# Patient Record
Sex: Female | Born: 1935 | Race: White | Hispanic: No | Smoking: Former smoker
Health system: Southern US, Community
[De-identification: ages and names within clinical notes are randomized; demographics above are authoritative.]

## PROBLEM LIST (undated history)

## (undated) DIAGNOSIS — E039 Hypothyroidism, unspecified: Secondary | ICD-10-CM

## (undated) DIAGNOSIS — K219 Gastro-esophageal reflux disease without esophagitis: Secondary | ICD-10-CM

## (undated) DIAGNOSIS — I10 Essential (primary) hypertension: Secondary | ICD-10-CM

## (undated) DIAGNOSIS — T7840XA Allergy, unspecified, initial encounter: Secondary | ICD-10-CM

## (undated) DIAGNOSIS — I700 Atherosclerosis of aorta: Secondary | ICD-10-CM

## (undated) DIAGNOSIS — I341 Nonrheumatic mitral (valve) prolapse: Secondary | ICD-10-CM

## (undated) DIAGNOSIS — H269 Unspecified cataract: Secondary | ICD-10-CM

## (undated) DIAGNOSIS — M1990 Unspecified osteoarthritis, unspecified site: Secondary | ICD-10-CM

## (undated) HISTORY — DX: Unspecified osteoarthritis, unspecified site: M19.90

## (undated) HISTORY — PX: FRACTURE SURGERY: SHX138

## (undated) HISTORY — PX: EYE SURGERY: SHX253

## (undated) HISTORY — DX: Essential (primary) hypertension: I10

## (undated) HISTORY — PX: COSMETIC SURGERY: SHX468

## (undated) HISTORY — PX: CATARACT EXTRACTION W/ INTRAOCULAR LENS IMPLANT: SHX1309

## (undated) HISTORY — PX: EYE MUSCLE SURGERY: SHX370

## (undated) HISTORY — DX: Allergy, unspecified, initial encounter: T78.40XA

## (undated) HISTORY — DX: Unspecified cataract: H26.9

---

## 1997-07-14 ENCOUNTER — Emergency Department (HOSPITAL_COMMUNITY): Admission: EM | Admit: 1997-07-14 | Discharge: 1997-07-14 | Payer: Self-pay | Admitting: Emergency Medicine

## 1998-12-19 ENCOUNTER — Ambulatory Visit (HOSPITAL_COMMUNITY): Admission: RE | Admit: 1998-12-19 | Discharge: 1998-12-19 | Payer: Self-pay | Admitting: Gastroenterology

## 2000-02-27 ENCOUNTER — Encounter: Admission: RE | Admit: 2000-02-27 | Discharge: 2000-02-27 | Payer: Self-pay | Admitting: Emergency Medicine

## 2000-02-27 ENCOUNTER — Encounter: Payer: Self-pay | Admitting: Emergency Medicine

## 2001-05-28 ENCOUNTER — Encounter: Payer: Self-pay | Admitting: Emergency Medicine

## 2001-05-28 ENCOUNTER — Encounter: Admission: RE | Admit: 2001-05-28 | Discharge: 2001-05-28 | Payer: Self-pay | Admitting: Emergency Medicine

## 2003-04-03 ENCOUNTER — Ambulatory Visit (HOSPITAL_COMMUNITY): Admission: RE | Admit: 2003-04-03 | Discharge: 2003-04-03 | Payer: Self-pay | Admitting: Gastroenterology

## 2003-04-03 ENCOUNTER — Encounter (INDEPENDENT_AMBULATORY_CARE_PROVIDER_SITE_OTHER): Payer: Self-pay | Admitting: *Deleted

## 2003-08-15 ENCOUNTER — Encounter: Admission: RE | Admit: 2003-08-15 | Discharge: 2003-08-15 | Payer: Self-pay | Admitting: Emergency Medicine

## 2004-09-13 ENCOUNTER — Observation Stay (HOSPITAL_COMMUNITY): Admission: RE | Admit: 2004-09-13 | Discharge: 2004-09-14 | Payer: Self-pay | Admitting: Urology

## 2005-04-15 ENCOUNTER — Encounter: Admission: RE | Admit: 2005-04-15 | Discharge: 2005-04-15 | Payer: Self-pay | Admitting: Emergency Medicine

## 2005-08-12 ENCOUNTER — Encounter (INDEPENDENT_AMBULATORY_CARE_PROVIDER_SITE_OTHER): Payer: Self-pay | Admitting: *Deleted

## 2005-08-13 ENCOUNTER — Inpatient Hospital Stay (HOSPITAL_COMMUNITY): Admission: RE | Admit: 2005-08-13 | Discharge: 2005-08-17 | Payer: Self-pay | Admitting: Urology

## 2005-08-22 ENCOUNTER — Ambulatory Visit (HOSPITAL_COMMUNITY): Admission: RE | Admit: 2005-08-22 | Discharge: 2005-08-22 | Payer: Self-pay | Admitting: Urology

## 2006-01-22 ENCOUNTER — Encounter (INDEPENDENT_AMBULATORY_CARE_PROVIDER_SITE_OTHER): Payer: Self-pay | Admitting: Specialist

## 2006-01-22 ENCOUNTER — Ambulatory Visit (HOSPITAL_COMMUNITY): Admission: RE | Admit: 2006-01-22 | Discharge: 2006-01-23 | Payer: Self-pay | Admitting: Urology

## 2006-01-30 ENCOUNTER — Ambulatory Visit (HOSPITAL_COMMUNITY): Admission: RE | Admit: 2006-01-30 | Discharge: 2006-01-30 | Payer: Self-pay | Admitting: Urology

## 2006-07-06 ENCOUNTER — Encounter: Admission: RE | Admit: 2006-07-06 | Discharge: 2006-07-06 | Payer: Self-pay | Admitting: Gastroenterology

## 2006-07-22 ENCOUNTER — Encounter: Admission: RE | Admit: 2006-07-22 | Discharge: 2006-07-22 | Payer: Self-pay | Admitting: Emergency Medicine

## 2007-02-24 ENCOUNTER — Encounter: Admission: RE | Admit: 2007-02-24 | Discharge: 2007-02-24 | Payer: Self-pay | Admitting: Orthopedic Surgery

## 2007-02-25 ENCOUNTER — Ambulatory Visit (HOSPITAL_BASED_OUTPATIENT_CLINIC_OR_DEPARTMENT_OTHER): Admission: RE | Admit: 2007-02-25 | Discharge: 2007-02-26 | Payer: Self-pay | Admitting: Orthopedic Surgery

## 2007-09-22 ENCOUNTER — Encounter: Admission: RE | Admit: 2007-09-22 | Discharge: 2007-09-22 | Payer: Self-pay | Admitting: Emergency Medicine

## 2009-06-23 ENCOUNTER — Ambulatory Visit: Payer: Self-pay | Admitting: Family Medicine

## 2009-06-23 ENCOUNTER — Observation Stay (HOSPITAL_COMMUNITY): Admission: EM | Admit: 2009-06-23 | Discharge: 2009-06-23 | Payer: Self-pay | Admitting: Emergency Medicine

## 2010-02-25 ENCOUNTER — Encounter: Payer: Self-pay | Admitting: Family Medicine

## 2010-04-22 LAB — COMPREHENSIVE METABOLIC PANEL
ALT: 18 U/L (ref 0–35)
AST: 20 U/L (ref 0–37)
Albumin: 3.4 g/dL — ABNORMAL LOW (ref 3.5–5.2)
Alkaline Phosphatase: 42 U/L (ref 39–117)
BUN: 13 mg/dL (ref 6–23)
CO2: 26 mEq/L (ref 19–32)
Calcium: 9.1 mg/dL (ref 8.4–10.5)
Chloride: 108 mEq/L (ref 96–112)
Creatinine, Ser: 0.8 mg/dL (ref 0.4–1.2)
GFR calc Af Amer: >60 mL/min (ref >60)
GFR calc non Af Amer: >60 mL/min (ref >60)
Glucose, Bld: 102 mg/dL — ABNORMAL HIGH (ref 70–99)
Potassium: 3.8 mEq/L (ref 3.5–5.1)
Sodium: 141 mEq/L (ref 135–145)
Total Bilirubin: 0.5 mg/dL (ref 0.3–1.2)
Total Protein: 5.9 g/dL — ABNORMAL LOW (ref 6.0–8.3)

## 2010-04-22 LAB — DIFFERENTIAL
Basophils Absolute: 0 10*3/uL (ref 0.0–0.1)
Basophils Relative: 0 % (ref 0–1)
Eosinophils Absolute: 0.1 10*3/uL (ref 0.0–0.7)
Eosinophils Relative: 1 % (ref 0–5)
Lymphs Abs: 2.6 10*3/uL (ref 0.7–4.0)
Monocytes Absolute: 0.7 10*3/uL (ref 0.1–1.0)
Monocytes Relative: 9 % (ref 3–12)
Neutro Abs: 4.7 10*3/uL (ref 1.7–7.7)

## 2010-04-22 LAB — CBC
HCT: 35.8 % — ABNORMAL LOW (ref 36.0–46.0)
Hemoglobin: 12.3 g/dL (ref 12.0–15.0)
MCHC: 34.4 g/dL (ref 30.0–36.0)
MCV: 90.6 fL (ref 78.0–100.0)
MCV: 91.1 fL (ref 78.0–100.0)
Platelets: 194 10*3/uL (ref 150–400)
RBC: 3.93 MIL/uL (ref 3.87–5.11)
RDW: 13.6 % (ref 11.5–15.5)
RDW: 13.6 % (ref 11.5–15.5)
WBC: 6.1 10*3/uL (ref 4.0–10.5)

## 2010-04-22 LAB — LIPID PANEL
Cholesterol: 153 mg/dL (ref 0–200)
HDL: 58 mg/dL (ref >39)
LDL Cholesterol: 82 mg/dL (ref 0–99)
Total CHOL/HDL Ratio: 2.6 RATIO
Triglycerides: 66 mg/dL (ref <150)
VLDL: 13 mg/dL (ref 0–40)

## 2010-04-22 LAB — CARDIAC PANEL(CRET KIN+CKTOT+MB+TROPI)
CK, MB: 0.9 ng/mL (ref 0.3–4.0)
CK, MB: 1 ng/mL (ref 0.3–4.0)
Relative Index: INVALID (ref 0.0–2.5)
Relative Index: INVALID (ref 0.0–2.5)
Total CK: 46 U/L (ref 7–177)
Total CK: 50 U/L (ref 7–177)
Troponin I: 0.01 ng/mL (ref 0.00–0.06)
Troponin I: 0.01 ng/mL (ref 0.00–0.06)

## 2010-04-22 LAB — CK TOTAL AND CKMB (NOT AT ARMC): CK, MB: 1 ng/mL (ref 0.3–4.0)

## 2010-04-22 LAB — TSH: TSH: 4.679 u[IU]/mL — ABNORMAL HIGH (ref 0.350–4.500)

## 2010-04-22 LAB — POCT I-STAT, CHEM 8
BUN: 18 mg/dL (ref 6–23)
Creatinine, Ser: 0.8 mg/dL (ref 0.4–1.2)
Glucose, Bld: 92 mg/dL (ref 70–99)
Potassium: 3.5 mEq/L (ref 3.5–5.1)
Sodium: 139 mEq/L (ref 135–145)

## 2010-04-22 LAB — HEMOGLOBIN A1C
Hgb A1c MFr Bld: 5.8 % — ABNORMAL HIGH (ref <5.7)
Mean Plasma Glucose: 120 mg/dL — ABNORMAL HIGH (ref <117)

## 2010-04-22 LAB — BRAIN NATRIURETIC PEPTIDE: Pro B Natriuretic peptide (BNP): <30 pg/mL (ref 0.0–100.0)

## 2010-04-22 LAB — POCT CARDIAC MARKERS: Myoglobin, poc: 49.5 ng/mL (ref 12–200)

## 2010-04-22 LAB — D-DIMER, QUANTITATIVE: D-Dimer, Quant: 0.33 ug/mL-FEU (ref 0.00–0.48)

## 2010-06-18 NOTE — Op Note (Signed)
NAME:  Vanessa Melton, Vanessa Melton      ACCOUNT NO.:  [id number]   MEDICAL RECORD NO.:  [id number]          PATIENT TYPE:  AMB   LOCATION:  DSC                          FACILITY:  [facility]   PHYSICIAN:  Vanessa Melton, M.D.DATE OF BIRTH:  [birth year]   DATE OF PROCEDURE:  DATE OF DISCHARGE:                               OPERATIVE REPORT   PREOPERATIVE DIAGNOSIS:  1. Left thumb carpometacarpal joint degenerative disease with failure      of conservative management.  2. Chronic pain deformity.   POSTOPERATIVE DIAGNOSIS:  1. Left thumb carpometacarpal joint degenerative disease with failure      of conservative management.  2. Chronic pain deformity.   PROCEDURE:  1. Left thumb carpometacarpal arthroplasty (removal of the trapezium      at the base of the thumb joint region.  2. Abductor pollicus longus one-third proper portion tendon Melton      to the FCR, APL __________  back upon itself with multiple figure-      of-eight __________  Vanessa Melton) left basal thumb      joint.  3. Abductor pollicus longus digastric portion tendon Melton to the      first metacarpal FCR back upon itself (Zancolli tendon Melton)      left __________  thumb joint.  4. Abductor pollicus longus tenodesis (shortening of her wrist      extensor __________  to prevent dorsolateral escape).  Left basal      thumb/wrist region.   SURGEON:  Vanessa Melton, M.D.   ASSISTANT:  Vanessa Melton   COMPLICATIONS:  None.   ANESTHESIA:  General.   TARGET TIME:  An hour.   INDICATIONS FOR PROCEDURE:  The patient is a 77 year old female who  presents with the above-mentioned diagnoses.  __________  surgery risks  and benefits of surgery including the risk of bleeding, anesthesia,  damage to __________  failure of surgery to accomplish its intended  goals, bleeding symptoms, __________  function.  With this in mind, she  desires to proceed.  All questions have been encouraged and answered  preoperatively.   OPERATIVE PROCEDURE:  The patient was __________  anesthesia, __________  operating suite.  Permit was signed.  She was counseled in the holding  area and preoperative antibiotics were given.  Following this, the  patient then underwent oscillation of sterile field.  Thorough prep and  drape was accomplished about the left upper extremity __________ .  The  patient had an incision made dorsal radially.  Dissection was carried  down.  Superficial radial nerve and its branches were protected.  The  radial artery was identified and protected.  Interval between the APL  and EPV was created.  Capsule was incised.  The patient had a [name] on either side of the trapezium, and following this, the  trapezium was excised piecemeal.  I then made a hole dorsal to palmar  extending interarticularly in line with the palmar big ligament.  This  was enlarged as necessary and then irrigated copiously.  FCR tenolysis  and tenosynovectomy was carried out in the base of the wound, and  following  this, the arthroplasty portion of the procedure was complete.   Once this was done, I made a small incision dorsal radially about the  distal third of the forearm, dissected down and harvested the digastric  portion of the APL and a one-third slip of the APL  Proper.  Following this, these were retrieved distally, and I then  performed tendon Melton of the APL digastric portion through the first  metacarpal drill hole dorsal palmarly  and extending around the FCR.  I  swept it around the FCR twice and then back through itself dorsally.  This was then set with 3-0 fiber wire and completed the Zancolli tendon  Melton.   Following this, the one-third proper portion of the APL  Was placed around the FCR and then back up through the APL proper with  multiple figure-of-eight __________  and was then set with fiber wire of  the 3-0 variety.  This completed the Vanessa Melton tendon  Melton.   I irrigated it copiously and then performed APL tenodesis which was  shortening of her wrist extensor __________  to prevent dorsolateral  escape.  The patient was then set with fiber wire, and this was  accomplished without difficulty.  I then performed a complex capsular  closure with fiber wire and irrigated it copiously with tourniquet down.  Target time was less than an hour.  There were no complicating features,  no MCP hyperextension, and no complicating features with the surgical  intervention.  We did use approximately 30 cc of a mixture of  Sensorcaine without epinephrine and lidocaine with epinephrine for  postoperative anesthesia.  She did not want to have a block and  discussed this with Vanessa Melton, her anesthesiologist.  The patient  has had an allergic reaction to a NOVOCAIN TYPE DERIVATIVE, but has been  able to tolerate the lidocaine and Sensorcaine in the past well  according to our report.   She will be spending the night for IV antibiotics, general observation,  pain control, and will return to the __________  in 10 to 12 days and  proceed with our standard Zancolli protocol.  It has been an absolute  pleasure to see Vanessa Melton.  All questions have been encouraged and  answered.           ______________________________  Vanessa Melton, M.D.     Vanessa Melton  D:  02/25/2007  T:  02/25/2007  Job:  [id number]

## 2010-06-21 NOTE — Op Note (Signed)
NAMECINCERE, DEPREY          ACCOUNT NO.:  [id number]   MEDICAL RECORD NO.:  [id number]          PATIENT TYPE:  AMB   LOCATION:  DAY                          FACILITY:  [facility]   PHYSICIAN:  Martina Sinner, MD DATE OF BIRTH:  06-11-1932   DATE OF PROCEDURE:  08/12/2005  DATE OF DISCHARGE:                                 OPERATIVE REPORT   SURGEON:  Leighton Roach McDiarmid, M.D.   ASSISTANT:  ________   PREOPERATIVE DIAGNOSIS:  Eroded synthetic sling in bladder.   POSTOPERATIVE DIAGNOSIS:  Eroded synthetic sling in bladder.   SURGERY:  Transabdominal removal of eroded sling.   INDICATIONS:  Ms. Lascala has an eroded sling on the left side at 3 o'clock  and also next to the bladder neck.  I felt that it was best managed  transabdominally.   DESCRIPTION OF PROCEDURE:  The patient was placed in the supine position and  mild Trendelenburg.  A Pfannenstiel incision was made in her skin crease  dissecting down to rectus fascia.  Rectus fascia was incised and opened  laterally.  I mobilized the rectus fascia from the rectus muscle allowing  easy exposure to the midline.  The retropubic space was easily entered in  the extraperitoneal fashion.  A small ring Bookwalter retractor was utilized  for exposure.  Though I could not palpate sling, I incised what may have  been sling on the patient's right side and left side to enter the retropubic  space and to identify the bladder neck and palpable Foley balloon.  A 16-  French Foley catheter was initially inserted utilizing sterile technique  since the bladder was eventually going to be open.   The bladder was very thin and easily opened and I used approximately eight 3-  0 silk stay sutures for traction.  The bladder was opened approximately 8 cm  in length in the midline down to approximately 2 cm from the bladder neck.  Some cephalad retraction and dependent retraction using a sponge stick also  helped identify the trigone and eroded  sling on the patient's left side as  described above.  One could also palpate the sling within the very thin  bladder wall.  The ureteral orifices were easily identified.  The left  ureteral orifice was easily cannulated with a 5-French feeding tube.  The  sling was approximately 3 cm lateral to the left ureteral orifice.   Based upon the position of the sling I felt it was best to divide the  bladder wall 90 degrees to the first incision down to the sling and I  circumferentially excised the sling using cutting current and traced it down  to the sling exposed to the bladder neck.  I was very happy with the removal  of the sling.  Hemostasis was good.  I then closed the 90 degree incision in  a two-layer fashion with a 2-0 Vicryl stay suture.  Extra care was taken not  to get too lateral and posterior to injure bowel or ureter.  There was  efflux of indigo carmine from both ureteral orifices well after the bladder  incision was  closed approximately 30% of its length.  After closing the  initial short incision, I closed the anterior bladder wall incision in a two-  layer fashion using a 2-0 Vicryl.  Extra care was taken not to pick up a  ureter or the posterior wall of the bladder.  The bladder was very thin, but  I was happy with the closure.  Before completely closing the incision, I  changed with sterile technique the 16-French catheter to a 20-French  catheter to prevent problems with clot retention.  I then filled the bladder  with saline, and there was no leaks.  The bladder was emptied.   Hemostasis was excellent.  The retropubic space was dry.  A #15-French Harrison Mons  drain was brought in through a short stab incision in the right lower  quadrant into the retropubic space.  There was no bleeding associated with  this.  A 0 silk suture was utilized to sew in a drain in the retropubic  space.   I then reapproximated the rectus muscle with four interrupted 3-0 Vicryl.  I  then closed  the patient's fascia from each side to the midline with a  running 0 Vicryl suture taking extra care to pick up all layers of the  femoral fascia.  I was happy with the closure.  Some irrigation was utilized  in the retrograde space and the subcutaneous tissue with double antibiotic.  Skin staples were used for the skin.  The Foley catheter was draining well  at the end of the case.  Total blood loss was estimated to be approximately  100 mL.   The patient will be kept on aggressive anticholinergics and bladder spasm  regimen postoperatively.  I will an eye on the electrolytes.  She will have  a Foley catheter for approximately 7 days, and I will remove it after a  cystogram.           ______________________________  Martina Sinner, MD  Electronically Signed     SAM/MEDQ  D:  08/12/2005  T:  08/12/2005  Job:  [id number]

## 2010-06-21 NOTE — H&P (Signed)
Vanessa Melton, Vanessa Melton          ACCOUNT NO.:  [id number]   MEDICAL RECORD NO.:  [id number]          PATIENT TYPE:  AMB   LOCATION:  DAY                          [facility]AC[facility]   PHYSICIAN:  Martina Sinner, MD DATE OF BIRTH:  [birth year]   DATE OF ADMISSION:  08/12/2005  DATE OF DISCHARGE:                                HISTORY & PHYSICAL   CHIEF COMPLAINT:  Recurrent urinary tract infections.   HISTORY OF PRESENT ILLNESS:  Vanessa Melton was seen in consultation by Dr.  McDiarmid for treatment of urinary tract infections.  She does have a  history of support sling and cystourethropexy in August 2006.  She initially  was doing well postoperatively.  However, she did have recurrent UTIs with  asymptomatic bacteriuria.  She does have some dysuria and frequency in  response to __________ antibiotics.  __________ did reveal erosion of the  sling at the 3 o'clock position and the bladder neck position.  After  extensive counseling, the patient elected for surgical removal.  Prior to  her sling surgery, she leaked with coughing and sneezing.  She currently  prior to today's admission was continent.  She wears about 1-2 pads a day.  She voids every 2-3 hours, with good flow.  She is not diabetic.  The  patient does desire hysterectomy for some gynecological issues that she is  undergoing.  The patient does have chronic constipation requiring laxatives.  Otherwise, her past medical history, past surgical history, family history,  social history, and review of systems have been documented in the [facility] medical history sheet.   PHYSICAL EXAMINATION:  VITAL SIGNS:  The patient is afebrile, vital signs  stable.  She is not in acute distress.  She is awake, alert, and oriented.  LUNGS:  She has clear breath sounds bilaterally.  HEART:  She is regular rate and rhythm.  ABDOMEN:  Soft, nontender.  No organs or masses.  NEUROLOGIC:  Nonfocal.  PELVIC:  Her bladder neck was well  supported.  She has no stress  incontinence.  She had a grade 2 rectocele that was fairly distal.   Cystoscopy did reveal sling erosion at the 3 o'clock position and at the  bladder neck position.  Bladder scan revealed a residual of 1 cc.   ASSESSMENT AND PLAN:  This is a 77 year old lady with an eroded sling.  After extensive counseling, the patient elected for surgical removal of the  sling.  A transabdominal approach will be performed.  Following surgery, the  patient will be admitted to the hospital for further postop care.     ______________________________  Vanessa Melton, M.D.      Martina Sinner, MD  Electronically Signed    SK/MEDQ  D:  08/12/2005  T:  08/12/2005  Job:  [id number]

## 2010-06-21 NOTE — Procedures (Signed)
[place]  Patient:    Vanessa Melton                MRN: [id number] Proc. Date: 12/19/98 Adm. Date:  [id number] Attending:  Charna Elizabeth CC:         Reuben Likes, M.D.                           Procedure Report  DATE OF BIRTH:  May 30, 1932  REFERRING PHYSICIAN:  Reuben Likes, M.D.  PROCEDURE PERFORMED:  Flexible sigmoidoscopy with biopsies.  ENDOSCOPIST:  Anselmo Rod, M.D.  INSTRUMENT USED:  Olympus video colonoscope.  INDICATIONS:  Screening flexible sigmoidoscopy being done in a 77 year old white female.  Rule out polyps, AVMs, masses, hemorrhoids, etc.  PREPROCEDURE PREPARATION:  Informed consent was procured from the patient.  The  patient was fasted for 8 hours prior to the procedure and prepped with two Fleets enemas the morning of the procedure.  PREPROCEDURE PHYSICAL:  Patient has stable vital signs.  NECK: Supple.  CHEST:  Clear to auscultation. S1, S2 regular.  ABDOMEN:  Soft with normal abdominal bowel sounds.  DESCRIPTION OF PROCEDURE:  The patient was placed in left lateral decubitus position and once the patient was adequately positioned, the Olympus video colonoscope is advanced from the rectum to 60 cm with difficulty secondary to large amount of residual stool in the colon.  However, multiple washes were done. The patient had early diverticular disease in the left colon.  Small nonbleeding internal and external hemorrhoids were also seen.  A small diminutive polyp was  removed at 40 cm and sent for analysis by pathology.  The patient tolerated the  procedure well without complication.  IMPRESSION: 1. Small nonbleeding internal and external hemorrhoids. 2. Few early left-sided diverticular pockets. 3. Small diminutive polyp, biopsied at 40 cm by cold biopsy forceps. 4. Otherwise normal appearing colon.  RECOMMENDATIONS: 1. Await pathology results. 2. Patient is advised to increase the  fluid and fiber in her diet. 3. Outpatient follow-up in the next two weeks. DD:  12/19/98 TD:  12/19/98 Job: [id number]

## 2010-06-21 NOTE — Discharge Summary (Signed)
[name], [name]             ACCOUNT NO.:  [id number]   MEDICAL RECORD NO.:  [id number]          PATIENT TYPE:  INP   LOCATION:  1404                         FACILITY:  [facility]   PHYSICIAN:  Martina Sinner, MD DATE OF BIRTH:  May 31, 1932   DATE OF ADMISSION:  08/13/2005  DATE OF DISCHARGE:  08/17/2005                                 DISCHARGE SUMMARY   ADMITTING DIAGNOSIS:  Recurrent urinary tract infections with eroded  synthetic sling in bladder.   DISCHARGE DIAGNOSIS:  Recurrent urinary tract infections with eroded  synthetic sling in bladder.   PROCEDURE DONE DURING THIS HOSPITAL STAY:  Transabdominal removal of eroded  sling.   HISTORY OF PRESENT ILLNESS AND HOSPITAL COURSE:  Vanessa Melton is a 77 year old  female who was seen and evaluated by Dr. McDiarmid for treatment of  recurrent urinary tract infections.  She had a history of a SPARC sling and  cystourethropexy done in August of 2006.  Postoperatively, the patient  continued to have recurrent UTIs with asymptomatic bacteruria.  A repeat  cystoscopy in the office did reveal sling erosion into the bladder at the 3  o'clock bladder neck position.  __________ the patient elected for surgical  excision of the eroded sling.   The patient was admitted to the hospital on August 13, 2005 where she  successfully underwent removal of the eroded sling.  Postoperatively, the  patient had a JP drain and a Foley catheter.  The Foley did have initially  some bloody urine; however, that cleared up nicely over the next few days.  __________ was noted to be elevated the first 2 days and was consistent with  the urine when JP creatinine was tested.  However, over the next few days,  the JP output subsided, and on repeat biochemical analysis of the JP  drainage was consistent with serum levels.  At that time, the JP drain was  then removed, but the patient was sent home with a Foley catheter.  During  her postoperative stay, the patient  remained hemodynamically stable  throughout.  She did not have any nausea or vomiting, was tolerating a p.o.  diet normally.  The patient was then discharged on postoperative day #4.  On  discharge, the patient was afebrile, her vital signs were stable.  She was  in no acute distress.  Her abdomen was soft and nontender.  The wound was  clean, dry, and intact with no evidence of drainage, erythema or dehiscence.  Her Foley was draining clear urine.  The patient was being discharged to  home.  She was given a prescription of Percocet and a stool softener.  She  was advised to continue her home medications.  The patient is to followup  with Dr. Sherron Monday in about 7-10 days for removal of her Foley catheter at  that time.  Should the patient have any abdominal pain, nausea, vomiting,  wound redness, dehiscence or Foley catheter problems, she is to call us or  come to the emergency room.     ______________________________  Cornelious Bryant, MD      Martina Sinner, MD  Electronically Signed    SK/MEDQ  D:  08/20/2005  T:  08/20/2005  Job:  [id number]

## 2010-06-21 NOTE — Op Note (Signed)
NAME:  Vanessa Melton, Vanessa Melton                ACCOUNT NO.:  [id number]   MEDICAL RECORD NO.:  [id number]                   PATIENT TYPE:  AMB   LOCATION:  ENDO                                 FACILITY:  [facility]   PHYSICIAN:  Anselmo Rod, M.D.               DATE OF BIRTH:  [birth year]   DATE OF PROCEDURE:  04/03/2003  DATE OF DISCHARGE:                                 OPERATIVE REPORT   PROCEDURE PERFORMED:  Colonoscopy with cold biopsies times two.   ENDOSCOPIST:  Charna Elizabeth, M.D.   INSTRUMENT USED:  Olympus video colonoscope.   INDICATIONS FOR PROCEDURE:  The patient is a 77 year old white female with  family history of stage IV colon cancer in her mother and personal history  of colonic polyps (adenomatous) removed in the past.  Rule out recurrent  polyps.   PREPROCEDURE PREPARATION:  Informed consent was procured from the patient.  The patient was fasted for eight hours prior to the procedure and prepped  with a bottle of magnesium citrate and a gallon of GoLYTELY the night prior  to the procedure.   PREPROCEDURE PHYSICAL:  The patient had stable vital signs.  Neck supple.  Chest clear to auscultation.  S1 and S2 regular.  Abdomen soft with normal  bowel sounds.   DESCRIPTION OF PROCEDURE:  The patient was placed in left lateral decubitus  position and sedated with 70 mg of Demerol and 7 mg of Versed intravenously.  Once the patient was adequately sedated and maintained on low flow oxygen  and continuous cardiac monitoring, the Olympus video colonoscope was  advanced from the rectum to the cecum.  The appendicular orifice and  ileocecal valve were clearly visualized and photographed.  A small sessile  polyp was snared from the cecal base.  The rest of the colonic mucosa  appeared healthy.  Retroflexion in the rectum revealed no abnormalities.  The patient tolerated the procedure well without complication.  She had some  discomfort experienced with insufflation of air into  the colon indicating a  component visceral hypersensitivity, questionable irritable bowel syndrome.   IMPRESSION:  1. Small sessile polyp snared from cecal base but otherwise normal     colonoscopy.  2. Abdominal discomfort with insufflation of air into the colon questionable     irritable bowel syndrome.   RECOMMENDATIONS:  1. Await pathology results.  2. Avoid all nonsteroidals for two weeks.  3. Repeat colorectal cancer screening is recommended depending on pathology     results.  4. Outpatient followup as need arises in the future.                                               Anselmo Rod, M.D.    JNM/MEDQ  D:  04/03/2003  T:  04/03/2003  Job:  [id number]   cc:  Reuben Likes, M.D.  [address]  [place]  [place] [address]  Fax: [phone]

## 2010-06-21 NOTE — Op Note (Signed)
NAME:  Vanessa Melton             ACCOUNT NO.:  [id number]   MEDICAL RECORD NO.:  [id number]          PATIENT TYPE:  OIB   LOCATION:  1401                         FACILITY:  [facility]   PHYSICIAN:  Martina Sinner, MD DATE OF BIRTH:  06-Jul-1932   DATE OF PROCEDURE:  01/22/2006  DATE OF DISCHARGE:  01/23/2006                               OPERATIVE REPORT   SURGEON:  Martina Sinner, MD   ASSISTANT:  Claudette Laws, M.D.   PREOP DIAGNOSIS:  1. Eroded synthetic sling at the bladder neck.  2. Stress incontinence.   SURGERY:  1. Transurethral removal of synthetic sling at bladder neck.  2. Transobturator tape (Monarch) sling cystourethropexy plus      cystoscopy.   Vanessa Melton had a sling several months ago; and was dry.  She was  found to have an eroded sling in the left lateral wall; and at the  urethrovesical angle on the left side.  She underwent an open procedure.  I was very cognizant that the sling was near the urethra down at the  level of the urethrovesical angle.  I did a lot of dissection and  visibly and palpably thought I had separated sling from the urethra and  bladder neck and bladder to prevent future erosion.  Her bladder was  very thin; and this has been previously dictated.  She was cystoscoped  by Dr. Etta Grandchild and had 2 strands of probable sling or suture at  approximately 5 o'clock.  Her stress incontinence returned.   She was here for a transobturator tape sling and transurethral removal  of foreign body (Holmium laser plus transurethral resection). We did a  transobturator tape sling.   The patient was prepped and draped in the usual fashion.  She was given  preoperative ciprofloxacin.  The initial procedure was done with a 21  and then a 25-French cystoscope.  We initially used the holmium laser to  remove some of the fragments.  It was difficult the purchase the 0.375  and 0.5 fiber on the because we would lose visibility in the urethra  because  of the angle and shortness of the urethra.  We also tried  grasping forceps and endoscopic scissors; and were not successful.  I  finally decided to use a resectoscope using cutting current and I even  tried cautery.  There is no question that a lot of material was removed  with some soft tissue between 3 and 6 o'clock.  I did not perforate the  bladder or bladder neck.  I did not injure the sphincter distally since  all the dissection was right at the bladder neck.  There was 1 and 2  strands of sling liters wanted to strand of sling still exposed; and I  did not feel that they could be safely resected or treated because they  were a little bit further into the bladder; and the bladder wall looked  a bit thin.  I did not want to perforate.   My goal was to resect foreign body a few millimeters in depth into the  bladder neck wall; and allow  it to reepithelialize over a Foley  catheter.  There was one strand of sling material left that I was  suspect may not reepithelialize; but, again, it was not safe to continue  the maneuvers.   I then cystoscoped the patient to reevaluate; and again the urethra  looked healthy though it was short.  The resection had not did not come  distally into the urethra more than 2 or 3 mm.  The urethra was supple  and soft; and the rest of the bladder was normal.  We then did a  transobturator tape sling.  I made a 2.5 cm incision overlying the short  urethra, making a thick pubocervical flap; and dissected it with  Metzenbaum scissors to the palpable pubic arch bilaterally.  We marked  where the needle would perforate through the obturator foramen; and  could easily palpate the adductor tendon and avoided this.  Using a  Monarch needle it was easily pass and under the pulp of my index finger  bilaterally and brought out through the incision.  Incidentally  epinephrine and Marcaine mixture approximately 8 mL was utilized for  hemostasis; and for the  dissection.   The Monarch sling was attached and brought out through the space  bilaterally.  There was no question that the sling was at the level of  the mid urethra.  I tensioned over the fat part of a medium-size Kelly  in the usual fashion.  I cut below the blue dot to remove the sheath.  I  was very happy with the tension of the mid urethral sling.  I could  hypermobile it in the midline as well as laterally on both sides.   The sling was cut at each groin incision.  Copious irrigation was  utilized.  A two-layer closure with 2-0 Vicryl was used for the vaginal  incision; 4-0 Vicryl was used for the subcuticular skin.  The patient  was then cystoscoped; and there was efflux of indigo carmine from both  ureteral orifices.  I had cystoscoped the patient again after passing it  through the 2 Monarch needles; and there was no injury to the bladder,  urethra, or bladder neck.   I am hoping that [name]' stress incontinence was affecting her  quality of life is cured with the procedure.  I am hoping that the sling  material will completely reepithelialize; and we are going to leave the  Foley in for 1 week; and I thought it was best to get a cystogram in a  week as well.  If she does have exposed sling, I think I will follow her  conservatively.  If she starts any urinary tract infections I will put  her on Macrodantin.  If she ever forms a stone, this can be dealt with  transurethrally.           ______________________________  Martina Sinner, MD  Electronically Signed     SAM/MEDQ  D:  01/22/2006  T:  01/22/2006  Job:  [id number]

## 2010-06-21 NOTE — Op Note (Signed)
Vanessa Melton, Vanessa Melton             ACCOUNT NO.:  [id number]   MEDICAL RECORD NO.:  [id number]          PATIENT TYPE:  AMB   LOCATION:  DAY                          FACILITY:  [facility]   PHYSICIAN:  Claudette Laws, M.D.  DATE OF BIRTH:  March 25, 1932   DATE OF PROCEDURE:  09/13/2004  DATE OF DISCHARGE:                                 OPERATIVE REPORT   PREOPERATIVE DIAGNOSIS:  Stress urinary incontinence.   POSTOPERATIVE DIAGNOSIS:  Stress urinary incontinence.   OPERATION:  1.  Pubovaginal sling procedure [facility]).  2.  Cystoscopy.   SURGEON:  Dr. Etta Grandchild   PROCEDURE:  The patient was prepped and draped in the dorsolithotomy  position under general anesthesia.  A Foley catheter was placed.  A vaginal  speculum was placed, and the labia were sewn back with silk sutures.  A  marking pen was used to outline a vaginal incision on the anterior wall of  about 2 cm approximately halfway between the bladder neck and the urethral  meatus.  Also, a marking pen was used to outline incision suprapubically  about 2 cm lateral to the midline right over the symphysis pubis.  A vaginal  incision was made after injecting the anterior wall with 1% Xylocaine with  100,000 epinephrine both suprapubically and the anterior vaginal wall.  Dissection was carried out with scissor dissection over laterally to the  inferior pubic ramus.  We did not puncture the endopelvic fascia.  Then,  using the [facility] needles, a top-down to the vaginal area approach was made,  hugging the back surface of the symphysis pubis.  Both needles were passed.  Cystoscopy confirmed an inadvertent puncture on the left lateral wall, and  this needle was then withdrawn and then passed again, and cystoscopy again  confirmed no inadvertent bladder wall puncture.  We then attached the [facility]  mesh to the ends of the needles, and these were brought up suprapubically.  A right-angle clamp was used to make sure the sling was not too tight as we  removed the sleeves of the sling.  Bug juice was then passed into the  vaginal area and also down the mesh.  I then recystoscoped her, again to  confirm the fact that there was no mesh in the bladder.  I re-placed the  Foley catheter, and then we sewed up the vaginal incision with interrupted  sutures of 2-0 Vicryl.  We then packed the vaginal area with vaginal packing  imbedded with Bacitracin.  Dermabond was applied to the suprapubic  incisions.  The patient was then taken back to the recovery room in  satisfactory condition.      Claudette Laws, M.D.  Electronically Signed     RFS/MEDQ  D:  09/13/2004  T:  09/13/2004  Job:  [id number]

## 2010-10-24 LAB — BASIC METABOLIC PANEL
CO2: 28
Chloride: 102
Creatinine, Ser: 0.87
GFR calc Af Amer: >60
Potassium: 3.7

## 2011-01-16 ENCOUNTER — Other Ambulatory Visit: Payer: Self-pay | Admitting: Obstetrics and Gynecology

## 2011-09-11 ENCOUNTER — Encounter: Payer: Self-pay | Admitting: Family Medicine

## 2011-09-11 ENCOUNTER — Ambulatory Visit (INDEPENDENT_AMBULATORY_CARE_PROVIDER_SITE_OTHER): Payer: BC Managed Care – PPO | Admitting: Family Medicine

## 2011-09-11 VITALS — BP 103/68 | HR 76 | Temp 98.1°F | Resp 16 | Ht 62.0 in | Wt 177.0 lb

## 2011-09-11 DIAGNOSIS — I10 Essential (primary) hypertension: Secondary | ICD-10-CM

## 2011-09-11 DIAGNOSIS — E039 Hypothyroidism, unspecified: Secondary | ICD-10-CM

## 2011-09-11 DIAGNOSIS — T887XXA Unspecified adverse effect of drug or medicament, initial encounter: Secondary | ICD-10-CM

## 2011-09-11 DIAGNOSIS — E559 Vitamin D deficiency, unspecified: Secondary | ICD-10-CM

## 2011-09-11 LAB — CBC WITH DIFFERENTIAL/PLATELET
Basophils Absolute: 0 10*3/uL (ref 0.0–0.1)
Basophils Relative: 0 % (ref 0–1)
Eosinophils Absolute: 0.1 10*3/uL (ref 0.0–0.7)
MCH: 29.9 pg (ref 26.0–34.0)
MCHC: 34.2 g/dL (ref 30.0–36.0)
Neutro Abs: 2.7 10*3/uL (ref 1.7–7.7)
Neutrophils Relative %: 56 % (ref 43–77)
Platelets: 214 10*3/uL (ref 150–400)
RDW: 13.8 % (ref 11.5–15.5)

## 2011-09-11 MED ORDER — CLONIDINE HCL 0.1 MG/24HR TD PTWK: 1.0000 | MEDICATED_PATCH | TRANSDERMAL | Status: DC

## 2011-09-11 NOTE — Patient Instructions (Addendum)
I have provided some information for you about growth hormone; it advise against use of this substance for treating fatigue.

## 2011-09-11 NOTE — Progress Notes (Signed)
  Subjective:    Patient ID: Vanessa Melton, female    DOB: 07-02-1932, 78 y.o.   MRN: [id number]  HPI  This 78 y.o. Cauc female has HTN and has been taking Lisinopril for years. States she has   had a cough since starting this medication. Wants to try Clonidine Transdermal patch (a friend of  hers is on this medication). She is allergic to Beta-blockers; had problems with chest pain when pre-  scribed eye drops by her eye doctor.         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    Review of Systems  Constitutional: Positive for fatigue. Negative for fever, diaphoresis, appetite change and unexpected weight change.  Respiratory: Negative for cough, chest tightness and shortness of breath.   Cardiovascular: Negative for chest pain, palpitations and leg swelling.  Gastrointestinal: Negative.   Musculoskeletal: Positive for arthralgias. Negative for back pain, joint swelling and gait problem.  Skin: Negative.   Neurological: Negative.   Psychiatric/Behavioral: Negative.        Objective:   Physical Exam  Nursing note and vitals reviewed. Constitutional: She is oriented to person, place, and time. She appears well-developed and well-nourished. No distress.       Appears younger than stated age  HENT:  Head: Normocephalic and atraumatic.  Right Ear: External ear normal.  Left Ear: External ear normal.  Nose: Nose normal.  Mouth/Throat: Oropharynx is clear and moist.  Eyes: Conjunctivae and EOM are normal. Pupils are equal, round, and reactive to light. No scleral icterus.  Neck: Normal range of motion. Neck supple. No thyromegaly present.  Cardiovascular: Normal rate, regular rhythm and normal heart sounds.  Exam reveals no gallop and no friction rub.   No murmur heard. Pulmonary/Chest: Effort normal and breath sounds normal. No respiratory  distress.  Abdominal: She exhibits no distension.  Musculoskeletal: Normal range of motion. She exhibits no edema and no tenderness.  Lymphadenopathy:    She has no cervical adenopathy.  Neurological: She is alert and oriented to person, place, and time. She has normal reflexes. No cranial nerve deficit. Coordination normal.  Skin: Skin is warm and dry.  Psychiatric: She has a normal mood and affect. Her behavior is normal. Judgment and thought content normal.          Assessment & Plan:   1. Unspecified essential hypertension  CBC with Differential, Comprehensive metabolic panel RX: Catapres-TTS-1  #4  2 RFs   0.1 mg/24 hr   Change patch once a week  2. Unspecified vitamin D deficiency  Vitamin D 25 hydroxy  3. Unspecified hypothyroidism  TSH, T4, free  4. Medication side effect - cough due to ACEI Discontinue Lisinopril

## 2011-09-12 LAB — COMPREHENSIVE METABOLIC PANEL
ALT: 14 U/L (ref 0–35)
BUN: 12 mg/dL (ref 6–23)
CO2: 29 mEq/L (ref 19–32)
Calcium: 9.8 mg/dL (ref 8.4–10.5)
Chloride: 101 mEq/L (ref 96–112)
Creat: 0.82 mg/dL (ref 0.50–1.10)
Glucose, Bld: 83 mg/dL (ref 70–99)
Potassium: 4.1 mEq/L (ref 3.5–5.3)
Sodium: 139 mEq/L (ref 135–145)

## 2011-09-12 LAB — VITAMIN D 25 HYDROXY (VIT D DEFICIENCY, FRACTURES): Vit D, 25-Hydroxy: 34 ng/mL (ref 30–89)

## 2011-09-14 NOTE — Progress Notes (Signed)
Quick Note:  Please advise pt that the labs are normal; Vitamin D level is low normal. Get OTC Vitamin D3 2000 IU and take 1 capsule daily. Try to eat more salmon, tuna, mushrooms and Vitamin D -fortified foods and get 10 minutes of sun most days of the week.    Copy to pt. ______

## 2011-09-23 ENCOUNTER — Ambulatory Visit (INDEPENDENT_AMBULATORY_CARE_PROVIDER_SITE_OTHER): Payer: BC Managed Care – PPO | Admitting: Family Medicine

## 2011-09-23 VITALS — BP 110/62 | HR 77 | Temp 98.5°F | Resp 16 | Ht 62.0 in | Wt 148.4 lb

## 2011-09-23 DIAGNOSIS — R42 Dizziness and giddiness: Secondary | ICD-10-CM

## 2011-09-23 DIAGNOSIS — I10 Essential (primary) hypertension: Secondary | ICD-10-CM | POA: Insufficient documentation

## 2011-09-23 DIAGNOSIS — R040 Epistaxis: Secondary | ICD-10-CM

## 2011-09-23 DIAGNOSIS — E039 Hypothyroidism, unspecified: Secondary | ICD-10-CM | POA: Insufficient documentation

## 2011-09-23 DIAGNOSIS — H409 Unspecified glaucoma: Secondary | ICD-10-CM | POA: Insufficient documentation

## 2011-09-23 DIAGNOSIS — G43909 Migraine, unspecified, not intractable, without status migrainosus: Secondary | ICD-10-CM | POA: Insufficient documentation

## 2011-09-23 LAB — POCT CBC
Hemoglobin: 13.2 g/dL (ref 12.2–16.2)
MCH, POC: 29.1 pg (ref 27–31.2)
MPV: 7.5 fL (ref 0–99.8)
POC Granulocyte: 5 (ref 2–6.9)
POC MID %: 6.2 %M (ref 0–12)
RBC: 4.54 M/uL (ref 4.04–5.48)
WBC: 7.7 10*3/uL (ref 4.6–10.2)

## 2011-09-23 NOTE — Patient Instructions (Signed)
Your blood pressure is ok this morning.  Drink plenty of fluids, saline nasal spray few times per day in case there was a dry area that led to a nosebleed. Keep record of blood pressures and follow up with Dr. Audria Nine in 2 days to discuss your meds.  Bring blood pressure cuff to make sure this is reliable. Do not take any further doses of the lisinopril at this point.  If your dizziness or lightheadedness worsens - go to an emergency room or call 911.   If your nosebleed recurs - lean head forward and apply pressure to the sides of your nose for 15 minutes.    Return to the clinic or go to the nearest emergency room if any of your symptoms worsen or new symptoms occur.

## 2011-09-23 NOTE — Progress Notes (Signed)
Subjective:    Patient ID: Vanessa Melton, female    DOB: [birth year], 78 y.o.   MRN: [id number]  HPI Vanessa Melton is a 78 y.o. female Previously seen by Dr. Audria Nine 09/11/11. Switched form lisinopril in past due to cough to clonidine. Has appt in 2 days.   Had elevation in blood pressure this am.  Usually 103/65-110/70.  This morning noticed BP 161/85.  Nose bleed after taking shower - 7:30am, tried pressure, ice pack and leaning back.  Lasted for about 30 minutes  Off an on. No recent URI. Had facelift in May, but no prior nosebleeds.  On clonidine past week.  Applies patch once per week. When) blood pressure went up this morning - she decided to take 20mg  lisinopril at 8:10am.. (prior on lisinopril 20mg  BID). No recent travel. No hx of frequent nosebleeds.  Feels a little dizzy since nosebleed.    Review of Systems  HENT: Positive for nosebleeds.   Respiratory: Negative for shortness of breath.   Cardiovascular: Negative for chest pain.  Neurological: Positive for dizziness and light-headedness (since nose bleed. ). Negative for syncope.       Objective:   Physical Exam  Constitutional: She is oriented to person, place, and time. She appears well-developed and well-nourished.  HENT:  Head: Normocephalic and atraumatic.  Right Ear: External ear normal.  Left Ear: External ear normal.  Nose: No mucosal edema or nasal septal hematoma. No epistaxis. Right sinus exhibits no maxillary sinus tenderness and no frontal sinus tenderness. Left sinus exhibits no maxillary sinus tenderness and no frontal sinus tenderness.    Mouth/Throat: Oropharynx is clear and moist.  Eyes: Pupils are equal, round, and reactive to light.  Cardiovascular: Normal rate, regular rhythm, normal heart sounds and intact distal pulses.   Pulmonary/Chest: Effort normal and breath sounds normal.  Neurological: She is alert and oriented to person, place, and time.  Skin: Skin is warm and dry.  Psychiatric: She  has a normal mood and affect. Her behavior is normal. Thought content normal.   Results for orders placed in visit on 09/23/11  POCT CBC      Component Value Range   WBC 7.7  4.6 - 10.2 K/uL   Lymph, poc 2.2  0.6 - 3.4   POC LYMPH PERCENT 28.4  10 - 50 %L   MID (cbc) 0.5  0 - 0.9   POC MID % 6.2  0 - 12 %M   POC Granulocyte 5.0  2 - 6.9   Granulocyte percent 65.4  37 - 80 %G   RBC 4.54  4.04 - 5.48 M/uL   Hemoglobin 13.2  12.2 - 16.2 g/dL   HCT, POC 16.6  06.3 - 47.9 %   MCV 93.6  80 - 97 fL   MCH, POC 29.1  27 - 31.2 pg   MCHC 31.1 (*) 31.8 - 35.4 g/dL   RDW, POC 01.6     Platelet Count, POC 225  142 - 424 K/uL   MPV 7.5  0 - 99.8 fL       Assessment & Plan:  [name] is a 78 y.o. female   HTN - with slight elevation this am and coinciding epistaxis - now resolved. BP in normal range here.  Self administered 20mg  lisinopril.  Discussed concerns with this as this is not fast acting and may cause her BP to decrease too much on clonidine.  Drink plenty of fluids, saline ns prn. Keep record of BP's and follow up with  Dr. Audria Nine in 2 days to discuss regimen.  ? ARB as option?  bring blood pressure cuff to make sure this is reliable.   Epistaxis - resolved.  Discussed head forward and pressure treatment if recurs.  Rtc/er precautions.

## 2011-09-25 ENCOUNTER — Encounter: Payer: Self-pay | Admitting: Family Medicine

## 2011-09-25 ENCOUNTER — Ambulatory Visit (INDEPENDENT_AMBULATORY_CARE_PROVIDER_SITE_OTHER): Payer: BC Managed Care – PPO | Admitting: Family Medicine

## 2011-09-25 VITALS — BP 150/72 | HR 60 | Temp 98.2°F | Resp 16 | Ht 62.0 in | Wt 149.2 lb

## 2011-09-25 DIAGNOSIS — I10 Essential (primary) hypertension: Secondary | ICD-10-CM

## 2011-09-25 MED ORDER — CLONIDINE HCL 0.2 MG/24HR TD PTWK: 1.0000 | MEDICATED_PATCH | TRANSDERMAL | Status: DC

## 2011-09-25 NOTE — Progress Notes (Signed)
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 She wants to continue with current medication (Catapres-TTS-1 patch) and is agreeable to increasing  the dose. She has only had 1 occasion where the patch came off while she was sleeping. She reapplied the same patch and has had no problem since. She denies HA, dizziness, CP, palpitations, cough, SOB or weakness.  O:   Filed Vitals:   09/25/11 1412  BP: 150/72  Pulse: 60  Temp: 98.2  Resp: 16   GEN: In NAD; WN,WD COR: RRR LUNGS: Normal resp rate and effort NEURO: Nonfocal    A/P: 1. HTN (hypertension)  RX: Catapress-TTS-2  Apply 1 patch and change once a week.

## 2011-10-23 ENCOUNTER — Encounter: Payer: Self-pay | Admitting: Family Medicine

## 2011-10-23 ENCOUNTER — Ambulatory Visit (INDEPENDENT_AMBULATORY_CARE_PROVIDER_SITE_OTHER): Payer: BC Managed Care – PPO | Admitting: Family Medicine

## 2011-10-23 VITALS — BP 148/80 | HR 55 | Temp 97.5°F | Resp 16 | Ht 61.5 in | Wt 148.4 lb

## 2011-10-23 DIAGNOSIS — I10 Essential (primary) hypertension: Secondary | ICD-10-CM

## 2011-10-23 DIAGNOSIS — E663 Overweight: Secondary | ICD-10-CM

## 2011-10-23 NOTE — Progress Notes (Signed)
S:This 78 y.o. Cauc female has HTN and is doing well on Catapres-TTS -2 (generic). She and her friend have a fitness membership and have an appt to me  With a trainer who will help design a plan for them. Her goal is "to lose the muffin top"  and increase her strength. She has a horse she wants to train and she wants to play more golf. 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  ROS: Noncontributory  O:  Filed Vitals:   10/23/11 1042  BP: 148/80  Pulse: 55  Temp: 97.5 F (36.4 C)  Resp: 16  GEN: In NAD; WN,WD. HENT: [place]/AT; EOMI,conj/scl clear. COR: RRR LUNGS: Normal resp rate and effort. NEURO: A&O x 3; CNs intact; otherwise nonfocal.    A/P: 1. HTN (hypertension)   Continue current medication; lifestyle changes with routine physical activity will help pt achieve BP goal <140/80.  2. Overweight (BMI 25.0-29.9)    Pt declines Flu vaccine stating she has always gotten sick in the past. She works from home this winter so she is at low risk for contracting the illness.

## 2011-10-23 NOTE — Patient Instructions (Signed)
I am not changing your medications today; I feel confident that your BP will come down with continued fitness plans and weight loss. I look forward to seeing you in 3 months.

## 2011-11-03 ENCOUNTER — Ambulatory Visit: Payer: BC Managed Care – PPO

## 2011-11-03 ENCOUNTER — Ambulatory Visit (INDEPENDENT_AMBULATORY_CARE_PROVIDER_SITE_OTHER): Payer: BC Managed Care – PPO | Admitting: Family Medicine

## 2011-11-03 VITALS — BP 160/79 | HR 62 | Temp 97.5°F | Resp 18 | Ht 63.5 in | Wt 152.4 lb

## 2011-11-03 DIAGNOSIS — M25559 Pain in unspecified hip: Secondary | ICD-10-CM

## 2011-11-03 DIAGNOSIS — M7060 Trochanteric bursitis, unspecified hip: Secondary | ICD-10-CM

## 2011-11-03 DIAGNOSIS — M76899 Other specified enthesopathies of unspecified lower limb, excluding foot: Secondary | ICD-10-CM

## 2011-11-03 MED ORDER — HYDROCODONE-ACETAMINOPHEN 5-500 MG PO TABS: 1.0000 | ORAL_TABLET | ORAL | Status: DC | PRN

## 2011-11-03 MED ORDER — DICLOFENAC SODIUM 75 MG PO TBEC: 75.0000 mg | DELAYED_RELEASE_TABLET | Freq: Two times a day (BID) | ORAL | Status: DC

## 2011-11-03 NOTE — Patient Instructions (Signed)
Ice Rest  Return for injection if not improving.

## 2011-11-03 NOTE — Progress Notes (Signed)
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  Develoved acute severe pain left hip.  It has continued moderated to often severe today  O: FROM hip.  Very tender greater trochanter  A:  Acute hip pain, prob. Acute busitis  P: Xray  [facility] reading (PRIMARY) by  Dr. Alwyn Ren No fx  Dx Greater trochanteric bursitis  .

## 2011-12-04 ENCOUNTER — Telehealth: Payer: Self-pay

## 2011-12-04 DIAGNOSIS — M7060 Trochanteric bursitis, unspecified hip: Secondary | ICD-10-CM

## 2011-12-04 MED ORDER — DICLOFENAC SODIUM 75 MG PO TBEC: 75.0000 mg | DELAYED_RELEASE_TABLET | Freq: Two times a day (BID) | ORAL | Status: DC

## 2011-12-04 NOTE — Telephone Encounter (Signed)
I have refilled the Voltaren for her (be sure to take this with food) but I do not think it is appropriate to refill the Vicodin.  If the Voltaren is not controlling her pain, she should be seen in clinic as we may need to pursue other interventions or a referral to ortho

## 2011-12-04 NOTE — Telephone Encounter (Signed)
Patient states that she is having trouble with her hip and saw Dr Alwyn Ren for is at 102.  Patient states that she would like to know if she could get a refill on her Pain Medicine.   Best: [phone]   Pharmacy: [facility] on [address]

## 2011-12-04 NOTE — Telephone Encounter (Signed)
I have called her to advise. She will let us know if the Voltaren is not helping.

## 2011-12-25 ENCOUNTER — Ambulatory Visit (INDEPENDENT_AMBULATORY_CARE_PROVIDER_SITE_OTHER): Payer: BC Managed Care – PPO | Admitting: Sports Medicine

## 2011-12-25 VITALS — BP 126/70 | Ht 62.0 in | Wt 148.0 lb

## 2011-12-25 DIAGNOSIS — S76019A Strain of muscle, fascia and tendon of unspecified hip, initial encounter: Secondary | ICD-10-CM

## 2011-12-25 DIAGNOSIS — M25559 Pain in unspecified hip: Secondary | ICD-10-CM

## 2011-12-25 DIAGNOSIS — IMO0002 Reserved for concepts with insufficient information to code with codable children: Secondary | ICD-10-CM

## 2011-12-25 NOTE — Patient Instructions (Addendum)
Thank you for coming in today.    

## 2011-12-26 ENCOUNTER — Encounter: Payer: Self-pay | Admitting: Sports Medicine

## 2011-12-26 NOTE — Progress Notes (Signed)
  Subjective:    Patient ID: Vanessa Melton, female    DOB: 11-09-1932, 78 y.o.   MRN: [id number]  HPI chief complaint: Left hip pain  Very pleasant 78 year old female comes in today complaining of left hip pain. Pain began acutely while swinging a golf club. She felt a sharp stabbing discomfort along the lateral hip with follow-through. She was seen at her primary care physician's office initially and placed on Voltaren. This did help her and her symptoms improved to the point to where she try to return to the driving range one week later. She was working for her bag of clubs she reinjured herself, again on follow-through. She resumed taking the Voltaren and a prescription for Vicodin was also provided for more severe pain. She localizes all the pain to the lateral hip. No pain in the groin. No low back pain. No radiating pain down the leg. No associated numbness or tingling. No problems with his hip in the past.  Her past medical history and medications are reviewed Allergies are reviewed    Review of Systems     Objective:   Physical Exam  well-developed, well-nourished. No acute distress. Awake alert and oriented x3.  Left hip: Smooth painless hip range of motion with a negative log roll. There is exquisite tenderness to palpation just posterior to the greater trochanteric bursa at the insertion point of the hip external rotators. Mild tenderness over the greater trochanteric bursa itself but not marked. There is pain with resisted hip abduction and external rotation. No soft tissue swelling. No palpable defect. No ecchymosis. Negative straight leg raise. Neurovascularly intact distally. Walking with a slight limp.  X-rays of the left hip from September 30 are reviewed. There are minimal degenerative changes. Nothing acute. [facility] ultrasound of the left hip: There is a hypoechoic area in the gluteus medius tendon just proximal to the insertion. This seems to represent a small tear. Minimal  neovascularity. No avulsions are appreciated.       Assessment & Plan:  1. Left hip pain secondary to gluteus medius tendon strain/small tear  I've asked the patient to continue with her Voltaren for 7 more days. She'll start a home exercise program and will limit activities in the gym. She's instructed not to swing a golf club until I reevaluate her in 4 weeks. We will plan on repeating her ultrasound at that time. If she continues to struggle we may consider a topical nitroglycerin trial. I do not believe this area would respond to a cortisone injection and the patient is not interested in this anyway. She will call with questions or concerns prior to her followup visit.

## 2012-01-22 ENCOUNTER — Ambulatory Visit (INDEPENDENT_AMBULATORY_CARE_PROVIDER_SITE_OTHER): Payer: Medicare Other | Admitting: Sports Medicine

## 2012-01-22 ENCOUNTER — Ambulatory Visit (INDEPENDENT_AMBULATORY_CARE_PROVIDER_SITE_OTHER): Payer: BC Managed Care – PPO | Admitting: Family Medicine

## 2012-01-22 ENCOUNTER — Encounter: Payer: Self-pay | Admitting: Family Medicine

## 2012-01-22 VITALS — BP 120/68 | HR 56 | Temp 98.0°F | Resp 16 | Ht 62.0 in | Wt 153.6 lb

## 2012-01-22 VITALS — BP 127/77 | Ht 62.0 in | Wt 148.0 lb

## 2012-01-22 DIAGNOSIS — I10 Essential (primary) hypertension: Secondary | ICD-10-CM

## 2012-01-22 DIAGNOSIS — T50995A Adverse effect of other drugs, medicaments and biological substances, initial encounter: Secondary | ICD-10-CM

## 2012-01-22 DIAGNOSIS — M25559 Pain in unspecified hip: Secondary | ICD-10-CM

## 2012-01-22 DIAGNOSIS — T887XXA Unspecified adverse effect of drug or medicament, initial encounter: Secondary | ICD-10-CM

## 2012-01-22 MED ORDER — DOXAZOSIN MESYLATE 2 MG PO TABS: ORAL_TABLET | ORAL | Status: DC

## 2012-01-22 NOTE — Progress Notes (Signed)
  Subjective:    Patient ID: Vanessa Melton, female    DOB: 10/02/1932, 78 y.o.   MRN: [id number]  HPI Patient comes in today for followup on left hip pain. Lateral left hip pain has improved but not resolved. She took Voltaren for 1 week but did not notice much of a difference in her pain. She did try swinging a golf club and had returning discomfort. Other than this, the only discomfort she gets is an occasional twinge. She has been doing her home exercise program with the exception of hip abductor strengthening.    Review of Systems     Objective:   Physical Exam Well-developed, well-nourished. No acute distress  Left hip: There is still tenderness to palpation at the insertion point on the greater trochanter of the gluteus medius tendon. No soft tissue swelling. She is walking today without a limp.  A very brief [facility] ultrasound of the left hip with focus on the gluteus medius tendon shows some persistent hypoechogenicity at the insertion onto the greater trochanter.       Assessment & Plan:  1. Clinically improving gluteus medius strain left hip  Clinically the patient is improving but I still think this injury is going to take another couple of months before she is able to return to golfing. I considered topical nitroglycerin but she has a history of migraines. She is not interested in cortisone injections. She will continue with her home exercise program and can introduce hip abductor strengthening as tolerated. Topical Aspercreme when necessary for pain. Followup in 4 weeks. We will repeat her ultrasound at that time. Call with questions or concerns in the interim.

## 2012-01-22 NOTE — Progress Notes (Signed)
S: This 78 y.o. Cauc female returns for HTN follow-up. She is pleased with current level of BP control and does not want to consider allowing her BP to run higher (given the new guidelines for over-50 individuals). She is exercising regularly and feels good. She denies HA, dizziness, CP or tightness, SOB, edema, weakness or syncope. Pt c/o rash and itching at spots where Catapres-TTS patch has been applied. She likes the convenience of no oral medication.   ROS: As per HPI.  O:  Filed Vitals:   01/22/12 1048  BP: 120/68  Pulse: 56  Temp: 98 F (36.7 C)  Resp: 16   GEN: In NAD; WN,WD. HENT: [place]/AT. EOMI w/ clear conj/ sclerae. Otherwise unremarkable. NECK: Supple w/o LAN or TMG. COR: RRR. No edema. LUNGS: Normal resp rate and effort.  SKIN: W&D; erythematous circular patches with slight scaliness on upper ext. NEURO: A&O x 3; CNs intact. Nonfocal.  A/P:  1. HTN, goal below 140/80  doxazosin (CARDURA) 2 MG tablet  Take 1/2 tab twice a day for 3 days then go to 1 tablet at bedtime  2. Unspecified adverse effect of other drug, medicinal and biological substance- I spoke w/ pharmacist who states that changing to brand Catapres-TTS will not reduce likelihood of dermatitis D/C Clonidine (Catapres-TTS) patch

## 2012-01-22 NOTE — Patient Instructions (Addendum)
I have discontinued Catapres-TTS patch due to dermatitis.  Your new medication is Doxazosin 2 mg tablets. Take 1/2 tablet twice a day the first 3 days then you can go to 1 tablet at bedtime. Continue to check your BP twice a day. I will see you again in 6 weeks to see how you are doing on this medication.

## 2012-02-09 ENCOUNTER — Ambulatory Visit (INDEPENDENT_AMBULATORY_CARE_PROVIDER_SITE_OTHER): Payer: BC Managed Care – PPO | Admitting: Family Medicine

## 2012-02-09 VITALS — BP 152/88 | HR 93 | Temp 98.4°F | Resp 17 | Ht 62.5 in | Wt 154.0 lb

## 2012-02-09 DIAGNOSIS — Z789 Other specified health status: Secondary | ICD-10-CM

## 2012-02-09 DIAGNOSIS — I10 Essential (primary) hypertension: Secondary | ICD-10-CM

## 2012-02-09 DIAGNOSIS — Z888 Allergy status to other drugs, medicaments and biological substances status: Secondary | ICD-10-CM

## 2012-02-09 MED ORDER — LOSARTAN POTASSIUM 100 MG PO TABS: 100.0000 mg | ORAL_TABLET | Freq: Every day | ORAL | Status: DC

## 2012-02-09 NOTE — Progress Notes (Signed)
Subjective: Patient is having trouble getting adjusted to a appropriate blood pressure medication. She has a history of problems with lisinopril causing a cough, even though it controlled her blood pressure. Beta Blockers caused her to have chest pains and she had to be in the hospital. Doxazosin caused her weight gain. At one point was on HCTZ with potassium added. Probably could tolerate that. It does not sound like she has been on ARB medications or CCB.  Objective: Repeat blood pressure still high at 152/80. Chest clear. Heart regular without murmurs.  Assessment: Hypertension, unsatisfactory control Medication intolerance  Plan: Losartan  100 mg one half tablet daily for 2 weeks then on up to 100 mg daily.

## 2012-02-09 NOTE — Patient Instructions (Signed)
Losartan 100 mg take one half tablet daily for about 2 weeks to see how you're tolerating it before going on up to 100 mg daily.  Let me know if you have problems.

## 2012-02-26 ENCOUNTER — Encounter: Payer: Self-pay | Admitting: Sports Medicine

## 2012-02-26 ENCOUNTER — Ambulatory Visit (INDEPENDENT_AMBULATORY_CARE_PROVIDER_SITE_OTHER): Payer: BC Managed Care – PPO | Admitting: Sports Medicine

## 2012-02-26 VITALS — BP 142/81 | HR 61 | Ht 62.5 in | Wt 154.0 lb

## 2012-02-26 DIAGNOSIS — S76019A Strain of muscle, fascia and tendon of unspecified hip, initial encounter: Secondary | ICD-10-CM

## 2012-02-26 DIAGNOSIS — IMO0002 Reserved for concepts with insufficient information to code with codable children: Secondary | ICD-10-CM

## 2012-02-26 NOTE — Progress Notes (Signed)
  Subjective:    Patient ID: Vanessa Melton, female    DOB: [birth year], 79 y.o.   MRN: [id number]  HPI Vanessa Melton comes in today for followup. Left hip is feeling much better. Minimal pain along the lateral hip. She has been compliant with her home exercises. She has been going through the motions of swinging a golf club but has avoided picking up and swinging an actual club.   Review of Systems     Objective:   Physical Exam Well-developed, well-nourished. No acute distress. Awake alert and oriented x3  Left hip: Smooth painless hip range of motion with a negative log roll. There is still some slight tenderness to palpation at the insertion of the gluteus medius tendon onto the greater trochanter. No soft tissue swelling. Mild pain with resisted hip abduction but good strength. Neurovascular intact distally. Walking without a limp.  MSK ultrasound of the left hip: Limited study of the lateral hip was performed. The previous area of hypoechogenicity seen at the insertion of the gluteus medius tendon onto the greater trochanter is no longer appreciated. There does appear to be some calcification in the area of her previous tear.       Assessment & Plan:  1. Improving left hip pain secondary to gluteus medius tendon strain  Clinically the patient is progressing nicely. However, I do not think she is ready to return to golf. Instead, I have recommended that she continue to concentrate on her home exercises including hip abductor strengthening and hip external rotation. I would like to reevaluate her in 4 weeks. If she is still doing well at that time, then I will consider allowing her a gradual return to golf. Call with questions or concerns in the interim.

## 2012-03-04 ENCOUNTER — Encounter: Payer: Self-pay | Admitting: Family Medicine

## 2012-03-04 ENCOUNTER — Ambulatory Visit (INDEPENDENT_AMBULATORY_CARE_PROVIDER_SITE_OTHER): Payer: BC Managed Care – PPO | Admitting: Family Medicine

## 2012-03-04 VITALS — BP 123/73 | HR 72 | Temp 98.2°F | Resp 16 | Ht 63.5 in | Wt 155.0 lb

## 2012-03-04 DIAGNOSIS — I10 Essential (primary) hypertension: Secondary | ICD-10-CM

## 2012-03-04 NOTE — Progress Notes (Signed)
S: This 79 y.o. Cauc female has HTN and medication change to Doxazosin caused multiple side effects- weight gain, sleep disturbance and "just did not feel well in general". These problems started within 1 week of starting Doxazosin. She was seen at [facility] on 02/09/12 by Dr. Alwyn Ren who changed med to Losartan 100 mg. She feels well and all symptoms have resolved. She has returned to fitness center to resume weight loss program.  ROS: Negative for fatigue, diaphoresis, fever, vision disturbances, CP or tightness, palpitations, SOB or DOE, cough, edema, HA, dizziness, lightheadedness, numbness, weakness or syncope.  O:  Filed Vitals:   03/04/12 1100  BP: 123/73  Pulse: 72  Temp: 98.2 F (36.8 C)  Resp: 16   GNE: In NAD; WN/WD. HENT: [place]/AT; EOMI w/ clear conj and sclerae. Oroph clear and mosit. COR: RRR. LUNGS: Normal resp rate and effort. SKIN: W&D; no rashes or pallor. NEURO: A&O x 3; CNs intact. Nonfocal.  A/P: 1. HTN (hypertension)    Well controlled on Losartan 100 mg  1 tab daily (no side effects)

## 2012-04-15 ENCOUNTER — Ambulatory Visit (INDEPENDENT_AMBULATORY_CARE_PROVIDER_SITE_OTHER): Payer: BC Managed Care – PPO | Admitting: Sports Medicine

## 2012-04-15 VITALS — BP 135/79 | Ht 62.0 in | Wt 150.0 lb

## 2012-04-16 NOTE — Progress Notes (Signed)
  Subjective:    Patient ID: Vanessa Melton, female    DOB: [birth year], 79 y.o.   MRN: [id number]  HPI Patient comes in today for followup on her left hip pain. Pain has improved dramatically. She has been very diligent with her home exercises. She is anxious to return to golf.    Review of Systems     Objective:   Physical Exam Well-developed, well-nourished. No acute distress  Left hip: There is still some slight tenderness to palpation just posterior to the greater trochanteric bursa. No pain with resisted hip abduction. Neurovascularly intact distally. Walking without a limp.      Assessment & Plan:  Improved left hip pain secondary to partial gluteus medius tendon strain/tear  Ultrasound at last visit showed some calcification in the area of her previous gluteus medius tendon tear. Clinically she is doing very well. I've reiterated the importance of continuing with her home exercises and I think she can slowly start to resume golf. I recommended that she start on the driving range with her high irons and gradually work her way through her back over 2-3 weeks. I've also recommended that she start with only 9 holes of golf when she is ready to play. If symptoms return I would have her return to the office for repeat ultrasound but I may also consider the merits of further diagnostic imaging in the form of an MRI scan. She will followup when necessary.

## 2012-05-20 ENCOUNTER — Ambulatory Visit (INDEPENDENT_AMBULATORY_CARE_PROVIDER_SITE_OTHER): Payer: BC Managed Care – PPO | Admitting: Family Medicine

## 2012-05-20 VITALS — BP 162/80 | HR 71 | Temp 98.0°F | Resp 16 | Ht 63.5 in | Wt 154.0 lb

## 2012-05-20 DIAGNOSIS — G43109 Migraine with aura, not intractable, without status migrainosus: Secondary | ICD-10-CM

## 2012-05-20 MED ORDER — TRAMADOL HCL 50 MG PO TABS: 50.0000 mg | ORAL_TABLET | Freq: Three times a day (TID) | ORAL | Status: DC | PRN

## 2012-05-20 MED ORDER — PROMETHAZINE HCL 25 MG/ML IJ SOLN
25.0000 mg | Freq: Once | INTRAMUSCULAR | Status: AC
  Administered 2012-05-20: 25 mg via INTRAMUSCULAR

## 2012-05-20 MED ORDER — PROMETHAZINE HCL 25 MG/ML IJ SOLN: 25.0000 mg | Freq: Four times a day (QID) | INTRAMUSCULAR | Status: DC | PRN

## 2012-05-20 MED ORDER — RIZATRIPTAN BENZOATE 5 MG PO TABS: 5.0000 mg | ORAL_TABLET | ORAL | Status: DC | PRN

## 2012-05-20 MED ORDER — KETOROLAC TROMETHAMINE 30 MG/ML IJ SOLN
30.0000 mg | Freq: Once | INTRAMUSCULAR | Status: AC
  Administered 2012-05-20: 30 mg via INTRAMUSCULAR

## 2012-05-20 MED ORDER — KETOROLAC TROMETHAMINE 60 MG/2ML IM SOLN: 60.0000 mg | Freq: Once | INTRAMUSCULAR | Status: DC

## 2012-05-20 MED ORDER — PROMETHAZINE HCL 12.5 MG PO TABS: 12.5000 mg | ORAL_TABLET | Freq: Four times a day (QID) | ORAL | Status: DC | PRN

## 2012-05-20 NOTE — Patient Instructions (Addendum)
Take the Maxalt at the onset of a migraine and see how it does you. You can repeated in 2 hours if needed.  Take tramadol every 6 hours as needed for pain  Take Phenergan (promethazine) with the tramadol which will help further it's effect on the migraine and will make you drowsy. Do not take it if you cannot afford to be drowsy at the time.  If migraines are doing worse, get rechecked.

## 2012-05-20 NOTE — Progress Notes (Signed)
Subjective: Patient was at the ophthalmologist and the bright light in her eyes triggered her migraine. She says this is classic for her migraines. She has a history of classic migraines. She has a severe left-sided headache. She has been to headache clinics in the past, but has done best to get a shot of Nubain. Unfortunately that is no longer available here.  Objective: Neck is supple. This is a typical migraine for her.  Assessment: Migraine Hypertension  Plan: 60 mg of Toradol IM with Phenergan 12.5 mg IM. She is 79 years old and upper quadrant could not give her a higher dose.  Prescribed: Maxalt 5 Ultram 50 Promethazine 12.5  Return if worse or go to the emergency room. She knows to call 911 in the event of acute worsening.

## 2012-05-26 ENCOUNTER — Encounter: Payer: BC Managed Care – PPO | Admitting: Family Medicine

## 2012-07-13 ENCOUNTER — Ambulatory Visit (INDEPENDENT_AMBULATORY_CARE_PROVIDER_SITE_OTHER): Payer: BC Managed Care – PPO | Admitting: Family Medicine

## 2012-07-13 ENCOUNTER — Encounter: Payer: Self-pay | Admitting: Family Medicine

## 2012-07-13 VITALS — BP 104/69 | HR 74 | Temp 97.8°F | Resp 18 | Ht 62.0 in | Wt 149.0 lb

## 2012-07-13 DIAGNOSIS — Z0000 Encounter for general adult medical examination without abnormal findings: Secondary | ICD-10-CM

## 2012-07-13 DIAGNOSIS — R599 Enlarged lymph nodes, unspecified: Secondary | ICD-10-CM

## 2012-07-13 DIAGNOSIS — G9001 Carotid sinus syncope: Secondary | ICD-10-CM

## 2012-07-13 DIAGNOSIS — R590 Localized enlarged lymph nodes: Secondary | ICD-10-CM

## 2012-07-13 DIAGNOSIS — I10 Essential (primary) hypertension: Secondary | ICD-10-CM

## 2012-07-13 LAB — POCT URINALYSIS DIPSTICK
Blood, UA: NEGATIVE
Glucose, UA: NEGATIVE
Ketones, UA: 15
Nitrite, UA: NEGATIVE
Spec Grav, UA: 1.025
pH, UA: 5.5

## 2012-07-13 LAB — SEDIMENTATION RATE: Sed Rate: 4 mm/hr (ref 0–22)

## 2012-07-13 NOTE — Progress Notes (Signed)
Subjective:    Patient ID: Vanessa Melton, female    DOB: [birth year], 79 y.o.   MRN: [id number]  HPI  This 79 y.o. Cauc female is here for CPE. She has well controlled HTN and is compliant w/  medication. She was treated for migraine HA in April 2014 at [facility]. Maxalt was prescribed  but pt has not had another HA since that visit.   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   HCM: Imm-current.            DEXA- 2008 (Osteopenia).            MMG- > 3 years ago (normal).   Review of Systems  Constitutional:       Difficulty reducing weight despite exercising 4 days a week for 1 1/2 hours and nutrition changes.  HENT: Positive for neck pain and neck stiffness.        Episode of neck pain w/ increased heart rate and BP, nausea and dizziness after using prescribed eye drops (Dr. Hazle Quant). Tenderness persisted along carotid vessels in both sides of neck.  Eyes: Positive for visual disturbance.  Respiratory: Positive for shortness of breath. Negative for cough and chest tightness.   Cardiovascular: Negative.   Gastrointestinal: Negative.   Endocrine: Negative.   Genitourinary: Negative.   Skin: Negative.   Allergic/Immunologic: Negative.   Neurological: Positive for headaches.  Hematological: Bruises/bleeds easily.  Psychiatric/Behavioral: Negative.        Objective:   Physical Exam  Nursing note and vitals reviewed. Constitutional: She is oriented to person, place, and time. Vital signs are normal. She appears well-developed and well-nourished. No distress.  HENT:  Head: Normocephalic and atraumatic.  Right Ear: Hearing, tympanic membrane, external ear and ear canal normal.  Left Ear: Hearing, tympanic membrane, external ear and ear canal normal.  Nose: Nose normal. No mucosal edema, rhinorrhea, nasal deformity or septal deviation.  Mouth/Throat: Uvula is midline, oropharynx is clear and moist and  mucous membranes are normal. No oral lesions. Normal dentition. No dental caries.  Eyes: Conjunctivae, EOM and lids are normal. Pupils are equal, round, and reactive to light. No scleral icterus.  Neck: Trachea normal. Neck supple. Normal carotid pulses, no hepatojugular reflux and no JVD present. Muscular tenderness present. No spinous process tenderness present. Carotid bruit is not present. No mass and no thyromegaly present.  Cardiovascular: Normal rate, regular rhythm, normal heart sounds and intact distal pulses.  Exam reveals no gallop and no friction rub.   No murmur heard. Pulmonary/Chest: Effort normal and breath sounds normal. No respiratory distress. She has no wheezes. She has no rales. She exhibits no tenderness. Right breast exhibits no inverted nipple, no mass, no nipple discharge, no skin change and no tenderness. Left breast exhibits no inverted nipple, no mass, no nipple discharge, no skin change and no tenderness. Breasts are symmetrical.  Abdominal: Soft. Normal appearance, normal aorta and bowel sounds are normal. She exhibits no distension, no abdominal bruit, no pulsatile midline mass and no mass. There is no hepatosplenomegaly. There is no tenderness. There is no guarding and no CVA tenderness. No hernia.  Genitourinary: Rectum normal. Rectal exam shows no external hemorrhoid, no internal hemorrhoid, no fissure, no mass, no tenderness and anal tone normal. Guaiac negative stool.  NEFG; no PAP or pelvic performed.  Musculoskeletal: Normal range of motion. She exhibits no edema and no tenderness.  Lymphadenopathy:  Head (right side): Submandibular adenopathy present. No submental, no tonsillar, no preauricular, no posterior auricular and no occipital adenopathy present.       Head (left side): Submandibular adenopathy present. No submental, no tonsillar, no preauricular, no posterior auricular and no occipital adenopathy present.    She has no cervical adenopathy.        Right cervical: No superficial cervical and no posterior cervical adenopathy present.      Left cervical: No superficial cervical and no posterior cervical adenopathy present.    She has no axillary adenopathy.       Right: No inguinal and no supraclavicular adenopathy present.       Left: No inguinal and no supraclavicular adenopathy present.  Neurological: She is alert and oriented to person, place, and time. She has normal reflexes. No cranial nerve deficit. She exhibits normal muscle tone. Coordination normal.  Skin: Skin is warm and dry. No rash noted. No erythema. No pallor.  Psychiatric: She has a normal mood and affect. Her behavior is normal. Judgment and thought content normal.     Results for orders placed in visit on 07/13/12  POCT URINALYSIS DIPSTICK      Result Value Range   Color, UA yellow     Clarity, UA clear     Glucose, UA neg     Bilirubin, UA small     Ketones, UA 15     Spec Grav, UA 1.025     Blood, UA neg     pH, UA 5.5     Protein, UA neg     Urobilinogen, UA 1.0     Nitrite, UA neg     Leukocytes, UA Trace    IFOBT (OCCULT BLOOD)      Result Value Range   IFOBT Negative      ECG: NSR; no ST-TW changes or ectopy.      Assessment & Plan:  Routine general medical examination at a health care facility - Plan: POCT urinalysis dipstick, IFOBT POC (occult bld, rslt in office), Sedimentation rate, Vitamin D 25 hydroxy, Basic metabolic panel, TSH, EKG 12-Lead  HTN, goal below 150/90- stable and controlled on current medication.  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 Plan: Carotid duplex  Lymphadenopathy, submandibular- monitor

## 2012-07-14 ENCOUNTER — Other Ambulatory Visit: Payer: Self-pay | Admitting: Family Medicine

## 2012-07-14 LAB — BASIC METABOLIC PANEL
CO2: 28 mEq/L (ref 19–32)
Chloride: 103 mEq/L (ref 96–112)
Creat: 0.96 mg/dL (ref 0.50–1.10)
Sodium: 141 mEq/L (ref 135–145)

## 2012-07-14 LAB — VITAMIN D 25 HYDROXY (VIT D DEFICIENCY, FRACTURES): Vit D, 25-Hydroxy: 36 ng/mL (ref 30–89)

## 2012-07-14 LAB — TSH: TSH: 11.486 u[IU]/mL — ABNORMAL HIGH (ref 0.350–4.500)

## 2012-07-14 MED ORDER — LEVOTHYROXINE SODIUM 75 MCG PO TABS: 75.0000 ug | ORAL_TABLET | Freq: Every day | ORAL | Status: DC

## 2012-07-14 NOTE — Progress Notes (Signed)
Quick Note:  Please contact pt and advise that the following labs are abnormal... All labs are normal except thyroid blood test shows you have an underactive gland; this means you needs a medication to supplement what your gland is not making. I am prescribing a low-dose thyroid supplement (Levothyroxine 75 mcg ) to be taken daily. It is at your pharmacy for pick-up later today. Thyroid labs will need to be repeated in 6-8 weeks to see if you are taking the right dose. Make an appointment to follow-up with me in about 2 months.  Copy to pt. ______

## 2012-09-02 ENCOUNTER — Ambulatory Visit (HOSPITAL_COMMUNITY)
Admission: RE | Admit: 2012-09-02 | Discharge: 2012-09-02 | Disposition: A | Discharge status: Home / Self Care | Payer: Medicare Other | Source: Ambulatory Visit | Attending: Family Medicine | Admitting: Family Medicine

## 2012-09-02 DIAGNOSIS — R0989 Other specified symptoms and signs involving the circulatory and respiratory systems: Secondary | ICD-10-CM

## 2012-09-02 DIAGNOSIS — G9001 Carotid sinus syncope: Secondary | ICD-10-CM | POA: Insufficient documentation

## 2012-09-02 NOTE — Progress Notes (Signed)
VASCULAR LAB PRELIMINARY  PRELIMINARY  PRELIMINARY  PRELIMINARY  Carotid duplex  completed.    Preliminary report:  Bilateral:  Less than 39% ICA stenosis.  No evidence of dissection.  Vertebral artery flow is antegrade.      Criston Chancellor, RVT 09/02/2012, 11:50 AM

## 2012-09-04 ENCOUNTER — Telehealth: Payer: Self-pay | Admitting: Family Medicine

## 2012-09-04 NOTE — Progress Notes (Signed)
Quick Note:  Your recent imaging study of the carotid arteries is normal. ______

## 2012-09-04 NOTE — Telephone Encounter (Signed)
I called pt's home to give her the results of recent carotid doppler study (normal). I left a message.

## 2013-01-12 ENCOUNTER — Other Ambulatory Visit: Payer: Self-pay | Admitting: Family Medicine

## 2013-02-18 ENCOUNTER — Encounter: Payer: Self-pay | Admitting: Family Medicine

## 2013-02-18 ENCOUNTER — Ambulatory Visit (INDEPENDENT_AMBULATORY_CARE_PROVIDER_SITE_OTHER): Payer: Medicare Other | Admitting: Family Medicine

## 2013-02-18 VITALS — BP 114/66 | HR 80 | Temp 98.6°F | Resp 16 | Ht 63.5 in | Wt 150.0 lb

## 2013-02-18 DIAGNOSIS — Z8669 Personal history of other diseases of the nervous system and sense organs: Secondary | ICD-10-CM

## 2013-02-18 DIAGNOSIS — I10 Essential (primary) hypertension: Secondary | ICD-10-CM

## 2013-02-18 DIAGNOSIS — E039 Hypothyroidism, unspecified: Secondary | ICD-10-CM

## 2013-02-18 LAB — BASIC METABOLIC PANEL
BUN: 15 mg/dL (ref 6–23)
CO2: 31 mEq/L (ref 19–32)
Calcium: 9.5 mg/dL (ref 8.4–10.5)
Chloride: 104 mEq/L (ref 96–112)
Creat: 0.87 mg/dL (ref 0.50–1.10)
Glucose, Bld: 96 mg/dL (ref 70–99)
POTASSIUM: 4.1 meq/L (ref 3.5–5.3)
SODIUM: 139 meq/L (ref 135–145)

## 2013-02-18 MED ORDER — RIZATRIPTAN BENZOATE 5 MG PO TABS: 5.0000 mg | ORAL_TABLET | ORAL | Status: DC | PRN

## 2013-02-18 MED ORDER — LOSARTAN POTASSIUM 100 MG PO TABS: 100.0000 mg | ORAL_TABLET | Freq: Every day | ORAL | Status: DC

## 2013-02-18 MED ORDER — LEVOTHYROXINE SODIUM 75 MCG PO TABS: 75.0000 ug | ORAL_TABLET | Freq: Every day | ORAL | Status: DC

## 2013-02-18 NOTE — Progress Notes (Signed)
S:  This 80 y.o. Cauc female has well controlled HTN and is stable on current medications w/o adverse effects. She [facility] [employer] [facility] per [facility]. 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 BP readings at home: 110-138/ 60-75. Pt is completely asymptomatic.  Pt has hypothyroidism and is compliant w/ medication. She inquires about a combination medication that she recently read about in Consumer Report. Current Levothyroxine dose seems to be effective; pt has very good energy level is mental clarity.  Pt has a history of migraine w/ aura and headaches are infrequent. The prescribed triptan (rizatriptan/ Maxalt) is effective but causes significant prolonged sedation. Pt recalls decades ago when she used to give self injection of Nubain which relieved HA w/o excessive sedation. She has had evaluation w/ HA specialist as well as neurology.   Pt also has ongoing glaucoma treatment w/ Dr. 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 Treatment includes laser to R eye.  Patient Active Problem List   Diagnosis Date Noted  . Strain of gluteus medius 02/26/2012  . Unspecified essential hypertension 09/23/2011  . Unspecified hypothyroidism 09/23/2011  . Migraine headache 09/23/2011  . Glaucoma 09/23/2011   PMHx, Surg Hx, Soc and Fam Hx reviewed. Medications reconciled.  ROS: As per HPI; negative for fatigue, diaphoresis, CP or tightness, palpitations, SOB or DOE, cough, edema, orthopnea, abd or back pain, HA, dizziness, numbness, weakness or syncope.  O: Filed Vitals:   02/18/13 1516  BP: 114/66  Pulse: 80  Temp: 98.6 F (37 C)  Resp: 16   GEN: In NAD; WN,WD. Appears younger than stated age. HENT: [place]/AT; EOMI w/ clear conj/sclerae. EACs/nose/oroph unremarkable. COR: RRR. LUNGS: Unlabored resp. SKIN: W&D; intact w/o diaphoresis, erythema or  pallor. MS: MAEs; no deformities or muscle atrophy. NEURO: A&O x 3; CNs intact. Nonfocal.  A/P: Unspecified essential hypertension - Continue Losartan 100 mg 1 tab daily. Maintain current fitness program.  Plan: Basic metabolic panel  Unspecified hypothyroidism - Discussed combination thyroid supplement and advised against it; pt agrees to continue current medication since she is doing well at this time. I have refilled med w/o dose change pending lab results. Plan: TSH, T4, free, T3, Free  History of migraine with aura - Discussed w/ pt the lack of injectables (narcotics) available but pt is having very infrequent migraines.         Plan: rizatriptan (MAXALT) 5 MG tablet  Meds ordered this encounter  Medications  . losartan (COZAAR) 100 MG tablet    Sig: Take 1 tablet (100 mg total) by mouth daily.    Dispense:  90 tablet    Refill:  3  . levothyroxine (SYNTHROID, LEVOTHROID) 75 MCG tablet    Sig: Take 1 tablet (75 mcg total) by mouth daily.    Dispense:  90 tablet    Refill:  1  . DISCONTD: rizatriptan (MAXALT) 5 MG tablet    Sig: Take 1 tablet (5 mg total) by mouth as needed for migraine. May repeat in 2 hours if needed    Dispense:  10 tablet    Refill:  3  . rizatriptan (MAXALT) 5 MG tablet    Sig: Take 1 tablet (5 mg total) by mouth as needed for migraine. May repeat in 2 hours if needed    Dispense:  10 tablet  Refill:  1

## 2013-02-18 NOTE — Patient Instructions (Signed)
RE: Medication to treat hypothyroidism- it is recommended that "combination medication (i.e. [place]our thyroid) be used for special subsets of patients who do not normalize on standard replacement medication (Levothyroxine or Synthroid).  There is too much variation in blood levels along with psychological impairment when hypothyroidism is not adequately treated; levels fluctuate and require more testing when using combination medication or T3 preparation (Cytomel).   I would suggest we continue the current medication that you are taking since you are doing so well.

## 2013-02-19 LAB — TSH: TSH: 1.854 u[IU]/mL (ref 0.350–4.500)

## 2013-02-19 LAB — T4, FREE: FREE T4: 1.23 ng/dL (ref 0.80–1.80)

## 2013-02-19 LAB — T3, FREE: T3, Free: 2.4 pg/mL (ref 2.3–4.2)

## 2013-02-19 NOTE — Progress Notes (Signed)
Quick Note:  Please notify pt that results are normal.   Provide pt with copy of labs. ______ 

## 2013-02-20 ENCOUNTER — Encounter: Payer: Self-pay | Admitting: Family Medicine

## 2013-07-19 ENCOUNTER — Ambulatory Visit (INDEPENDENT_AMBULATORY_CARE_PROVIDER_SITE_OTHER): Payer: Medicare Other | Admitting: Family Medicine

## 2013-07-19 ENCOUNTER — Encounter: Payer: Self-pay | Admitting: Family Medicine

## 2013-07-19 VITALS — BP 113/70 | HR 72 | Temp 98.2°F | Resp 16 | Ht 61.5 in | Wt 149.0 lb

## 2013-07-19 DIAGNOSIS — M79609 Pain in unspecified limb: Secondary | ICD-10-CM

## 2013-07-19 DIAGNOSIS — R252 Cramp and spasm: Secondary | ICD-10-CM

## 2013-07-19 DIAGNOSIS — E039 Hypothyroidism, unspecified: Secondary | ICD-10-CM

## 2013-07-19 DIAGNOSIS — M79672 Pain in left foot: Secondary | ICD-10-CM

## 2013-07-19 DIAGNOSIS — I10 Essential (primary) hypertension: Secondary | ICD-10-CM

## 2013-07-19 LAB — COMPLETE METABOLIC PANEL WITH GFR
ALT: 20 U/L (ref 0–35)
AST: 31 U/L (ref 0–37)
Albumin: 4.3 g/dL (ref 3.5–5.2)
Alkaline Phosphatase: 54 U/L (ref 39–117)
BILIRUBIN TOTAL: 0.4 mg/dL (ref 0.2–1.2)
BUN: 14 mg/dL (ref 6–23)
CO2: 27 meq/L (ref 19–32)
Calcium: 9.7 mg/dL (ref 8.4–10.5)
Chloride: 103 mEq/L (ref 96–112)
Creat: 0.8 mg/dL (ref 0.50–1.10)
GFR, EST AFRICAN AMERICAN: 82 mL/min
GFR, Est Non African American: 71 mL/min
Glucose, Bld: 86 mg/dL (ref 70–99)
POTASSIUM: 4.6 meq/L (ref 3.5–5.3)
SODIUM: 139 meq/L (ref 135–145)
TOTAL PROTEIN: 6.5 g/dL (ref 6.0–8.3)

## 2013-07-19 NOTE — Progress Notes (Signed)
Subjective:    Patient ID: Jairo BenElizabeth J Fullman, female    DOB: 1932/10/29, 80 y.o.   MRN: [id number]  HPI  This 80 y.o. Cauc female presents for evaluation of L heel pain. Pt reports bilateral heel pain x 1-2 weeks. Pain is worse upon arising in the morning and bearing weight; this eases off as day progresses. L heel pain has persisted such that she has only one pair of slip-on flip-flops that are comfortable. Pain aggravated by activity at a horse show ~ 1 1/2 weeks ago. She wore "barn shoes" (tennis shoes) that she wears around the stables. No significant improvement w/ oral analgesics.  Pt has hypothyroidism, controlled on current Levothyroxine dose. Energy level is good and no report of fatigue, constipation, myalgias, mental fogginess or concentration difficulties. 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  HTN is well controlled on current medication w/o adverse effects. No report of diaphoresis, vision disturbances, CP or tightness, palpitations, SOB or DOE< cough, edema, HA, dizziness, weakness or syncope.  Pt c/o nocturnal muscle cramps especially in calf muscles. No myalgias or muscle soreness.   Patient Active Problem List   Diagnosis Date Noted  . Strain of gluteus medius 02/26/2012  . Unspecified essential hypertension 09/23/2011  . Unspecified hypothyroidism 09/23/2011  . Migraine headache 09/23/2011  . Glaucoma 09/23/2011   Prior to Admission medications   Medication Sig Start Date End Date Taking? Authorizing Isahia Hollerbach  levothyroxine (SYNTHROID, LEVOTHROID) 75 MCG tablet Take 1 tablet (75 mcg total) by mouth daily. 02/18/13  Yes Maurice MarchBarbara B McPherson, MD  losartan (COZAAR) 100 MG tablet Take 1 tablet (100 mg total) by mouth daily. 02/18/13  Yes Maurice MarchBarbara B McPherson, MD  rizatriptan (MAXALT) 5 MG tablet Take 1 tablet (5 mg total) by mouth as needed for migraine. May repeat in 2 hours  if needed 02/18/13  Yes Maurice MarchBarbara B McPherson, MD  promethazine (PHENERGAN) 12.5 MG tablet Take 1 tablet (12.5 mg total) by mouth every 6 (six) hours as needed for nausea. 05/20/12   Peyton Najjaravid H Hopper, MD   PMHx, Surg Hx, Soc and Fam Hx reviewed.   Review of Systems  Constitutional: Negative.   Eyes: Negative for visual disturbance.  Respiratory: Negative for cough, chest tightness, shortness of breath and wheezing.   Cardiovascular: Negative.   Endocrine: Negative.   Musculoskeletal: Negative for back pain, gait problem, joint swelling and myalgias.  Skin: Negative.   Neurological: Negative.   Hematological: Negative.   Psychiatric/Behavioral: Negative.       Objective:   Physical Exam  Nursing note and vitals reviewed. Constitutional: She is oriented to person, place, and time. She appears well-developed and well-nourished. No distress.  HENT:  Head: Normocephalic and atraumatic.  Eyes: Conjunctivae and EOM are normal. Pupils are equal, round, and reactive to light. No scleral icterus.  Cardiovascular: Normal rate and regular rhythm.   Pulmonary/Chest: No respiratory distress.  Musculoskeletal:       Right ankle: Normal. Achilles tendon normal.       Left ankle: She exhibits normal range of motion, no swelling and no deformity.       Left foot: She exhibits tenderness. She exhibits no swelling and no deformity.  L heel- tender plantar aspect of calcaneous.  Neurological: She is alert and oriented to person, place, and time. No cranial nerve deficit. She exhibits normal muscle tone. Coordination normal.  Skin: Skin is warm  and dry. She is not diaphoretic.  Psychiatric: She has a normal mood and affect. Her behavior is normal. Judgment and thought content normal.      Assessment & Plan:  Pain of left heel - Plan: Ambulatory referral to Podiatry  Unspecified hypothyroidism - Continue current medication dose pending lab result. Plan: TSH  Unspecified essential hypertension - Stable  and controlled on current medication. Plan: COMPLETE METABOLIC PANEL WITH GFR  Muscle cramps at night- Pt to try homeopathic remedy as well as topical analgesic.

## 2013-07-19 NOTE — Patient Instructions (Signed)
Plantar Fasciitis Plantar fasciitis is a common condition that causes foot pain. It is soreness (inflammation) of the band of tough fibrous tissue on the bottom of the foot that runs from the heel bone (calcaneus) to the ball of the foot. The cause of this soreness may be from excessive standing, poor fitting shoes, running on hard surfaces, being overweight, having an abnormal walk, or overuse (this is common in runners) of the painful foot or feet. It is also common in aerobic exercise dancers and ballet dancers. SYMPTOMS  Most people with plantar fasciitis complain of:  Severe pain in the morning on the bottom of their foot especially when taking the first steps out of bed. This pain recedes after a few minutes of walking.  Severe pain is experienced also during walking following a long period of inactivity.  Pain is worse when walking barefoot or up stairs DIAGNOSIS   Your caregiver will diagnose this condition by examining and feeling your foot.  Special tests such as X-rays of your foot, are usually not needed. PREVENTION   Consult a sports medicine professional before beginning a new exercise program.  Walking programs offer a good workout. With walking there is a lower chance of overuse injuries common to runners. There is less impact and less jarring of the joints.  Begin all new exercise programs slowly. If problems or pain develop, decrease the amount of time or distance until you are at a comfortable level.  Wear good shoes and replace them regularly.  Stretch your foot   and the heel cords at the back of the ankle (Achilles tendon) both before and after exercise.  Run or exercise on even surfaces that are not hard. For example, asphalt is better than pavement.  Do not run barefoot on hard surfaces.  If using a treadmill, vary the incline.  Do not continue to workout if you have foot or joint problems. Seek professional help if they do not improve. HOME CARE  INSTRUCTIONS   Avoid activities that cause you pain until you recover.  Use ice or cold packs on the problem or painful areas after working out.  Only take over-the-counter or prescription medicines for pain, discomfort, or fever as directed by your caregiver.  Soft shoe inserts or athletic shoes with air or gel sole cushions may be helpful.  If problems continue or become more severe, consult a sports medicine caregiver or your own health care provider. Cortisone is a potent anti-inflammatory medication that may be injected into the painful area. You can discuss this treatment with your caregiver. MAKE SURE YOU:   Understand these instructions.  Will watch your condition.  Will get help right away if you are not doing well or get worse. Document Released: 10/15/2000 Document Revised: 04/14/2011 Document Reviewed: 12/15/2007 [facility] Patient Information 2014 [place]Care, [place].    Leg Cramps Leg cramps that occur during exercise can be caused by poor circulation or dehydration. However, muscle cramps that occur at rest or during the night are usually not due to any serious medical problem. Heat cramps may cause muscle spasms during hot weather.  CAUSES There is no clear cause for muscle cramps. However, dehydration may be a factor for those who do not drink enough fluids and those who exercise in the heat. Imbalances in the level of sodium, potassium, calcium or magnesium in the muscle tissue may also be a factor. Some medications, such as water pills (diuretics), may cause loss of chemicals that the body needs (like sodium and  potassium) and cause muscle cramps. TREATMENT   Make sure your diet has enough fluids and essential minerals for the muscle to work normally.  Avoid strenuous exercise for several days if you have been having frequent leg cramps.  Stretch and massage the cramped muscle for several minutes.  Some medicines may be helpful in some patients with night cramps. Only  take over-the-counter or prescription medicines as directed by your caregiver. SEEK IMMEDIATE MEDICAL CARE IF:   Your leg cramps become worse.  Your foot becomes cold, numb, or blue. Document Released: 02/28/2004 Document Revised: 04/14/2011 Document Reviewed: 02/15/2008 [facility]Care Patient Information 2014 [place], [place].    You can try a topical foot cream that may help reduce the heel pain.

## 2013-07-20 LAB — TSH: TSH: 0.726 u[IU]/mL (ref 0.350–4.500)

## 2013-07-22 ENCOUNTER — Other Ambulatory Visit: Payer: Self-pay | Admitting: Family Medicine

## 2013-07-22 MED ORDER — LEVOTHYROXINE SODIUM 75 MCG PO TABS: 75.0000 ug | ORAL_TABLET | Freq: Every day | ORAL | Status: DC

## 2013-07-29 ENCOUNTER — Encounter: Payer: Self-pay | Admitting: Podiatry

## 2013-07-29 ENCOUNTER — Ambulatory Visit (INDEPENDENT_AMBULATORY_CARE_PROVIDER_SITE_OTHER): Payer: 59

## 2013-07-29 ENCOUNTER — Ambulatory Visit (INDEPENDENT_AMBULATORY_CARE_PROVIDER_SITE_OTHER): Payer: 59 | Admitting: Podiatry

## 2013-07-29 VITALS — BP 111/66 | HR 65 | Resp 16 | Ht 64.0 in | Wt 148.0 lb

## 2013-07-29 DIAGNOSIS — M722 Plantar fascial fibromatosis: Secondary | ICD-10-CM

## 2013-07-29 MED ORDER — DICLOFENAC SODIUM 75 MG PO TBEC: 75.0000 mg | DELAYED_RELEASE_TABLET | Freq: Two times a day (BID) | ORAL | Status: DC

## 2013-07-29 NOTE — Progress Notes (Signed)
   Subjective:    Patient ID: Vanessa Melton, female    DOB: [birth year], 80 y.o.   MRN: [id number]  HPI Comments: A lot of heel pain in both plantar heels left greater than the right . It has been a sharp , dull ache The mornings are the worse , cant walk on it   Foot Pain      Review of Systems     Objective:   Physical Exam        Assessment & Plan:

## 2013-07-29 NOTE — Patient Instructions (Signed)
Plantar Fasciitis (Heel Spur Syndrome) with Rehab The plantar fascia is a fibrous, ligament-like, soft-tissue structure that spans the bottom of the foot. Plantar fasciitis is a condition that causes pain in the foot due to inflammation of the tissue. SYMPTOMS   Pain and tenderness on the underneath side of the foot.  Pain that worsens with standing or walking. CAUSES  Plantar fasciitis is caused by irritation and injury to the plantar fascia on the underneath side of the foot. Common mechanisms of injury include:  Direct trauma to bottom of the foot.  Damage to a small nerve that runs under the foot where the main fascia attaches to the heel bone.  Stress placed on the plantar fascia due to bone spurs. RISK INCREASES WITH:   Activities that place stress on the plantar fascia (running, jumping, pivoting, or cutting).  Poor strength and flexibility.  Improperly fitted shoes.  Tight calf muscles.  Flat feet.  Failure to warm-up properly before activity.  Obesity. PREVENTION  Warm up and stretch properly before activity.  Allow for adequate recovery between workouts.  Maintain physical fitness:  Strength, flexibility, and endurance.  Cardiovascular fitness.  Maintain a health body weight.  Avoid stress on the plantar fascia.  Wear properly fitted shoes, including arch supports for individuals who have flat feet. PROGNOSIS  If treated properly, then the symptoms of plantar fasciitis usually resolve without surgery. However, occasionally surgery is necessary. RELATED COMPLICATIONS   Recurrent symptoms that may result in a chronic condition.  Problems of the lower back that are caused by compensating for the injury, such as limping.  Pain or weakness of the foot during push-off following surgery.  Chronic inflammation, scarring, and partial or complete fascia tear, occurring more often from repeated injections. TREATMENT  Treatment initially involves the use of  ice and medication to help reduce pain and inflammation. The use of strengthening and stretching exercises may help reduce pain with activity, especially stretches of the Achilles tendon. These exercises may be performed at home or with a therapist. Your caregiver may recommend that you use heel cups of arch supports to help reduce stress on the plantar fascia. Occasionally, corticosteroid injections are given to reduce inflammation. If symptoms persist for greater than 6 months despite non-surgical (conservative), then surgery may be recommended.  MEDICATION   If pain medication is necessary, then nonsteroidal anti-inflammatory medications, such as aspirin and ibuprofen, or other minor pain relievers, such as acetaminophen, are often recommended.  Do not take pain medication within 7 days before surgery.  Prescription pain relievers may be given if deemed necessary by your caregiver. Use only as directed and only as much as you need.  Corticosteroid injections may be given by your caregiver. These injections should be reserved for the most serious cases, because they may only be given a certain number of times. HEAT AND COLD  Cold treatment (icing) relieves pain and reduces inflammation. Cold treatment should be applied for 10 to 15 minutes every 2 to 3 hours for inflammation and pain and immediately after any activity that aggravates your symptoms. Use ice packs or massage the area with a piece of ice (ice massage).  Heat treatment may be used prior to performing the stretching and strengthening activities prescribed by your caregiver, physical therapist, or athletic trainer. Use a heat pack or soak the injury in warm water. SEEK IMMEDIATE MEDICAL CARE IF:  Treatment seems to offer no benefit, or the condition worsens.  Any medications produce adverse side effects. EXERCISES RANGE   OF MOTION (ROM) AND STRETCHING EXERCISES - Plantar Fasciitis (Heel Spur Syndrome) These exercises may help you  when beginning to rehabilitate your injury. Your symptoms may resolve with or without further involvement from your physician, physical therapist or athletic trainer. While completing these exercises, remember:   Restoring tissue flexibility helps normal motion to return to the joints. This allows healthier, less painful movement and activity.  An effective stretch should be held for at least 30 seconds.  A stretch should never be painful. You should only feel a gentle lengthening or release in the stretched tissue. RANGE OF MOTION - Toe Extension, Flexion  Sit with your right / left leg crossed over your opposite knee.  Grasp your toes and gently pull them back toward the top of your foot. You should feel a stretch on the bottom of your toes and/or foot.  Hold this stretch for __________ seconds.  Now, gently pull your toes toward the bottom of your foot. You should feel a stretch on the top of your toes and or foot.  Hold this stretch for __________ seconds. Repeat __________ times. Complete this stretch __________ times per day.  RANGE OF MOTION - Ankle Dorsiflexion, Active Assisted  Remove shoes and sit on a chair that is preferably not on a carpeted surface.  Place right / left foot under knee. Extend your opposite leg for support.  Keeping your heel down, slide your right / left foot back toward the chair until you feel a stretch at your ankle or calf. If you do not feel a stretch, slide your bottom forward to the edge of the chair, while still keeping your heel down.  Hold this stretch for __________ seconds. Repeat __________ times. Complete this stretch __________ times per day.  STRETCH - Gastroc, Standing  Place hands on wall.  Extend right / left leg, keeping the front knee somewhat bent.  Slightly point your toes inward on your back foot.  Keeping your right / left heel on the floor and your knee straight, shift your weight toward the wall, not allowing your back to  arch.  You should feel a gentle stretch in the right / left calf. Hold this position for __________ seconds. Repeat __________ times. Complete this stretch __________ times per day. STRETCH - Soleus, Standing  Place hands on wall.  Extend right / left leg, keeping the other knee somewhat bent.  Slightly point your toes inward on your back foot.  Keep your right / left heel on the floor, bend your back knee, and slightly shift your weight over the back leg so that you feel a gentle stretch deep in your back calf.  Hold this position for __________ seconds. Repeat __________ times. Complete this stretch __________ times per day. STRETCH - Gastrocsoleus, Standing  Note: This exercise can place a lot of stress on your foot and ankle. Please complete this exercise only if specifically instructed by your caregiver.   Place the ball of your right / left foot on a step, keeping your other foot firmly on the same step.  Hold on to the wall or a rail for balance.  Slowly lift your other foot, allowing your body weight to press your heel down over the edge of the step.  You should feel a stretch in your right / left calf.  Hold this position for __________ seconds.  Repeat this exercise with a slight bend in your right / left knee. Repeat __________ times. Complete this stretch __________ times per day.    STRENGTHENING EXERCISES - Plantar Fasciitis (Heel Spur Syndrome)  These exercises may help you when beginning to rehabilitate your injury. They may resolve your symptoms with or without further involvement from your physician, physical therapist or athletic trainer. While completing these exercises, remember:   Muscles can gain both the endurance and the strength needed for everyday activities through controlled exercises.  Complete these exercises as instructed by your physician, physical therapist or athletic trainer. Progress the resistance and repetitions only as guided. STRENGTH -  Towel Curls  Sit in a chair positioned on a non-carpeted surface.  Place your foot on a towel, keeping your heel on the floor.  Pull the towel toward your heel by only curling your toes. Keep your heel on the floor.  If instructed by your physician, physical therapist or athletic trainer, add ____________________ at the end of the towel. Repeat __________ times. Complete this exercise __________ times per day. STRENGTH - Ankle Inversion  Secure one end of a rubber exercise band/tubing to a fixed object (table, pole). Loop the other end around your foot just before your toes.  Place your fists between your knees. This will focus your strengthening at your ankle.  Slowly, pull your big toe up and in, making sure the band/tubing is positioned to resist the entire motion.  Hold this position for __________ seconds.  Have your muscles resist the band/tubing as it slowly pulls your foot back to the starting position. Repeat __________ times. Complete this exercises __________ times per day.  Document Released: 01/20/2005 Document Revised: 04/14/2011 Document Reviewed: 05/04/2008 Exit[employer] Patient Information 2015 [employer]. This information is not intended to replace advice given to you by your health care provider. Make sure you discuss any questions you have with your health care provider. Plantar Fasciitis Plantar fasciitis is a common condition that causes foot pain. It is soreness (inflammation) of the band of tough fibrous tissue on the bottom of the foot that runs from the heel bone (calcaneus) to the ball of the foot. The cause of this soreness may be from excessive standing, poor fitting shoes, running on hard surfaces, being overweight, having an abnormal walk, or overuse (this is common in runners) of the painful foot or feet. It is also common in aerobic exercise dancers and ballet dancers. SYMPTOMS  Most people with plantar fasciitis complain of:  Severe pain in the morning  on the bottom of their foot especially when taking the first steps out of bed. This pain recedes after a few minutes of walking.  Severe pain is experienced also during walking following a long period of inactivity.  Pain is worse when walking barefoot or up stairs DIAGNOSIS   Your caregiver will diagnose this condition by examining and feeling your foot.  Special tests such as X-rays of your foot, are usually not needed. PREVENTION   Consult a sports medicine professional before beginning a new exercise program.  Walking programs offer a good workout. With walking there is a lower chance of overuse injuries common to runners. There is less impact and less jarring of the joints.  Begin all new exercise programs slowly. If problems or pain develop, decrease the amount of time or distance until you are at a comfortable level.  Wear good shoes and replace them regularly.  Stretch your foot and the heel cords at the back of the ankle (Achilles tendon) both before and after exercise.  Run or exercise on even surfaces that are not hard. For example, asphalt is better than   pavement.  Do not run barefoot on hard surfaces.  If using a treadmill, vary the incline.  Do not continue to workout if you have foot or joint problems. Seek professional help if they do not improve. HOME CARE INSTRUCTIONS   Avoid activities that cause you pain until you recover.  Use ice or cold packs on the problem or painful areas after working out.  Only take over-the-counter or prescription medicines for pain, discomfort, or fever as directed by your caregiver.  Soft shoe inserts or athletic shoes with air or gel sole cushions may be helpful.  If problems continue or become more severe, consult a sports medicine caregiver or your own health care provider. Cortisone is a potent anti-inflammatory medication that may be injected into the painful area. You can discuss this treatment with your caregiver. MAKE SURE  YOU:   Understand these instructions.  Will watch your condition.  Will get help right away if you are not doing well or get worse. Document Released: 10/15/2000 Document Revised: 04/14/2011 Document Reviewed: 12/15/2007 Exit[employer] Patient Information 2015 [employer]. This information is not intended to replace advice given to you by your health care provider. Make sure you discuss any questions you have with your health care provider.  

## 2013-07-30 NOTE — Progress Notes (Signed)
Subjective:     Patient ID: Vanessa Melton, female   DOB: 1932-07-20, 80 y.o.   MRN: [id number]  Foot Pain   patient presents stating I'm having a lot of pain in both of the heels and they're making it difficult for me to walk long distances. Been going on for several months   Review of Systems  All other systems reviewed and are negative.      Objective:   Physical Exam  Nursing note and vitals reviewed. Constitutional: She is oriented to person, place, and time.  Cardiovascular: Intact distal pulses.   Musculoskeletal: Normal range of motion.  Neurological: She is oriented to person, place, and time.  Skin: Skin is warm.   neurovascular status intact with muscle strength adequate and range of motion subtalar and midtarsal joint within normal limits. Patient is found to have pain in the plantar heel of both feet with inflammation and fluid buildup noted     Assessment:     Plan her fasciitis of both heels with inflammation and fluid noted    Plan:     H&P and x-rays reviewed. We are holding off on injections and today I applied fascially brace is to both feet and gave instructions on physical therapy and reduced activity. Reappoint to recheck

## 2013-08-05 ENCOUNTER — Ambulatory Visit (INDEPENDENT_AMBULATORY_CARE_PROVIDER_SITE_OTHER): Payer: 59 | Admitting: Podiatry

## 2013-08-05 ENCOUNTER — Encounter: Payer: Self-pay | Admitting: Podiatry

## 2013-08-05 DIAGNOSIS — M7660 Achilles tendinitis, unspecified leg: Secondary | ICD-10-CM

## 2013-08-05 DIAGNOSIS — M722 Plantar fascial fibromatosis: Secondary | ICD-10-CM

## 2013-08-05 NOTE — Progress Notes (Signed)
Subjective:     Patient ID: Vanessa Melton, female   DOB: Jul 24, 1932, 80 y.o.   MRN: [id number]  HPI patient states doing quite a bit better but still having discomfort if she's been on the feet for long term and the back of the left heel has started to hurt   Review of Systems     Objective:   Physical Exam Neurovascular status intact with discomfort of the posterior aspect left heel that is improving but is still getting some pain    Assessment:     Capsulitis posterior aspect left with Achilles tendinitis    Plan:     Explained condition and reviewed physical therapy and he'll support to try to take pressure off and also continue stretching exercises for the plantar heel. Reappoint to recheck

## 2013-08-05 NOTE — Patient Instructions (Signed)

## 2014-01-14 ENCOUNTER — Ambulatory Visit (INDEPENDENT_AMBULATORY_CARE_PROVIDER_SITE_OTHER): Payer: 59 | Admitting: Emergency Medicine

## 2014-01-14 VITALS — BP 118/76 | HR 66 | Temp 98.1°F | Resp 12 | Ht 61.5 in | Wt 147.5 lb

## 2014-01-14 DIAGNOSIS — Z8669 Personal history of other diseases of the nervous system and sense organs: Secondary | ICD-10-CM

## 2014-01-14 MED ORDER — KETOROLAC TROMETHAMINE 30 MG/ML IJ SOLN
30.0000 mg | Freq: Once | INTRAMUSCULAR | Status: AC
  Administered 2014-01-14: 30 mg via INTRAMUSCULAR

## 2014-01-14 MED ORDER — PROMETHAZINE HCL 25 MG/ML IJ SOLN
12.5000 mg | Freq: Once | INTRAMUSCULAR | Status: AC
  Administered 2014-01-14: 12.5 mg via INTRAMUSCULAR

## 2014-01-14 NOTE — Progress Notes (Signed)
   Subjective:    Patient ID: Vanessa Melton, female    DOB: 10/03/1932, 80 y.o.   MRN: [id number]  Migraine  This is a recurrent problem. The current episode started today. The problem has been gradually worsening. The pain is located in the left unilateral region. The pain does not radiate. The pain quality is similar to prior headaches. The quality of the pain is described as aching. The pain is at a severity of 5/10.      Review of Systems  Eyes:       The patient recently had surgery on her right eye.       Objective:   Physical Exam  Constitutional: She is oriented to person, place, and time. She appears well-developed and well-nourished.  HENT:  Head: Normocephalic.  Eyes:  Patient is wearing glasses and has photophobia of the left eye.  Neck: Neck supple.  Cardiovascular: Normal rate and regular rhythm.   Pulmonary/Chest: Effort normal. No respiratory distress.  Neurological: She is alert and oriented to person, place, and time. She has normal reflexes. She displays normal reflexes. No cranial nerve deficit. She exhibits normal muscle tone. Coordination normal.          Assessment & Plan:  Patient has a long history of migraines. She has responded well to injections in the past. She was given Toradol 30 mg and Phenergan 12.5 mg and hopefully this will give her some relief. She is instructed to go to the emergency room if worsening.

## 2014-02-23 ENCOUNTER — Telehealth: Payer: Self-pay

## 2014-02-23 MED ORDER — LOSARTAN POTASSIUM 100 MG PO TABS: 100.0000 mg | ORAL_TABLET | Freq: Every day | ORAL | Status: DC

## 2014-02-23 NOTE — Telephone Encounter (Signed)
We have not received any reqs from the pharm in [place]. Sent in 1 mos RF and notified pt done and needs check up for more. Pt agreed.

## 2014-02-23 NOTE — Telephone Encounter (Signed)
Pt calling to state that pharmacy contacted us to request a refill on [place]in on Tuesday. Pt states she is completely out of medication now and really needs to pick this up before tonight as she will not be able to get there with the bad weather. Please advise.

## 2014-03-03 ENCOUNTER — Other Ambulatory Visit: Payer: Self-pay | Admitting: Family Medicine

## 2014-04-28 ENCOUNTER — Ambulatory Visit (INDEPENDENT_AMBULATORY_CARE_PROVIDER_SITE_OTHER): Payer: Medicare Other | Admitting: Family Medicine

## 2014-04-28 ENCOUNTER — Encounter: Payer: Self-pay | Admitting: Family Medicine

## 2014-04-28 VITALS — BP 120/75 | HR 76 | Temp 98.0°F | Resp 16 | Ht 62.0 in | Wt 150.0 lb

## 2014-04-28 DIAGNOSIS — E034 Atrophy of thyroid (acquired): Secondary | ICD-10-CM

## 2014-04-28 DIAGNOSIS — E038 Other specified hypothyroidism: Secondary | ICD-10-CM

## 2014-04-28 DIAGNOSIS — I10 Essential (primary) hypertension: Secondary | ICD-10-CM

## 2014-04-28 DIAGNOSIS — Z23 Encounter for immunization: Secondary | ICD-10-CM | POA: Diagnosis not present

## 2014-04-28 MED ORDER — LOSARTAN POTASSIUM 100 MG PO TABS: 100.0000 mg | ORAL_TABLET | Freq: Every day | ORAL | Status: DC

## 2014-04-28 MED ORDER — LEVOTHYROXINE SODIUM 75 MCG PO TABS: 75.0000 ug | ORAL_TABLET | Freq: Every day | ORAL | Status: DC

## 2014-04-28 NOTE — Patient Instructions (Signed)
Please call back in the summer and schedule a physical exam with one of our physicians for complete physical with fasting labs. I have refilled thyroid medication for 6 months and BP medication for 1 year.  It has been a pleasure and privilege being part of your healthcare team; enjoy retirement and enjoy that you are blessed with good health and longevity!

## 2014-04-29 NOTE — Progress Notes (Signed)
S: This 81 y.o. Female has well controlled HTN and hypothyroidism. She is compliant w/ medications w/o adverse effects. 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 She has many interests that she will pursue in her free time. She has horses that she shows and plays golf as well as travels. Pt has not experienced fatigue, diaphoresis, abnormal weight change, vision disturbances, CP or tightness, palpitations, SOB or DOE, edema, GI problems, HA, dizziness, numbness, weakness or syncope.  Patient Active Problem List   Diagnosis Date Noted  . Strain of gluteus medius 02/26/2012  . Essential hypertension 09/23/2011  . Hypothyroidism 09/23/2011  . Migraine headache 09/23/2011  . Glaucoma 09/23/2011    Prior to Admission medications   Medication Sig Start Date End Date Taking? Authorizing Provider  levothyroxine (SYNTHROID, LEVOTHROID) 75 MCG tablet Take 1 tablet (75 mcg total) by mouth daily.   Yes Maurice MarchBarbara B Hendy Brindle, MD  losartan (COZAAR) 100 MG tablet Take 1 tablet (100 mg total) by mouth daily.   Yes Maurice MarchBarbara B Clance Baquero, MD    Past Surgical History  Procedure Laterality Date  . Cosmetic surgery    . Eye surgery    . Fracture surgery    . Eye muscle surgery       History   Social History  . Marital Status: Divorced    Spouse Name: N/A  . Number of Children: N/A  . Years of Education: Advanced degree   Occupational History  . Professor at [employer]   Social History Main Topics  . Smoking status: Former Smoker -- 5 years    Types: Cigarettes    Quit date: 02/03/1957  . Smokeless tobacco: Never Used  . Alcohol Use: 4.2 oz/week    7 Glasses of wine per week     Comment: social  . Drug Use: No  . Sexual Activity: Yes    Birth Control/ Protection: None   Other Topics Concern  . Not on file   Social History Narrative    ROS; As per HPI.  O: Filed Vitals:   04/28/14 1431  BP: 120/75  Pulse: 76  Temp: 98 F (36.7 C)   Resp: 16   GEN: In NAD: WN,WD. Does not appear stated age. HENT: [place]/AT; EOMI w/ clear conj/sclerae. Otherwise unremarkable. COR; RRR. Normal S1 and S2. No m/g/r. LUNGS: CTA; normal resp rate and effort. MS: MAEs; no deformities,c/c/e. NEURO: A&O x 3; CNs intact. Nonfocal.  A/P: Essential hypertension- Stable on losartan 100 mg; refill x 1 year.  Hypothyroidism due to acquired atrophy of thyroid- Stable on levothyroxine 75 mcg 1 tab q AM.  Need for prophylactic vaccination with combined diphtheria-tetanus-pertussis (DTP) vaccine - Plan: Td vaccine greater than or equal to 7yo preservative free IM   Meds ordered this encounter  Medications  . levothyroxine (SYNTHROID, LEVOTHROID) 75 MCG tablet    Sig: Take 1 tablet (75 mcg total) by mouth daily.    Dispense:  90 tablet    Refill:  1  . losartan (COZAAR) 100 MG tablet    Sig: Take 1 tablet (100 mg total) by mouth daily.    Dispense:  90 tablet    Refill:  3   Pt will call back this summer to schedule CPE as she is not sure what her plans are and she will be traveling.

## 2014-05-10 ENCOUNTER — Encounter: Payer: Self-pay | Admitting: *Deleted

## 2014-07-04 ENCOUNTER — Ambulatory Visit (INDEPENDENT_AMBULATORY_CARE_PROVIDER_SITE_OTHER): Payer: Medicare Other | Admitting: Physician Assistant

## 2014-07-04 VITALS — BP 142/74 | HR 59 | Temp 97.7°F | Resp 20 | Ht 61.75 in | Wt 152.1 lb

## 2014-07-04 DIAGNOSIS — S41102A Unspecified open wound of left upper arm, initial encounter: Secondary | ICD-10-CM | POA: Diagnosis not present

## 2014-07-04 DIAGNOSIS — T148 Other injury of unspecified body region: Secondary | ICD-10-CM | POA: Diagnosis not present

## 2014-07-04 DIAGNOSIS — W548XXA Other contact with dog, initial encounter: Secondary | ICD-10-CM | POA: Diagnosis not present

## 2014-07-04 MED ORDER — CLINDAMYCIN HCL 300 MG PO CAPS: 300.0000 mg | ORAL_CAPSULE | Freq: Three times a day (TID) | ORAL | Status: DC

## 2014-07-04 NOTE — Progress Notes (Signed)
   Subjective:    Patient ID: Vanessa Melton, female    DOB: May 14, 1932, 81 y.o.   MRN: [id number]  HPI Patient presents for laceration of right arm that was sustained yesterday afternoon when her dog jumped over her and scratched her arm. Wound bleed well and cleaned arm ok as she was at the beach. Denies pain, swelling, or redness. TDAP received 04/28/14.   Antibiotic med allergy to PCN.  Review of Systems  Constitutional: Negative.  Negative for fever.  Skin: Positive for wound. Negative for color change.       Objective:   Physical Exam  Constitutional: She is oriented to person, place, and time. She appears well-developed and well-nourished. No distress.  Blood pressure 142/74, pulse 59, temperature 97.7 F (36.5 C), temperature source Oral, resp. rate 20, height 5' 1.75" (1.568 m), weight 152 lb 2 oz (69.003 kg), SpO2 99 %.  HENT:  Head: Normocephalic and atraumatic.  Right Ear: External ear normal.  Left Ear: External ear normal.  Eyes: Conjunctivae are normal. Right eye exhibits no discharge. Left eye exhibits no discharge. No scleral icterus.  Pulmonary/Chest: Effort normal.  Neurological: She is alert and oriented to person, place, and time.  Skin: Skin is warm and dry. No rash noted. She is not diaphoretic. No erythema. No pallor.  4 cm triangular skin tear  Psychiatric: She has a normal mood and affect. Her behavior is normal. Judgment and thought content normal.   Procedure Consent obtained. 3 cc 1% lido local anesthesia. Cleaned with soap and water. Skin trimmed. Mupiricion applied. Clean dressing placed. Care instructions given.     Assessment & Plan:  1. Dog scratch 2. Open wound of arm, left, initial encounter Wound cleaned and dressed. No f/u necessary. Keep wound dry. Dressing change if gets wet. Tetanus UTD. Anticipatory guidance discussed. - clindamycin (CLEOCIN) 300 MG capsule; Take 1 capsule (300 mg total) by mouth 3 (three) times daily.  Dispense: 15  capsule; Refill: 0   Aleyza Salmi PA-C  [facility] 07/04/2014 12:17 PM

## 2014-07-04 NOTE — Patient Instructions (Signed)
The wound is cleansed, debrided of foreign material as much as possible, and dressed. The patient is alerted to watch for any signs of infection (redness, pus, pain, increased swelling or fever) and call if such occurs. Home wound care instructions are provided. Tetanus vaccination status reviewed: complete

## 2014-07-20 ENCOUNTER — Encounter: Payer: Self-pay | Admitting: *Deleted

## 2014-08-11 ENCOUNTER — Ambulatory Visit (INDEPENDENT_AMBULATORY_CARE_PROVIDER_SITE_OTHER): Payer: Medicare Other | Admitting: Internal Medicine

## 2014-08-11 ENCOUNTER — Encounter: Payer: Self-pay | Admitting: Internal Medicine

## 2014-08-11 ENCOUNTER — Other Ambulatory Visit (INDEPENDENT_AMBULATORY_CARE_PROVIDER_SITE_OTHER): Payer: Medicare Other

## 2014-08-11 ENCOUNTER — Ambulatory Visit (INDEPENDENT_AMBULATORY_CARE_PROVIDER_SITE_OTHER)
Admission: RE | Admit: 2014-08-11 | Discharge: 2014-08-11 | Disposition: A | Discharge status: Home / Self Care | Payer: Medicare Other | Source: Ambulatory Visit | Attending: Internal Medicine | Admitting: Internal Medicine

## 2014-08-11 VITALS — BP 130/72 | HR 72 | Temp 98.2°F | Resp 16 | Ht 62.0 in | Wt 150.0 lb

## 2014-08-11 DIAGNOSIS — J029 Acute pharyngitis, unspecified: Secondary | ICD-10-CM

## 2014-08-11 DIAGNOSIS — I10 Essential (primary) hypertension: Secondary | ICD-10-CM

## 2014-08-11 DIAGNOSIS — J020 Streptococcal pharyngitis: Secondary | ICD-10-CM

## 2014-08-11 DIAGNOSIS — E039 Hypothyroidism, unspecified: Secondary | ICD-10-CM

## 2014-08-11 LAB — LIPID PANEL
CHOL/HDL RATIO: 3
Cholesterol: 178 mg/dL (ref 0–200)
HDL: 60.5 mg/dL (ref >39.00)
LDL CALC: 105 mg/dL — AB (ref 0–99)
NONHDL: 117.5
Triglycerides: 64 mg/dL (ref 0.0–149.0)
VLDL: 12.8 mg/dL (ref 0.0–40.0)

## 2014-08-11 LAB — COMPREHENSIVE METABOLIC PANEL
ALT: 17 U/L (ref 0–35)
AST: 21 U/L (ref 0–37)
Albumin: 4.1 g/dL (ref 3.5–5.2)
Alkaline Phosphatase: 62 U/L (ref 39–117)
BILIRUBIN TOTAL: 0.6 mg/dL (ref 0.2–1.2)
BUN: 19 mg/dL (ref 6–23)
CO2: 30 mEq/L (ref 19–32)
Calcium: 10.1 mg/dL (ref 8.4–10.5)
Chloride: 102 mEq/L (ref 96–112)
Creatinine, Ser: 0.83 mg/dL (ref 0.40–1.20)
GFR: 70.3 mL/min (ref >60.00)
GLUCOSE: 94 mg/dL (ref 70–99)
Potassium: 4.5 mEq/L (ref 3.5–5.1)
SODIUM: 137 meq/L (ref 135–145)
TOTAL PROTEIN: 6.9 g/dL (ref 6.0–8.3)

## 2014-08-11 LAB — TSH: TSH: 0.36 u[IU]/mL (ref 0.35–4.50)

## 2014-08-11 MED ORDER — SUCRALFATE 1 GM/10ML PO SUSP: 1.0000 g | Freq: Three times a day (TID) | ORAL | Status: DC

## 2014-08-11 NOTE — Patient Instructions (Signed)
We have sent in the sucralfate liquid which you can drink before meals for 1 week to let the esophagus heal.   We will check the blood work today and the chest x-ray and call you back with the results.   If you are doing well come back in 1 year. If you have any problems or questions please feel free to call us.   Health Maintenance Adopting a healthy lifestyle and getting preventive care can go a long way to promote health and wellness. Talk with your health care provider about what schedule of regular examinations is right for you. This is a good chance for you to check in with your provider about disease prevention and staying healthy. In between checkups, there are plenty of things you can do on your own. Experts have done a lot of research about which lifestyle changes and preventive measures are most likely to keep you healthy. Ask your health care provider for more information. WEIGHT AND DIET  Eat a healthy diet  Be sure to include plenty of vegetables, fruits, low-fat dairy products, and lean protein.  Do not eat a lot of foods high in solid fats, added sugars, or salt.  Get regular exercise. This is one of the most important things you can do for your health.  Most adults should exercise for at least 150 minutes each week. The exercise should increase your heart rate and make you sweat (moderate-intensity exercise).  Most adults should also do strengthening exercises at least twice a week. This is in addition to the moderate-intensity exercise.  Maintain a healthy weight  Body mass index (BMI) is a measurement that can be used to identify possible weight problems. It estimates body fat based on height and weight. Your health care provider can help determine your BMI and help you achieve or maintain a healthy weight.  For females 33 years of age and older:   A BMI below 18.5 is considered underweight.  A BMI of 18.5 to 24.9 is normal.  A BMI of 25 to 29.9 is considered  overweight.  A BMI of 30 and above is considered obese.  Watch levels of cholesterol and blood lipids  You should start having your blood tested for lipids and cholesterol at 79 years of age, then have this test every 5 years.  You may need to have your cholesterol levels checked more often if:  Your lipid or cholesterol levels are high.  You are older than 79 years of age.  You are at high risk for heart disease.  CANCER SCREENING   Lung Cancer  Lung cancer screening is recommended for adults 19-71 years old who are at high risk for lung cancer because of a history of smoking.  A yearly low-dose CT scan of the lungs is recommended for people who:  Currently smoke.  Have quit within the past 15 years.  Have at least a 30-pack-year history of smoking. A pack year is smoking an average of one pack of cigarettes a day for 1 year.  Yearly screening should continue until it has been 15 years since you quit.  Yearly screening should stop if you develop a health problem that would prevent you from having lung cancer treatment.  Breast Cancer  Practice breast self-awareness. This means understanding how your breasts normally appear and feel.  It also means doing regular breast self-exams. Let your health care provider know about any changes, no matter how small.  If you are in your 22s or  71s, you should have a clinical breast exam (CBE) by a health care provider every 1-3 years as part of a regular health exam.  If you are 66 or older, have a CBE every year. Also consider having a breast X-ray (mammogram) every year.  If you have a family history of breast cancer, talk to your health care provider about genetic screening.  If you are at high risk for breast cancer, talk to your health care provider about having an MRI and a mammogram every year.  Breast cancer gene (BRCA) assessment is recommended for women who have family members with BRCA-related cancers. BRCA-related  cancers include:  Breast.  Ovarian.  Tubal.  Peritoneal cancers.  Results of the assessment will determine the need for genetic counseling and BRCA1 and BRCA2 testing. Cervical Cancer Routine pelvic examinations to screen for cervical cancer are no longer recommended for nonpregnant women who are considered low risk for cancer of the pelvic organs (ovaries, uterus, and vagina) and who do not have symptoms. A pelvic examination may be necessary if you have symptoms including those associated with pelvic infections. Ask your health care provider if a screening pelvic exam is right for you.   The Pap test is the screening test for cervical cancer for women who are considered at risk.  If you had a hysterectomy for a problem that was not cancer or a condition that could lead to cancer, then you no longer need Pap tests.  If you are older than 65 years, and you have had normal Pap tests for the past 10 years, you no longer need to have Pap tests.  If you have had past treatment for cervical cancer or a condition that could lead to cancer, you need Pap tests and screening for cancer for at least 20 years after your treatment.  If you no longer get a Pap test, assess your risk factors if they change (such as having a new sexual partner). This can affect whether you should start being screened again.  Some women have medical problems that increase their chance of getting cervical cancer. If this is the case for you, your health care provider may recommend more frequent screening and Pap tests.  The human papillomavirus (HPV) test is another test that may be used for cervical cancer screening. The HPV test looks for the virus that can cause cell changes in the cervix. The cells collected during the Pap test can be tested for HPV.  The HPV test can be used to screen women 81 years of age and older. Getting tested for HPV can extend the interval between normal Pap tests from three to five  years.  An HPV test also should be used to screen women of any age who have unclear Pap test results.  After 79 years of age, women should have HPV testing as often as Pap tests.  Colorectal Cancer  This type of cancer can be detected and often prevented.  Routine colorectal cancer screening usually begins at 79 years of age and continues through 79 years of age.  Your health care provider may recommend screening at an earlier age if you have risk factors for colon cancer.  Your health care provider may also recommend using home test kits to check for hidden blood in the stool.  A small camera at the end of a tube can be used to examine your colon directly (sigmoidoscopy or colonoscopy). This is done to check for the earliest forms of colorectal cancer.  Routine screening usually begins at age 32.  Direct examination of the colon should be repeated every 5-10 years through 79 years of age. However, you may need to be screened more often if early forms of precancerous polyps or small growths are found. Skin Cancer  Check your skin from head to toe regularly.  Tell your health care provider about any new moles or changes in moles, especially if there is a change in a mole's shape or color.  Also tell your health care provider if you have a mole that is larger than the size of a pencil eraser.  Always use sunscreen. Apply sunscreen liberally and repeatedly throughout the day.  Protect yourself by wearing long sleeves, pants, a wide-brimmed hat, and sunglasses whenever you are outside. HEART DISEASE, DIABETES, AND HIGH BLOOD PRESSURE   Have your blood pressure checked at least every 1-2 years. High blood pressure causes heart disease and increases the risk of stroke.  If you are between 50 years and 39 years old, ask your health care provider if you should take aspirin to prevent strokes.  Have regular diabetes screenings. This involves taking a blood sample to check your fasting  blood sugar level.  If you are at a normal weight and have a low risk for diabetes, have this test once every three years after 79 years of age.  If you are overweight and have a high risk for diabetes, consider being tested at a younger age or more often. PREVENTING INFECTION  Hepatitis B  If you have a higher risk for hepatitis B, you should be screened for this virus. You are considered at high risk for hepatitis B if:  You were born in a country where hepatitis B is common. Ask your health care provider which countries are considered high risk.  Your parents were born in a high-risk country, and you have not been immunized against hepatitis B (hepatitis B vaccine).  You have HIV or AIDS.  You use needles to inject street drugs.  You live with someone who has hepatitis B.  You have had sex with someone who has hepatitis B.  You get hemodialysis treatment.  You take certain medicines for conditions, including cancer, organ transplantation, and autoimmune conditions. Hepatitis C  Blood testing is recommended for:  Everyone born from [birth year] through 1963.  Anyone with known risk factors for hepatitis C. Sexually transmitted infections (STIs)  You should be screened for sexually transmitted infections (STIs) including gonorrhea and chlamydia if:  You are sexually active and are younger than 79 years of age.  You are older than 79 years of age and your health care provider tells you that you are at risk for this type of infection.  Your sexual activity has changed since you were last screened and you are at an increased risk for chlamydia or gonorrhea. Ask your health care provider if you are at risk.  If you do not have HIV, but are at risk, it may be recommended that you take a prescription medicine daily to prevent HIV infection. This is called pre-exposure prophylaxis (PrEP). You are considered at risk if:  You are sexually active and do not regularly use condoms or know  the HIV status of your partner(s).  You take drugs by injection.  You are sexually active with a partner who has HIV. Talk with your health care provider about whether you are at high risk of being infected with HIV. If you choose to begin PrEP, you should first  be tested for HIV. You should then be tested every 3 months for as long as you are taking PrEP.  PREGNANCY   If you are premenopausal and you may become pregnant, ask your health care provider about preconception counseling.  If you may become pregnant, take 400 to 800 micrograms (mcg) of folic acid every day.  If you want to prevent pregnancy, talk to your health care provider about birth control (contraception). OSTEOPOROSIS AND MENOPAUSE   Osteoporosis is a disease in which the bones lose minerals and strength with aging. This can result in serious bone fractures. Your risk for osteoporosis can be identified using a bone density scan.  If you are 31 years of age or older, or if you are at risk for osteoporosis and fractures, ask your health care provider if you should be screened.  Ask your health care provider whether you should take a calcium or vitamin D supplement to lower your risk for osteoporosis.  Menopause may have certain physical symptoms and risks.  Hormone replacement therapy may reduce some of these symptoms and risks. Talk to your health care provider about whether hormone replacement therapy is right for you.  HOME CARE INSTRUCTIONS   Schedule regular health, dental, and eye exams.  Stay current with your immunizations.   Do not use any tobacco products including cigarettes, chewing tobacco, or electronic cigarettes.  If you are pregnant, do not drink alcohol.  If you are breastfeeding, limit how much and how often you drink alcohol.  Limit alcohol intake to no more than 1 drink per day for nonpregnant women. One drink equals 12 ounces of beer, 5 ounces of wine, or 1 ounces of hard liquor.  Do not  use street drugs.  Do not share needles.  Ask your health care provider for help if you need support or information about quitting drugs.  Tell your health care provider if you often feel depressed.  Tell your health care provider if you have ever been abused or do not feel safe at home. Document Released: 08/05/2010 Document Revised: 06/06/2013 Document Reviewed: 12/22/2012 [facility] Patient Information 2015 [place], [place]. This information is not intended to replace advice given to you by your health care provider. Make sure you discuss any questions you have with your health care provider.

## 2014-08-11 NOTE — Progress Notes (Signed)
   Subjective:    Patient ID: Vanessa Melton, female    DOB: 02-Jul-1932, 81 y.o.   MRN: [id number]  HPI The patient is an 81 YO female who is new coming in for her thyroid and her blood pressure. She does not have a regular doctor and has been seeing only urgent care for some time. Denies new complaints or problems. No problems with her medicines. Denies heat or cold intolerance. No constipation or diarrhea. No chest pains or SOB. No headaches. Occasional migraine with weather change and chocolate. Still plays golf regularly (placed 3rd at a tournament in [place] recently) and shows her horse (placed several 1sts).   [facility], social history reviewed and updated.   Review of Systems  Constitutional: Negative for fever, activity change, appetite change, fatigue and unexpected weight change.  HENT: Negative.   Eyes: Negative.   Respiratory: Negative for cough, chest tightness, shortness of breath and wheezing.   Cardiovascular: Negative for chest pain, palpitations and leg swelling.  Gastrointestinal: Negative for nausea, abdominal pain, diarrhea, constipation and abdominal distention.  Musculoskeletal: Negative.   Skin: Negative.   Neurological: Negative.   Psychiatric/Behavioral: Negative.       Objective:   Physical Exam  Constitutional: She is oriented to person, place, and time. She appears well-developed and well-nourished.  HENT:  Head: Normocephalic and atraumatic.  Eyes: EOM are normal.  Neck: Normal range of motion.  Cardiovascular: Normal rate and regular rhythm.   Pulmonary/Chest: Effort normal. No respiratory distress. She has no wheezes. She has no rales.  Abdominal: Soft. She exhibits no distension. There is no tenderness.  Musculoskeletal: She exhibits no edema.  Neurological: She is alert and oriented to person, place, and time. Coordination normal.  Skin: Skin is warm and dry.  Psychiatric: She has a normal mood and affect.   Filed Vitals:   08/11/14 1054  BP:  130/72  Pulse: 72  Temp: 98.2 F (36.8 C)  TempSrc: Oral  Resp: 16  Height: 5\' 2"  (1.575 m)  Weight: 150 lb (68.04 kg)  SpO2: 98%      Assessment & Plan:

## 2014-08-12 NOTE — Assessment & Plan Note (Signed)
Checking CMP and adjust losartan 100 mg daily as needed. BP at goal today.

## 2014-08-12 NOTE — Assessment & Plan Note (Signed)
Check TSH, adjust synthroid 75 mcg daily as needed.

## 2014-08-15 ENCOUNTER — Telehealth: Payer: Self-pay | Admitting: Internal Medicine

## 2014-08-15 NOTE — Telephone Encounter (Signed)
Patient received a call today from us. Can you please call her?

## 2014-08-16 NOTE — Telephone Encounter (Signed)
Left message for patient to call me back. 

## 2014-08-18 ENCOUNTER — Telehealth: Payer: Self-pay

## 2014-08-18 ENCOUNTER — Other Ambulatory Visit: Payer: Self-pay | Admitting: Geriatric Medicine

## 2014-08-18 ENCOUNTER — Telehealth: Payer: Self-pay | Admitting: Internal Medicine

## 2014-08-18 MED ORDER — LEVOTHYROXINE SODIUM 75 MCG PO TABS: 75.0000 ug | ORAL_TABLET | Freq: Every day | ORAL | Status: DC

## 2014-08-18 MED ORDER — LOSARTAN POTASSIUM 100 MG PO TABS: 100.0000 mg | ORAL_TABLET | Freq: Every day | ORAL | Status: DC

## 2014-08-18 NOTE — Telephone Encounter (Signed)
Patient signed a release form for [facility] to send record [facility] Called and informed them that we are on Epic so they have access to her records dating back to August 2013. Also sent a fax as requested my the medical records dept. Form shredded.

## 2014-08-18 NOTE — Telephone Encounter (Signed)
Patient requesting a refill of  Losartan and levothyroxine sent to [facility] on [address]. She states that she is totally out

## 2014-08-18 NOTE — Telephone Encounter (Signed)
Sent to pharmacy 

## 2014-08-21 ENCOUNTER — Telehealth: Payer: Self-pay | Admitting: Internal Medicine

## 2014-08-21 NOTE — Telephone Encounter (Signed)
Received records from [facility] forwarded to Dr

## 2014-08-21 NOTE — Telephone Encounter (Signed)
Received records from [facility] forwarded 2 pages to Dr. Genella MechElizabeth Kollar 08/21/14 fbg.

## 2014-08-24 ENCOUNTER — Other Ambulatory Visit: Payer: Self-pay | Admitting: Family Medicine

## 2014-08-24 ENCOUNTER — Other Ambulatory Visit: Payer: Self-pay | Admitting: Internal Medicine

## 2014-08-24 MED ORDER — SUCRALFATE 1 GM/10ML PO SUSP: 1.0000 g | Freq: Three times a day (TID) | ORAL | Status: DC

## 2014-08-24 NOTE — Addendum Note (Signed)
Addended by: Tonye Becket on: 08/24/2014 12:53 PM   Modules accepted: Orders

## 2014-12-14 ENCOUNTER — Ambulatory Visit (INDEPENDENT_AMBULATORY_CARE_PROVIDER_SITE_OTHER): Payer: Medicare Other

## 2014-12-14 ENCOUNTER — Ambulatory Visit (INDEPENDENT_AMBULATORY_CARE_PROVIDER_SITE_OTHER): Payer: Medicare Other | Admitting: Family Medicine

## 2014-12-14 VITALS — BP 148/74 | HR 76 | Temp 98.2°F | Resp 16 | Ht 62.0 in | Wt 152.0 lb

## 2014-12-14 DIAGNOSIS — K219 Gastro-esophageal reflux disease without esophagitis: Secondary | ICD-10-CM | POA: Diagnosis not present

## 2014-12-14 DIAGNOSIS — R05 Cough: Secondary | ICD-10-CM | POA: Diagnosis not present

## 2014-12-14 DIAGNOSIS — M25561 Pain in right knee: Secondary | ICD-10-CM | POA: Diagnosis not present

## 2014-12-14 DIAGNOSIS — R059 Cough, unspecified: Secondary | ICD-10-CM

## 2014-12-14 LAB — POCT SEDIMENTATION RATE: POCT SED RATE: 15 mm/h (ref 0–22)

## 2014-12-14 MED ORDER — SUCRALFATE 1 GM/10ML PO SUSP: 1.0000 g | Freq: Three times a day (TID) | ORAL | Status: DC

## 2014-12-14 MED ORDER — MELOXICAM 7.5 MG PO TABS: 7.5000 mg | ORAL_TABLET | Freq: Every day | ORAL | Status: DC

## 2014-12-14 NOTE — Patient Instructions (Signed)
Rest the knee over the next week  Using a cane to avoid further strain of the leg  Take meloxicam 7.5 mg 1 daily after breakfast. Discontinue this if it upsets her stomach.  If problems persist would need to consider seeing an orthopedist.  Return at any time if acutely worse.   I will let you know the results of the labs in a few days

## 2014-12-14 NOTE — Progress Notes (Signed)
Patient ID: Vanessa Melton, female    DOB: 1932/06/19  Age: 81 y.o. MRN: [id number]  Chief Complaint  Patient presents with  . Knee Pain    right, x 2 days    Subjective:   Patient is here with a several day history of the right knee hurting her.No specific trauma but she has been doing work in her yard trying to get ready for the fall and winter. She recently got back from her trip to [place] where she was playing golf. Has never had gout. The knee did swell up and has been painful. She has had some hip pain because she has been hobbling around so much with the knee. Has not had major problems with the knee in the past. There is no known trauma.  She would also like a refill on her Carafate. She had called her prescribing physician for that and they have not returned her call. She takes it for reflux associated cough associated with her blood pressure medications. I will go ahead and prescribe it though will not do a further evaluation of it. If she is having further problems she can go back to the original prescriber.  Current allergies, medications, problem list, past/family and social histories reviewed.  Objective:  BP 148/74 mmHg  Pulse 76  Temp(Src) 98.2 F (36.8 C)  Resp 16  Ht 5\' 2"  (1.575 m)  Wt 152 lb (68.947 kg)  BMI 27.79 kg/m2  SpO2 98%  Right knee is mildly swollen in appearance. It has diffuse tenderness around the joint line. Small effusion can be palpated. Pain on flexion and extension. No warmth to touch. No major crepitance.  Assessment & Plan:   Assessment: 1. Right knee pain   2. Gastroesophageal reflux disease without esophagitis   3. Cough       Plan: X-ray right knee. Uric acid and sedimentation rate. Refill her Carafate for her.  Orders Placed This Encounter  Procedures  . DG Knee 1-2 Views Right    Order Specific Question:  Reason for Exam (SYMPTOM  OR DIAGNOSIS REQUIRED)    Answer:  right knee pain    Order Specific Question:  Preferred  imaging location?    Answer:  External  . Uric acid  . POCT SEDIMENTATION RATE    Meds ordered this encounter  Medications  . sucralfate (CARAFATE) 1 GM/10ML suspension    Sig: Take 10 mLs (1 g total) by mouth 3 (three) times daily with meals.    Dispense:  150 mL    Refill:  0  . meloxicam (MOBIC) 7.5 MG tablet    Sig: Take 1 tablet (7.5 mg total) by mouth daily.    Dispense:  20 tablet    Refill:  0   [facility] reading (PRIMARY) by  Dr. Alwyn RenHopper Normal knee with good spaces.   Kidney function good several months ago.  Will give low dose mobic 7.5.  Stop if stomach worse.     Patient Instructions  Rest the knee over the next week  Using a cane to avoid further strain of the leg  Take meloxicam 7.5 mg 1 daily after breakfast. Discontinue this if it upsets her stomach.  If problems persist would need to consider seeing an orthopedist.  Return at any time if acutely worse.   I will let you know the results of the labs in a few days     Return if symptoms worsen or fail to improve.   Jayce Kainz, MD 12/14/2014

## 2014-12-15 ENCOUNTER — Encounter: Payer: Self-pay | Admitting: Family Medicine

## 2014-12-15 LAB — URIC ACID: URIC ACID, SERUM: 4.8 mg/dL (ref 2.4–7.0)

## 2015-01-01 ENCOUNTER — Telehealth: Payer: Self-pay | Admitting: Family Medicine

## 2015-01-01 NOTE — Telephone Encounter (Signed)
Spoke to pt, scheduled her for a work-in on Wednesday @ 115.

## 2015-01-01 NOTE — Telephone Encounter (Signed)
States has hurt knee and would like to be worked in today or tomorrow if possible.  States knee is in a lot of pain.  I have scheduled patient for the 7th at 3:15 as a new patient.

## 2015-01-03 ENCOUNTER — Ambulatory Visit (INDEPENDENT_AMBULATORY_CARE_PROVIDER_SITE_OTHER): Payer: Medicare Other | Admitting: Family Medicine

## 2015-01-03 ENCOUNTER — Ambulatory Visit (INDEPENDENT_AMBULATORY_CARE_PROVIDER_SITE_OTHER)
Admission: RE | Admit: 2015-01-03 | Discharge: 2015-01-03 | Disposition: A | Discharge status: Home / Self Care | Payer: Medicare Other | Source: Ambulatory Visit | Attending: Family Medicine | Admitting: Family Medicine

## 2015-01-03 ENCOUNTER — Encounter: Payer: Self-pay | Admitting: Family Medicine

## 2015-01-03 ENCOUNTER — Ambulatory Visit (INDEPENDENT_AMBULATORY_CARE_PROVIDER_SITE_OTHER): Payer: Medicare Other

## 2015-01-03 ENCOUNTER — Ambulatory Visit (HOSPITAL_COMMUNITY)
Admission: RE | Admit: 2015-01-03 | Discharge: 2015-01-03 | Disposition: A | Discharge status: Home / Self Care | Payer: Medicare Other | Source: Ambulatory Visit | Attending: Family Medicine | Admitting: Family Medicine

## 2015-01-03 VITALS — BP 130/74 | HR 64 | Ht 62.0 in | Wt 151.0 lb

## 2015-01-03 DIAGNOSIS — I10 Essential (primary) hypertension: Secondary | ICD-10-CM | POA: Diagnosis not present

## 2015-01-03 DIAGNOSIS — M25561 Pain in right knee: Secondary | ICD-10-CM | POA: Diagnosis not present

## 2015-01-03 DIAGNOSIS — M79604 Pain in right leg: Secondary | ICD-10-CM | POA: Diagnosis not present

## 2015-01-03 DIAGNOSIS — M7989 Other specified soft tissue disorders: Secondary | ICD-10-CM | POA: Insufficient documentation

## 2015-01-03 DIAGNOSIS — M25552 Pain in left hip: Secondary | ICD-10-CM | POA: Diagnosis not present

## 2015-01-03 HISTORY — DX: Pain in right knee: M25.561

## 2015-01-03 NOTE — Progress Notes (Signed)
[name] [address] [place], [place] [address] Phone: [phone] Subjective:    I'm seeing this patient by the request  of:  Myrlene Broker, MD   CC: Right knee pain  [id number] Vanessa Melton is a 81 y.o. female coming in with complaint of right knee pain. Patient has had this pain for approximately 3 weeks. Seems to be worsening over the course of time and does not remember any true injury. It is associated with large swelling. States that the pain seems to be worse with any type of ambulation. Starting to hurt her even at rest. Patient was seen at an outside facility and did have x-rays. These were reviewed by me. X-ray show no significant bony abnormality at all. Patient was given anti-inflammatories which has helped minimally. Patient has been unable to do certain activities that she lost to do such as golfing. Rates the severity of pain a 7 out of 10. Has been borrowing someone's knee brace to help her with ambulation. Denies any fevers chills, any operable weight loss and denies any shortness of breath or chest pain.  Past Medical History  Diagnosis Date  . Allergy   . Arthritis   . Anxiety   . Hypertension    Past Surgical History  Procedure Laterality Date  . Cosmetic surgery    . Eye surgery    . Fracture surgery    . Eye muscle surgery     Social History  Substance Use Topics  . Smoking status: Former Smoker -- 5 years    Types: Cigarettes    Quit date: 02/03/1957  . Smokeless tobacco: Never Used  . Alcohol Use: 4.2 oz/week    7 Glasses of wine per week     Comment: social   Allergies  Allergen Reactions  . Beta Adrenergic Blockers Shortness Of Breath and Other (See Comments)    Chest pain, dizziness  . Ace Inhibitors Cough  . Codeine     Headache,SOB, rash  . Combigan [Brimonidine Tartrate-Timolol]     Chest pain  . Doxazosin   . Lisinopril Cough  . Penicillins     Headache, sob, rash   Family History    Problem Relation Age of Onset  . Aneurysm Brother   . Stroke Brother   . Colon cancer Mother   . Colon polyps Daughter   . Colon cancer Brother   . Emphysema Father         Past medical history, social, surgical and family history all reviewed in electronic medical record.   Review of Systems: No headache, visual changes, nausea, vomiting, diarrhea, constipation, dizziness, abdominal pain, skin rash, fevers, chills, night sweats, weight loss, swollen lymph nodes, body aches, joint swelling, muscle aches, chest pain, shortness of breath, mood changes.   Objective Blood pressure 130/74, pulse 64, height  (1.575 m), weight 151 lb (68.493 kg), SpO2 93 %.  General: No apparent distress alert and oriented x3 mood and affect normal, dressed appropriately.  HEENT: Pupils equal, extraocular movements intact  Respiratory: Patient's speak in full sentences and does not appear short of breath  Cardiovascular: lower extremity edema noted on right leg only, non tender, no erythema  Skin: Warm dry intact with no signs of infection or rash on extremities or on axial skeleton.  Abdomen: Soft nontender  Neuro: Cranial nerves II through XII are intact, neurovascularly intact in all extremities with 2+ DTRs and 2+ pulses.  Lymph: No lymphadenopathy of posterior or  anterior cervical chain or axillae bilaterally.  Gait normal with good balance and coordination.  MSK:  Non tender with full range of motion and good stability and symmetric strength and tone of shoulders, elbows, wrist, hip, and ankles bilaterally.  Knee: Right Trace swelling compared to the contralateral side. Nonpitting edema. Seems to go up to make by Used tenderness of the lower extremity as well as the upper thigh. Patient is tender surrounding the knee.Marland Kitchen. ROM full in flexion and extension and lower leg rotation. Ligaments with solid consistent endpoints including ACL, PCL, LCL, MCL. Pain though with testing  positive Mcmurray's,  Apley's, and Thessalonian tests. Non painful patellar compression. Patellar glide with mild crepitus. Patellar and quadriceps tendons unremarkable. Hamstring and quadriceps strength is normal.  Neurovascularly intact distally Contralateral knee unremarkable  MSK US performed of:  This study was ordered, performed, and interpreted by Terrilee FilesZach Carnisha Feltz D.O.  Knee: All structures visualized. Mild increasing Doppler flow and hypoechoic changes surrounding the medial meniscus but no true tear appreciated. No displacement noted. Patellar Tendon unremarkable on long and transverse views without effusion. No abnormality of prepatellar bursa. LCL and MCL unremarkable on long and transverse views. No abnormality of origin of medial or lateral head of the gastrocnemius.  IMPRESSION:  Questionable injury to the medial meniscus    Impression and Recommendations:     This case required medical decision making of moderate complexity.

## 2015-01-03 NOTE — Patient Instructions (Signed)
Good to see you Ice not heat We will get the doppler today  If negative then we will start exercises We will see you again on Friday if negative and try injecting knee Wear good shoes Meloixcam at night Friday at 10 am Get xray of hip on way out.

## 2015-01-03 NOTE — Assessment & Plan Note (Signed)
Patient does have right knee pain with lower extremity swelling. Seems to be very nonspecific. Patient's ultrasound were unremarkable. Patient's knee x-rays were unremarkable from an outside facility as well. Patient does have significant pain that seems to be out of proportion on exam today. I'm concerned the patient couldn't have some deep venous thrombosis that could be contributing to the pain. Doppler will be scheduled today. Depending on findings and we will discuss treatment options. Patient is not having any type of venous thrombosis at this time we will then consider treating her for more of a potential meniscal injury. Patient will come back again in 48 hours if no sign. Otherwise if there is a sign of clot we will treat appropriately. Patient knows if worsening symptoms occur that she should seek medical attention immediately.

## 2015-01-03 NOTE — Progress Notes (Signed)
Pre visit review using our clinic review tool, if applicable. No additional management support is needed unless otherwise documented below in the visit note. 

## 2015-01-05 ENCOUNTER — Ambulatory Visit (INDEPENDENT_AMBULATORY_CARE_PROVIDER_SITE_OTHER): Payer: Medicare Other | Admitting: Family Medicine

## 2015-01-05 ENCOUNTER — Encounter: Payer: Self-pay | Admitting: Family Medicine

## 2015-01-05 VITALS — BP 136/82 | HR 60 | Ht 62.0 in | Wt 151.0 lb

## 2015-01-05 DIAGNOSIS — M25561 Pain in right knee: Secondary | ICD-10-CM | POA: Diagnosis not present

## 2015-01-05 DIAGNOSIS — M7631 Iliotibial band syndrome, right leg: Secondary | ICD-10-CM

## 2015-01-05 NOTE — Assessment & Plan Note (Signed)
I reviewed with the patient the anatomy involved with ITB syndrome.  Given rehab program  - primarily ITB stretching program, core stability program, gluteus medius strengthening, hip flexion strengthening, and core. Additional proprioception and mild plyometrics program reviewed.  Reviewed plan of care and rehab is the primary treatment in this condition.

## 2015-01-05 NOTE — Progress Notes (Signed)
Pre visit review using our clinic review tool, if applicable. No additional management support is needed unless otherwise documented below in the visit note. 

## 2015-01-05 NOTE — Patient Instructions (Signed)
Good to see you Exercises and ice after the gym pennsaid pinkie amount topically 2 times daily as needed.  Good shoes with rigid bottom.  Dierdre HarnessKeen, Dansko, Merrell or New balance greater then 700 Stay active Seem me again in 2-3 weeks if still in pain and we will try an injection in the knee.  Happy holidays!

## 2015-01-05 NOTE — Progress Notes (Signed)
[facility] Jesusmanuel Erbes D.[facility]. [facility] [address] [place], [place] [address] Phone: [phone] Subjective:     CC: Right knee pain follow up  FAO:[id number]:Subjective Vanessa Melton is a 81 y.o. female coming in with complaint of right knee pain. Patient has had this pain for approximately 3 weeks. Seems to be worsening over the course of time and does not remember any true injury. It is associated with large swelling. States that the pain seems to be worse with any type of ambulation. Starting to hurt her even at rest. Patient was seen at an outside facility and did have x-rays. These were reviewed by me. X-ray show no significant bony abnormality. Patient was seen her pain was seemed to be out of proportion and did have positive pain with compression of the calf. Patient was sent for Doppler to rule out clots which was negative. Patient continues to have pain. Patient states this seems to be worse with significant activity. Continues of to go to the gym on a daily basis. Cannot to her other walking. Seems to be more on the lateral aspect of the knee as well as the lateral aspect of the leg. Denies any back pain that is associated with it. Denies giving out on her but states that they're sometimes a feeling of instability.   Past Medical History  Diagnosis Date  . Allergy   . Arthritis   . Anxiety   . Hypertension    Past Surgical History  Procedure Laterality Date  . Cosmetic surgery    . Eye surgery    . Fracture surgery    . Eye muscle surgery     Social History  Substance Use Topics  . Smoking status: Former Smoker -- 5 years    Types: Cigarettes    Quit date: 02/03/1957  . Smokeless tobacco: Never Used  . Alcohol Use: 4.2 oz/week    7 Glasses of wine per week     Comment: social   Allergies  Allergen Reactions  . Beta Adrenergic Blockers Shortness Of Breath and Other (See Comments)    Chest pain, dizziness  . Ace Inhibitors Cough  . Codeine     Headache,SOB, rash  .  Combigan [Brimonidine Tartrate-Timolol]     Chest pain  . Doxazosin   . Lisinopril Cough  . Penicillins     Headache, sob, rash   Family History  Problem Relation Age of Onset  . Aneurysm Brother   . Stroke Brother   . Colon cancer Mother   . Colon polyps Daughter   . Colon cancer Brother   . Emphysema Father         Past medical history, social, surgical and family history all reviewed in electronic medical record.   Review of Systems: No headache, visual changes, nausea, vomiting, diarrhea, constipation, dizziness, abdominal pain, skin rash, fevers, chills, night sweats, weight loss, swollen lymph nodes, body aches, joint swelling, muscle aches, chest pain, shortness of breath, mood changes.   Objective Blood pressure 136/82, pulse 60, height 5\' 2"  (1.575 m), weight 151 lb (68.493 kg), SpO2 96 %.  General: No apparent distress alert and oriented x3 mood and affect normal, dressed appropriately.  HEENT: Pupils equal, extraocular movements intact  Respiratory: Patient's speak in full sentences and does not appear short of breath  Cardiovascular: lower extremity edema noted on right leg only, non tender, no erythema  Skin: Warm dry intact with no signs of infection or rash on extremities or on axial skeleton.  Abdomen: Soft nontender  Neuro: Cranial nerves II through XII are intact, neurovascularly intact in all extremities with 2+ DTRs and 2+ pulses.  Lymph: No lymphadenopathy of posterior or anterior cervical chain or axillae bilaterally.  Gait normal with good balance and coordination.  MSK:  Non tender with full range of motion and good stability and symmetric strength and tone of shoulders, elbows, wrist, hip, and ankles bilaterally.  Knee: Right Decrease swelling from previous exam Continued tenderness over the medial joint line as well as just superior to the lateral joint line. ROM full in flexion and extension and lower leg rotation. Ligaments with solid consistent  endpoints including ACL, PCL, LCL, MCL. Pain though with testing significant tightness of the iliotibial band with Pearlean Brownie test. No groin pain with movement. positive Mcmurray's, Apley's, and Thessalonian tests. Non painful patellar compression. Patellar glide with mild crepitus. Patellar and quadriceps tendons unremarkable. Hamstring and quadriceps strength is normal.  Neurovascularly intact distally Contralateral knee unremarkable  Procedure Note 97110; 15 minutes spent for Therapeutic exercises as stated in above notes.  This included exercises focusing on stretching, strengthening, with significant focus on eccentric aspects.  I reviewed with the patient the anatomy involved with ITB syndrome.  Given rehab program  - primarily ITB stretching program, core stability program, gluteus medius strengthening, hip flexion strengthening, and core. Additional proprioception and mild plyometrics program reviewed.  Reviewed plan of care and rehab is the primary treatment in this condition.  Proper technique shown and discussed handout in great detail with ATC.  All questions were discussed and answered.     Impression and Recommendations:     This case required medical decision making of moderate complexity.

## 2015-01-05 NOTE — Assessment & Plan Note (Signed)
Differential is still quite broad. Most likely is still a medial meniscal injury that does not go to the periphery so we are unable to see on ultrasound. Patient's x-rays were fairly unremarkable for causes of pain. Likely patient's Doppler was negative for deep venous thrombosis. Patient does have significant tightness of the distal iliotibial band and discomfort there. Patient given exercises for this to see if it'll be beneficial. We discussed icing regimen. Given trial of topical anti-inflammatories. Patient does not make any significant improvement in the next 2-3 weeks and like her to come back and we will try an injection in the knee for diagnostic as well as hopefully therapeutic purposes. Work with Event organiserathletic trainer to learn home exercises in greater detail.

## 2015-01-10 ENCOUNTER — Ambulatory Visit: Payer: Medicare Other | Admitting: Family Medicine

## 2015-01-22 ENCOUNTER — Ambulatory Visit: Payer: Medicare Other | Admitting: Family Medicine

## 2015-03-14 ENCOUNTER — Ambulatory Visit (INDEPENDENT_AMBULATORY_CARE_PROVIDER_SITE_OTHER): Payer: Medicare Other | Admitting: Family Medicine

## 2015-03-14 ENCOUNTER — Encounter: Payer: Self-pay | Admitting: Family Medicine

## 2015-03-14 VITALS — BP 111/52 | HR 84 | Temp 97.9°F | Ht 62.0 in | Wt 147.4 lb

## 2015-03-14 DIAGNOSIS — K219 Gastro-esophageal reflux disease without esophagitis: Secondary | ICD-10-CM

## 2015-03-14 DIAGNOSIS — R05 Cough: Secondary | ICD-10-CM | POA: Diagnosis not present

## 2015-03-14 DIAGNOSIS — E038 Other specified hypothyroidism: Secondary | ICD-10-CM | POA: Diagnosis not present

## 2015-03-14 DIAGNOSIS — I10 Essential (primary) hypertension: Secondary | ICD-10-CM

## 2015-03-14 DIAGNOSIS — R059 Cough, unspecified: Secondary | ICD-10-CM

## 2015-03-14 DIAGNOSIS — E034 Atrophy of thyroid (acquired): Secondary | ICD-10-CM

## 2015-03-14 MED ORDER — SUCRALFATE 1 GM/10ML PO SUSP: 1.0000 g | Freq: Three times a day (TID) | ORAL | Status: DC

## 2015-03-14 NOTE — Assessment & Plan Note (Signed)
Seems stable. Generalized check TSH once a year.

## 2015-03-14 NOTE — Assessment & Plan Note (Signed)
After discussion with her seems like there may be some component of silent reflux. She did well on sucralfate liquid before so will restart that, she'll call if it gets worse and then I'll see her back in about 4 weeks. She's doing great, then I probably treated for 8 weeks and then do a trial off the medicine. I would also get records from her EGD done within the last year.

## 2015-03-14 NOTE — Progress Notes (Signed)
   Subjective:    Patient ID: Vanessa Melton, female    DOB: October 27, 1932, 82 y.o.   MRN: [id number]  HPI The patient my practice #1. NW:GNFAO: This started when she was placed on lisinopril but it improved significantly when they switched her to Cozaar. She's been on Cozaar since then and the cough has never quite gone away. It also improved from they put her on sucralfate for a few weeks but then she ran out of that and the cough came back, nondistended is when she was on lisinopril but still present every day, sometimes at night. Nonproductive. She denies shortness of breath. She's quite active working out the gym 1-2 hours a day, rides her horse. #2. Hypertension: Evidently she had some problems getting a medication that did not cause side effects so she got to stay on the Cozaar. #3. Has been on the same dose of levothyroxine for many years. #4. She broke out an itchy painful rash on her upper back about 2 weeks ago. It seems to be resolving. She thinks it may have been shingles. She's puzzled by this and she did get the Zostavax.  PERTINENT  PMH / PSH: I have reviewed the patient's medications, allergies, past medical and surgical history. Pertinent findings that relate to today's visit / issues include: She reports EGD and colonoscopy both within the last year. Colonoscopy evidently revealed 3 polyps. I don't have those records, we'll have to get them. The EGD revealed some type of problem--- she's not exactly sure what it was. Doesn't think it was a stricture. Says it may of been a polyp. If get those records as well.  Review of Systems No unusual weight change, fever, sweats, chills. Does not have classic heartburn symptoms. Cough as per history of present illness. No shortness of breath, no change in exercise tolerance. No chest pain. No change in bowel or bladder habits, no blood in stool. No nausea.    Objective:   Physical Exam Vital signs reviewed GENERALl: Well developed, well  nourished, in no acute distress. NECK: Supple, FROM, without lymphadenopathy.  THYROID: normal without nodularity CAROTID ARTERIES: without bruits LUNGS: clear to auscultation bilaterally. No wheezes or rales. HEART: Regular rate and rhythm, no murmurs ABDOMEN: soft with positive bowel sounds MSK: MOE x 4 SKIN healing rash on the upper back consistent with zoster NEURO: no focal deficits        Assessment & Plan:

## 2015-03-14 NOTE — Assessment & Plan Note (Signed)
Blood pressure seems well controlled so we'll continue the current medication. We'll get some lab work at next office visit. Initially I thought about taking her off the ARB, but given the large number of medications a try before they found something that worked, I think will pressure cough from the different perspective.

## 2015-03-26 ENCOUNTER — Encounter: Payer: Self-pay | Admitting: Family Medicine

## 2015-04-25 ENCOUNTER — Encounter: Payer: Self-pay | Admitting: Family Medicine

## 2015-04-25 ENCOUNTER — Ambulatory Visit (INDEPENDENT_AMBULATORY_CARE_PROVIDER_SITE_OTHER): Payer: Medicare Other | Admitting: Family Medicine

## 2015-04-25 VITALS — BP 129/53 | HR 65 | Temp 97.8°F | Ht 62.0 in | Wt 149.4 lb

## 2015-04-25 DIAGNOSIS — E038 Other specified hypothyroidism: Secondary | ICD-10-CM | POA: Diagnosis not present

## 2015-04-25 DIAGNOSIS — K219 Gastro-esophageal reflux disease without esophagitis: Secondary | ICD-10-CM

## 2015-04-25 DIAGNOSIS — I10 Essential (primary) hypertension: Secondary | ICD-10-CM

## 2015-04-25 DIAGNOSIS — F40248 Other situational type phobia: Secondary | ICD-10-CM | POA: Diagnosis not present

## 2015-04-25 DIAGNOSIS — E034 Atrophy of thyroid (acquired): Secondary | ICD-10-CM

## 2015-04-25 LAB — TSH: TSH: 1.79 m[IU]/L

## 2015-04-25 LAB — BASIC METABOLIC PANEL WITH GFR
BUN: 17 mg/dL (ref 7–25)
CHLORIDE: 104 mmol/L (ref 98–110)
CO2: 24 mmol/L (ref 20–31)
Calcium: 9.6 mg/dL (ref 8.6–10.4)
Creat: 0.78 mg/dL (ref 0.60–0.88)
GFR, Est African American: 83 mL/min (ref >=60)
GFR, Est Non African American: 72 mL/min (ref >=60)
Glucose, Bld: 90 mg/dL (ref 65–99)
POTASSIUM: 4.3 mmol/L (ref 3.5–5.3)
Sodium: 139 mmol/L (ref 135–146)

## 2015-04-25 LAB — LDL CHOLESTEROL, DIRECT: Direct LDL: 88 mg/dL (ref <130)

## 2015-04-25 MED ORDER — ALPRAZOLAM 0.5 MG PO TABS: ORAL_TABLET | ORAL | Status: DC

## 2015-04-25 NOTE — Patient Instructions (Signed)
Great to see you!   

## 2015-04-26 DIAGNOSIS — F40248 Other situational type phobia: Secondary | ICD-10-CM | POA: Insufficient documentation

## 2015-04-26 NOTE — Assessment & Plan Note (Signed)
Check TSH. Will plan to check this to 12 months. She has been stable on current dose for quite a while and has no symptoms.

## 2015-04-26 NOTE — Progress Notes (Signed)
   Subjective:    Patient ID: Jairo BenElizabeth J Marrone, female    DOB: Jun 27, 1932, 82 y.o.   MRN: [id number]  HPI #1. Cough: We had started her on sucralfate. This is essentially limited her cough to once a day. The cough she does have is still nonproductive. She's quite happy with the overall treatment. #2. Occasionally uses benzodiazepine for pelvic speaking. She has done last public speaking in the last 3 years and notices that she is less nervous that would still like to have some on hand for any upcoming events. She does not use it other than for public speaking.   Review of Systems Negative for abdominal pain. Negative for reflux symptoms. No unusual weight loss.    Objective:   Physical Exam  Vital signs are reviewed GEN.: Well-developed female no acute distress CV: Regular rate and rhythm LUNGS: Normal restpiratory rate PSYCHIATRIC: Alert and oriented 4. Affects interactive. Speech is normal fluency in content. Judgment is normal. Memory recent and remote is intact.      Assessment & Plan:

## 2015-04-26 NOTE — Assessment & Plan Note (Signed)
Continue ARB antihypertensive. Check labs.

## 2015-04-26 NOTE — Assessment & Plan Note (Signed)
Refilled that today.

## 2015-04-26 NOTE — Assessment & Plan Note (Signed)
She has had great response to the sucralfate. We discussed at length today doing a short trial that is in 8 weeks versus stopping it at that time point versus stopping it and then using it when necessary. She has plenty of refills I will leave that up to her. Should she have any return of symptoms or other issue she will let me know. Given that the cough improved with this, I think we can say that it is not related to her ARB antihypertensive and we will continue that.

## 2015-05-01 ENCOUNTER — Encounter: Payer: Self-pay | Admitting: Family Medicine

## 2015-05-17 ENCOUNTER — Other Ambulatory Visit: Payer: Self-pay | Admitting: Family Medicine

## 2015-05-19 ENCOUNTER — Other Ambulatory Visit: Payer: Self-pay | Admitting: Family Medicine

## 2015-05-21 ENCOUNTER — Telehealth: Payer: Self-pay | Admitting: Family Medicine

## 2015-05-21 DIAGNOSIS — K219 Gastro-esophageal reflux disease without esophagitis: Secondary | ICD-10-CM

## 2015-05-21 DIAGNOSIS — R05 Cough: Secondary | ICD-10-CM

## 2015-05-21 DIAGNOSIS — R059 Cough, unspecified: Secondary | ICD-10-CM

## 2015-05-21 NOTE — Telephone Encounter (Signed)
Pt called and would like to speak to one of the doctors nurses about her medication. jw

## 2015-05-29 NOTE — Telephone Encounter (Signed)
Dear [name] Team Can u see what she needs? THANKS! Denny LevySara Georganna Melton

## 2015-05-30 MED ORDER — LEVOTHYROXINE SODIUM 75 MCG PO TABS: 75.0000 ug | ORAL_TABLET | Freq: Every day | ORAL | Status: DC

## 2015-05-30 MED ORDER — SUCRALFATE 1 GM/10ML PO SUSP: 1.0000 g | Freq: Three times a day (TID) | ORAL | Status: DC

## 2015-05-30 MED ORDER — LOSARTAN POTASSIUM 100 MG PO TABS: 100.0000 mg | ORAL_TABLET | Freq: Every day | ORAL | Status: DC

## 2015-05-30 NOTE — Telephone Encounter (Signed)
Contacted pt and basically she is going to need a refill on her thyroid medicine.  She stated that her previous doctor had written her Rx's before and that they are all getting ready to expire for refills and she wants to be sure that no time lapses between Rx. Lamonte SakaiZimmerman Rumple, April D, [place]

## 2015-07-30 ENCOUNTER — Encounter: Payer: Self-pay | Admitting: Family Medicine

## 2016-05-14 ENCOUNTER — Other Ambulatory Visit: Payer: Self-pay | Admitting: Family Medicine

## 2016-05-14 NOTE — Telephone Encounter (Signed)
Pt calling to request refill of:  Name of Medication(s):  losartan Last date of OV:  04-25-15 Pharmacy:  [facility] [address]  Will route refill request to Clinic RN.  Discussed with patient policy to call pharmacy for future refills.  Also, discussed refills may take up to 48 hours to approve or deny.  Markus Jarvis

## 2016-05-15 MED ORDER — LOSARTAN POTASSIUM 100 MG PO TABS: 100.0000 mg | ORAL_TABLET | Freq: Every day | ORAL | 3 refills | Status: DC

## 2016-05-21 ENCOUNTER — Ambulatory Visit (INDEPENDENT_AMBULATORY_CARE_PROVIDER_SITE_OTHER): Payer: Medicare Other | Admitting: Family Medicine

## 2016-05-21 ENCOUNTER — Encounter: Payer: Self-pay | Admitting: Family Medicine

## 2016-05-21 VITALS — HR 65 | Temp 97.6°F | Ht 62.0 in | Wt 150.2 lb

## 2016-05-21 DIAGNOSIS — Z0000 Encounter for general adult medical examination without abnormal findings: Secondary | ICD-10-CM

## 2016-05-21 DIAGNOSIS — I10 Essential (primary) hypertension: Secondary | ICD-10-CM

## 2016-05-21 DIAGNOSIS — E034 Atrophy of thyroid (acquired): Secondary | ICD-10-CM | POA: Diagnosis not present

## 2016-05-21 DIAGNOSIS — Z23 Encounter for immunization: Secondary | ICD-10-CM | POA: Diagnosis not present

## 2016-05-21 DIAGNOSIS — H4089 Other specified glaucoma: Secondary | ICD-10-CM | POA: Diagnosis not present

## 2016-05-21 DIAGNOSIS — K219 Gastro-esophageal reflux disease without esophagitis: Secondary | ICD-10-CM

## 2016-05-21 DIAGNOSIS — R05 Cough: Secondary | ICD-10-CM

## 2016-05-21 DIAGNOSIS — R059 Cough, unspecified: Secondary | ICD-10-CM

## 2016-05-21 MED ORDER — LEVOTHYROXINE SODIUM 75 MCG PO TABS: 75.0000 ug | ORAL_TABLET | Freq: Every day | ORAL | 3 refills | Status: DC

## 2016-05-21 MED ORDER — SUCRALFATE 1 GM/10ML PO SUSP: 1.0000 g | Freq: Three times a day (TID) | ORAL | 12 refills | Status: DC

## 2016-05-21 NOTE — Patient Instructions (Signed)
I would recommend making an appointment with Dr. Loreta Ave sometime later this year and discussing colon cancer screening, including Cologuard.  I will send you a note about yoru labs. GREAT TO SEE YOU!

## 2016-05-22 ENCOUNTER — Encounter: Payer: Self-pay | Admitting: Family Medicine

## 2016-05-22 DIAGNOSIS — Z0000 Encounter for general adult medical examination without abnormal findings: Secondary | ICD-10-CM | POA: Insufficient documentation

## 2016-05-22 LAB — COMPREHENSIVE METABOLIC PANEL
ALT: 15 IU/L (ref 0–32)
AST: 17 IU/L (ref 0–40)
Albumin/Globulin Ratio: 2 (ref 1.2–2.2)
Albumin: 4.3 g/dL (ref 3.5–4.7)
Alkaline Phosphatase: 65 IU/L (ref 39–117)
BUN / CREAT RATIO: 26 (ref 12–28)
BUN: 21 mg/dL (ref 8–27)
Bilirubin Total: 0.4 mg/dL (ref 0.0–1.2)
CO2: 22 mmol/L (ref 18–29)
CREATININE: 0.82 mg/dL (ref 0.57–1.00)
Calcium: 9.9 mg/dL (ref 8.7–10.3)
Chloride: 101 mmol/L (ref 96–106)
GFR calc non Af Amer: 67 mL/min/{1.73_m2} (ref >59)
GFR, EST AFRICAN AMERICAN: 78 mL/min/{1.73_m2} (ref >59)
Globulin, Total: 2.2 g/dL (ref 1.5–4.5)
Glucose: 91 mg/dL (ref 65–99)
POTASSIUM: 4.5 mmol/L (ref 3.5–5.2)
SODIUM: 140 mmol/L (ref 134–144)
TOTAL PROTEIN: 6.5 g/dL (ref 6.0–8.5)

## 2016-05-22 LAB — TSH: TSH: 2.89 u[IU]/mL (ref 0.450–4.500)

## 2016-05-22 LAB — VITAMIN D 25 HYDROXY (VIT D DEFICIENCY, FRACTURES): Vit D, 25-Hydroxy: 33.1 ng/mL (ref 30.0–100.0)

## 2016-05-22 NOTE — Assessment & Plan Note (Signed)
Continue current medications. We'll check some labs today. She's doing quite well. Continue exercise regimen.

## 2016-05-22 NOTE — Assessment & Plan Note (Signed)
She has had some type of surgical procedure on the right eye and is being followed closely by her ophthalmologist. Potentially this will have alleviated her problems with increased pressure in the right eye. This is good news is no the drops seem to work for her. She continues follow-up with Dr. Hazle Quant.

## 2016-05-22 NOTE — Assessment & Plan Note (Signed)
Very active and I recommend she continue that. I will check a vitamin D level. Do not see that she has had any of the Pneumovax will start that. All other health maintenance issues are up-to-date. I did discuss with her the colon cancer screening and she will follow-up with her gastrointestinal physician regarding that.

## 2016-05-22 NOTE — Assessment & Plan Note (Signed)
The addition of Carafate really improved her symptoms so we will continue that currently. Kidney function is normal. We are rechecking today

## 2016-05-22 NOTE — Progress Notes (Signed)
    CHIEF COMPLAINT / HPI:   well check Some refills Has questions about colon cancer screening. Her last screening was in 2015 or 2016 by her report. Her mother died of metastatic colon cancer when she was in her 54s and her brother died similarly when he was in his 48s, so she's not real sure she should follow the standard guidelines for screening. Otherwise doing well. Continues to work out regularly doing both cardiovascular and weight training. Retired. Has several trips planned.  REVIEW OF SYSTEMS:  Review of Systems  Constitutional: Negative for activity chang; no  appetite change and no unexpected weight change.  Eyes: Negative for eye pain and no visual disturbance.  Neck: denies neck pain; no swallowing problems CV: No chest pain, no shortness of breath, no lower extremity edema. No change in exercise tolerance Respiratory: Negative for cough or wheezing.  No shortness of breath. Gastrointestinal: Negative for abdominal pain, no diarrhea and no  constipation.  Genitourinary: Negative for decreased urine volume and  no difficulty urinating.  Musculoskeletal: Negative for arthralgias. No muscle weakness. Skin: Negative for rash.  Psychiatric/Behavioral: Negative for behavioral problems; no sleep disturbance and no  agitation.     OBJECTIVE:  Vital signs are reviewed.   Vital signs reviewed GENERALl: Well developed, well nourished, in no acute distress. HEENT: PERRLA, EOMI, sclerae are nonicteric NECK: Supple, FROM, without lymphadenopathy.  THYROID: normal without nodularity CAROTID ARTERIES: without bruits LUNGS: clear to auscultation bilaterally. No wheezes or rales. Normal respiratory effort HEART: Regular rate and rhythm, no murmurs. Distal pulses are bilaterally symmetrical, 2+. ABDOMEN: soft with positive bowel sounds. No masses noted MSK: MOE x 4. Normal muscle strength, bulk and tone. SKIN no rash. Normal temperature. No worrisome lesions noted on sun exposed skin  areas which were examined in entirety. NEURO: no focal deficits. Normal gait. Normal balance. PSYCHIATRIC: Alert and oriented 4. Affects interactive. Speech is normal fluency in content. Recent remote memory is intact. Judgment is normal.   ASSESSMENT / PLAN: Please see problem oriented charting for details

## 2016-05-22 NOTE — Assessment & Plan Note (Signed)
Refill medicines. We'll check her TSH. She seems to be doing quite well, weight is stable and she's asymptomatic.

## 2016-05-23 ENCOUNTER — Encounter: Payer: Self-pay | Admitting: Family Medicine

## 2016-07-22 ENCOUNTER — Ambulatory Visit (HOSPITAL_COMMUNITY)
Admission: EM | Admit: 2016-07-22 | Discharge: 2016-07-22 | Disposition: A | Discharge status: Home / Self Care | Payer: Medicare Other | Attending: Family Medicine | Admitting: Family Medicine

## 2016-07-22 ENCOUNTER — Ambulatory Visit (HOSPITAL_COMMUNITY): Payer: Medicare Other

## 2016-07-22 ENCOUNTER — Encounter (HOSPITAL_COMMUNITY): Payer: Self-pay | Admitting: Family Medicine

## 2016-07-22 ENCOUNTER — Ambulatory Visit (INDEPENDENT_AMBULATORY_CARE_PROVIDER_SITE_OTHER): Payer: Medicare Other

## 2016-07-22 DIAGNOSIS — K5900 Constipation, unspecified: Secondary | ICD-10-CM

## 2016-07-22 DIAGNOSIS — R110 Nausea: Secondary | ICD-10-CM

## 2016-07-22 DIAGNOSIS — R109 Unspecified abdominal pain: Secondary | ICD-10-CM

## 2016-07-22 MED ORDER — ONDANSETRON 4 MG PO TBDP
4.0000 mg | ORAL_TABLET | Freq: Once | ORAL | Status: AC
  Administered 2016-07-22: 4 mg via ORAL

## 2016-07-22 MED ORDER — POLYETHYLENE GLYCOL 3350 17 G PO PACK: 17.0000 g | PACK | Freq: Two times a day (BID) | ORAL | 0 refills | Status: DC

## 2016-07-22 MED ORDER — ONDANSETRON 4 MG PO TBDP: 4.0000 mg | ORAL_TABLET | Freq: Three times a day (TID) | ORAL | 0 refills | Status: DC | PRN

## 2016-07-22 MED ORDER — ONDANSETRON 4 MG PO TBDP
ORAL_TABLET | ORAL | Status: AC
  Filled 2016-07-22: qty 1

## 2016-07-22 NOTE — ED Provider Notes (Signed)
CSN: [id number]     Arrival date & time 07/22/16  1433 History   None    Chief Complaint  Patient presents with  . Abdominal Pain   (Consider location/radiation/quality/duration/timing/severity/associated sxs/prior Treatment) Patient c/o abdominal cramping and nausea.  Patient states her bm's have been hard and she feels constipated.   The history is provided by the patient.  Abdominal Pain  Pain location:  Generalized Pain quality: aching   Pain radiates to:  Does not radiate Onset quality:  Sudden Duration:  2 days Timing:  Constant Progression:  Worsening Chronicity:  New Relieved by:  Nothing Associated symptoms: constipation     Past Medical History:  Diagnosis Date  . Allergy   . Anxiety   . Arthritis   . Hypertension    Past Surgical History:  Procedure Laterality Date  . COSMETIC SURGERY    . EYE MUSCLE SURGERY    . EYE SURGERY    . FRACTURE SURGERY     Family History  Problem Relation Age of Onset  . Colon cancer Mother   . Emphysema Father   . Aneurysm Brother   . Stroke Brother   . Colon polyps Daughter   . Colon cancer Brother    Social History  Substance Use Topics  . Smoking status: Former Smoker    Years: 5.00    Types: Cigarettes    Quit date: 02/03/1957  . Smokeless tobacco: Never Used  . Alcohol use 4.2 oz/week    7 Glasses of wine per week     Comment: social   OB History    No data available     Review of Systems  Constitutional: Negative.   HENT: Negative.   Eyes: Negative.   Respiratory: Negative.   Cardiovascular: Negative.   Gastrointestinal: Positive for abdominal pain and constipation.  Endocrine: Negative.   Genitourinary: Negative.   Musculoskeletal: Negative.   Allergic/Immunologic: Negative.   Neurological: Negative.   Hematological: Negative.   Psychiatric/Behavioral: Negative.     Allergies  Beta adrenergic blockers; Ace inhibitors; Codeine; Combigan [brimonidine tartrate-timolol]; Doxazosin; Lisinopril; and  Penicillins  Home Medications   Prior to Admission medications   Medication Sig Start Date End Date Taking? Authorizing Provider  ALPRAZolam Prudy Feeler(XANAX) 0.5 MG tablet Take one by mouth one hour prior to public speaking prn 04/25/15   Nestor RampNeal, Sara L, MD  levothyroxine (SYNTHROID, LEVOTHROID) 75 MCG tablet Take 1 tablet (75 mcg total) by mouth daily. 05/21/16   Nestor RampNeal, Sara L, MD  losartan (COZAAR) 100 MG tablet Take 1 tablet (100 mg total) by mouth daily. 05/15/16   Nestor RampNeal, Sara L, MD  ondansetron (ZOFRAN ODT) 4 MG disintegrating tablet Take 1 tablet (4 mg total) by mouth every 8 (eight) hours as needed for nausea or vomiting. 07/22/16   Deatra Canterxford, Arianne Klinge J, FNP  polyethylene glycol (GAVILYTE-G) 236 g solution  03/02/14   [provider]  polyethylene glycol (MIRALAX) packet Take 17 g by mouth 2 (two) times daily. 07/22/16   Deatra Canterxford, Elfa Wooton J, FNP  sucralfate (CARAFATE) 1 GM/10ML suspension Take 10 mLs (1 g total) by mouth 4 (four) times daily -  with meals and at bedtime. 05/21/16   Nestor RampNeal, Sara L, MD   Meds Ordered and Administered this Visit   Medications  ondansetron (ZOFRAN-ODT) disintegrating tablet 4 mg (4 mg Oral Given 07/22/16 1527)    BP (!) 153/72   Pulse 69   Temp 98.1 F (36.7 C)   Resp 18   SpO2 100%  No data  found.   Physical Exam  Constitutional: She appears well-developed and well-nourished.  HENT:  Head: Normocephalic and atraumatic.  Right Ear: External ear normal.  Left Ear: External ear normal.  Mouth/Throat: Oropharynx is clear and moist.  Eyes: Conjunctivae and EOM are normal. Pupils are equal, round, and reactive to light.  Neck: Normal range of motion.  Cardiovascular: Normal rate, regular rhythm and normal heart sounds.   Pulmonary/Chest: Effort normal.  Abdominal: Soft. Bowel sounds are normal. There is tenderness.  Generalized tenderness w/o guarding.  Nursing note and vitals reviewed.   Urgent Care Course     Procedures (including critical care  time)  Labs Review Labs Reviewed - No data to display  Imaging Review No results found.   Visual Acuity Review  Right Eye Distance:   Left Eye Distance:   Bilateral Distance:    Right Eye Near:   Left Eye Near:    Bilateral Near:         MDM   1. Constipation, unspecified constipation type   2. Nausea    Zofran ODT 4mg  po now Zofran ODT one po tid prn #20 Miralax 17 grams po bid #14  Push po fluids Eat diet high in fiber      Deatra Canter, [place] 07/22/16 1658

## 2016-07-22 NOTE — ED Triage Notes (Signed)
Pt here for lower abd cramping and nausea. sts she ate and it became much worse. Denies any diarrhea, sts hard BM and gas.

## 2016-07-24 ENCOUNTER — Telehealth: Payer: Self-pay | Admitting: Family Medicine

## 2016-07-24 NOTE — Telephone Encounter (Signed)
Went to urgent care on Tuesday with abdominal pain. Was told she was constipated. Was given miralax but there has been no results. The severe pain is gone but still is in pain.  She is concerned she hasnt had any results for the miralax. Please advise

## 2016-07-24 NOTE — Telephone Encounter (Signed)
Spoke with patient.  She has been taking the miralax 17g BID as instructed and increased her water intake.  She is no longer having "sharp shooting pains" but she is now having a dull aching sensation in her abdomen (she equates this to a 4/10)  She denies fever and her last BM was Monday, but it was "very hard and very little".    She is agreeable to an appt in the am but would like Dr. Jennette KettleNeal to let her know if she should cancel that appt.  Fleeger, Maryjo RochesterJessica Dawn, CMA

## 2016-07-25 ENCOUNTER — Ambulatory Visit (INDEPENDENT_AMBULATORY_CARE_PROVIDER_SITE_OTHER): Payer: Medicare Other | Admitting: Family Medicine

## 2016-07-25 ENCOUNTER — Encounter: Payer: Self-pay | Admitting: Family Medicine

## 2016-07-25 DIAGNOSIS — K5900 Constipation, unspecified: Secondary | ICD-10-CM | POA: Insufficient documentation

## 2016-07-25 MED ORDER — SENNOSIDES-DOCUSATE SODIUM 8.6-50 MG PO TABS: 1.0000 | ORAL_TABLET | Freq: Two times a day (BID) | ORAL | 0 refills | Status: DC

## 2016-07-25 MED ORDER — POLYETHYLENE GLYCOL 3350 17 G PO PACK: 17.0000 g | PACK | Freq: Three times a day (TID) | ORAL | 0 refills | Status: DC

## 2016-07-25 NOTE — Progress Notes (Signed)
   Subjective:   Vanessa Melton is a 83 y.o. female with a history of HTN, hypothyroidism, GERD, migraines, glaucoma here for same day appointment for  Chief Complaint  Patient presents with  . Constipation    [facility]     Patient reports constipation starting 4 days ago.  Last normal BM 5 days ago.  Constipation has not been a chronic issue for this patient. She was seen at the urgent care on 6/19 and prescribed Miralax BID which she has been taking. Last night and this morning she had very small bowel movements with a decreased caliber of stool. Her cramping has lessened, but she continues to be diffusely tender and have bloating. She denies any vomiting, food intolerance, decreased appetite, melena, hematochezia, fever, urinary issues. She has not tried any other medications.   Review of Systems:  Per HPI.   Social History: former smoker  Objective:  BP 138/76 (BP Location: Right Arm, Patient Position: Sitting, Cuff Size: Normal)   Pulse 72   Temp 98 F (36.7 C) (Oral)   Ht 5\' 1"  (1.549 m)   Wt 149 lb (67.6 kg)   SpO2 98%   BMI 28.15 kg/m   Gen:  83 y.o. female in NAD HEENT: NCAT, MMM, anicteric sclerae CV: RRR, no MRG Resp: Non-labored, CTAB, no wheezes noted Abd: Soft, Diffusely mildly tender, ND, BS present, no guarding or organomegaly Ext: WWP, no edema MSK: No obvious deformities, gait intact Neuro: Alert and oriented, speech normal      Assessment & Plan:     Vanessa Melton is a 83 y.o. female here for   Constipation We will increase Miralax to TID and start Sennokot-S BID for cleanout No S/S of obstruction Suspect change in stool caliber is due to hardened stools - f/u if unchanged after cleanout Return precautions discussed   Erasmo DownerBacigalupo, Angela M, MD MPH PGY-3,  [facility] 07/25/2016  11:59 AM

## 2016-07-25 NOTE — Patient Instructions (Signed)
Increase miralax to 3 times a day today and add Senokot-S twice daily. After everything is cleaned out, you can stop the Senokot S and decreased to once daily miralax.   Constipation, Adult Constipation is when a person has fewer bowel movements in a week than normal, has difficulty having a bowel movement, or has stools that are dry, hard, or larger than normal. Constipation may be caused by an underlying condition. It may become worse with age if a person takes certain medicines and does not take in enough fluids. Follow these instructions at home: Eating and drinking   Eat foods that have a lot of fiber, such as fresh fruits and vegetables, whole grains, and beans.  Limit foods that are high in fat, low in fiber, or overly processed, such as french fries, hamburgers, cookies, candies, and soda.  Drink enough fluid to keep your urine clear or pale yellow. General instructions  Exercise regularly or as told by your health care provider.  Go to the restroom when you have the urge to go. Do not hold it in.  Take over-the-counter and prescription medicines only as told by your health care provider. These include any fiber supplements.  Practice pelvic floor retraining exercises, such as deep breathing while relaxing the lower abdomen and pelvic floor relaxation during bowel movements.  Watch your condition for any changes.  Keep all follow-up visits as told by your health care provider. This is important. Contact a health care provider if:  You have pain that gets worse.  You have a fever.  You do not have a bowel movement after 4 days.  You vomit.  You are not hungry.  You lose weight.  You are bleeding from the anus.  You have thin, pencil-like stools. Get help right away if:  You have a fever and your symptoms suddenly get worse.  You leak stool or have blood in your stool.  Your abdomen is bloated.  You have severe pain in your abdomen.  You feel dizzy or you  faint. This information is not intended to replace advice given to you by your health care provider. Make sure you discuss any questions you have with your health care provider. Document Released: 10/19/2003 Document Revised: 08/10/2015 Document Reviewed: 07/11/2015 Elsevier Interactive Patient Education  2017 [employer]

## 2016-07-25 NOTE — Assessment & Plan Note (Signed)
We will increase Miralax to TID and start Sennokot-S BID for cleanout No S/S of obstruction Suspect change in stool caliber is due to hardened stools - f/u if unchanged after cleanout Return precautions discussed

## 2017-03-10 ENCOUNTER — Telehealth: Payer: Self-pay | Admitting: Family Medicine

## 2017-03-10 NOTE — Telephone Encounter (Signed)
Pt would like a recommendation for a dermatologist.  Please advise.

## 2017-03-12 NOTE — Telephone Encounter (Signed)
Spoke to pt. She tried Dr. Terri PiedraLupton but he is booked until April. Is there someone else you can refer her to?

## 2017-03-12 NOTE — Telephone Encounter (Signed)
Dear [name] Team I have sent several people to Dr Terri PiedraLupton. Vanessa LevySara Laronica Bhagat

## 2017-03-13 NOTE — Telephone Encounter (Signed)
Pt informed. Militza Devery T Vanessa Melton, CMA  

## 2017-03-13 NOTE — Telephone Encounter (Signed)
The only other one I know is Dr. Venancio PoissonLaura Lomax. She  Will probably find that all dermatologists are booked out several months. Denny LevySara Rylon Poitra

## 2017-04-01 ENCOUNTER — Encounter: Payer: Self-pay | Admitting: Family Medicine

## 2017-04-15 ENCOUNTER — Other Ambulatory Visit: Payer: Self-pay

## 2017-04-15 ENCOUNTER — Ambulatory Visit: Payer: Medicare Other | Admitting: Family Medicine

## 2017-04-15 ENCOUNTER — Encounter: Payer: Self-pay | Admitting: Family Medicine

## 2017-04-15 VITALS — BP 154/78 | HR 57 | Temp 97.8°F | Ht 61.0 in | Wt 150.8 lb

## 2017-04-15 DIAGNOSIS — L989 Disorder of the skin and subcutaneous tissue, unspecified: Secondary | ICD-10-CM

## 2017-04-15 DIAGNOSIS — M25511 Pain in right shoulder: Secondary | ICD-10-CM | POA: Diagnosis not present

## 2017-04-15 NOTE — Patient Instructions (Signed)
[phone] [facility]  OR [facility] a different number     New Patients [phone] (toll-free)  If you cannot get in to see someone soon. Let me know and I will see if I can find someone else

## 2017-04-16 NOTE — Progress Notes (Signed)
    CHIEF COMPLAINT / HPI: Wants me to look at a place on her left cheek.  Area has been there many many years but in the last few weeks to months it has started to itch and is red where previously it was normal skin tone.  No drainage.  No sweats, no fevers, no unusual weight change.  Energy level is normal.  #2.  Also having some right shoulder pain.  Worse with certain motions.  Bothers her when she is trying to work out.  She continues to work out 4-7 days a week at [employer].  REVIEW OF SYSTEMS: Denies any upper extremity weakness or numbness.  PERTINENT  PMH / PSH: I have reviewed the patient's medications, allergies, past medical and surgical history, smoking status and updated in the EMR as appropriate.   OBJECTIVE: GENERAL: Well-developed female no acute distress SHOULDER: Bilaterally symmetrical.  Right shoulder full range of motion and normal strength in all planes the rotator cuff however she has pain with abduction greater than 90 degrees and forward flexion greater than 110 degrees. VASCULAR: Radial pulses 2+ bilateral symmetrical SKIN: 8 x 13 mm slightly irregular but well circumcised erythematous area on the left cheek.  There are no unusual hyperpigmentation.  The borders are smooth.  I see no areas of unusual vascularity. Neck no adenopathy, no thyromegaly.  ASSESSMENT / PLAN: #1.  Likely benign lesion on the left cheek.  Looks LIKE  A LENTIGO  With this changing however I do think she should see dermatology and we worked to get her an appointment today.  She will let me know if she has problems getting that set up 2.  Shoulder pain.  I would like to have her see [facility] at the [facility] medicine center for we can do ultrasound to further evaluate this.

## 2017-04-24 ENCOUNTER — Encounter: Payer: Self-pay | Admitting: Family Medicine

## 2017-04-24 ENCOUNTER — Ambulatory Visit: Payer: Medicare Other | Admitting: Family Medicine

## 2017-04-24 ENCOUNTER — Ambulatory Visit: Payer: Self-pay

## 2017-04-24 VITALS — BP 132/86 | Ht 62.0 in | Wt 150.0 lb

## 2017-04-24 DIAGNOSIS — M25511 Pain in right shoulder: Principal | ICD-10-CM

## 2017-04-24 DIAGNOSIS — G8929 Other chronic pain: Secondary | ICD-10-CM | POA: Diagnosis not present

## 2017-04-24 MED ORDER — DICLOFENAC SODIUM 2 % TD SOLN: TRANSDERMAL | 1 refills | Status: DC

## 2017-04-24 NOTE — Progress Notes (Signed)
  Vanessa Melton - 84 y.o. female MRN [id number]  Date of birth: Dec 11, 1932    SUBJECTIVE:      Chief Complaint:/ HPI:  Right shoulder pain.  Gradually getting worse over the last 4-6 weeks.  Pain with reaching forward particularly if she is also rotating her palm upward.  Aching in nature.  4 out of 10.  Has been working out regularly.  Uses [employer] trainer which also uses arm motion and she has some increase in pain after that.  Otherwise her workout is essentially unchanged.   ROS:     No numbness or tingling in her arms or hands.  Is no weakness in the right upper extremity.  Occasionally has a sharp pain in the right shoulder but usually is aching as per HPI.  No unusual weight change.  No fever.  No skin lesions or rash on the right shoulder.  PERTINENT  PMH / PSH FH / / SH:  Past Medical, Surgical, Social, and Family History Reviewed & Updated in the EMR.  Pertinent findings include:  Has had side effects from ophthalmologic steroids and injectable steroids in the past causing weight gain, mood instability.  OBJECTIVE: BP 132/86   Ht 5\' 2"  (1.575 m)   Wt 150 lb (68 kg)   BMI 27.44 kg/m   Physical Exam:  Vital signs are reviewed. GENERAL: Well-developed well-nourished no acute distress SHOULDERS: Symmetrical.  Right shoulder has full range of motion and normal strength in all planes the rotator cuff.  Some pain with forward flexion and elevation particularly of the palm is supinated.  Mild tenderness palpation of the proximal bicep tendon.  Normal biceps strength and there is no defect noted the bicep muscle belly.   ELBOW: Isolated supination pronation at the elbow is painless and full range of motion. VASCULAR: Radial pulses 2+ bilaterally symmetrical.  Capillary refill normal bilaterally upper extremity SKIN: Right shoulder area is without any sign of rash, no lesions, no unusual warmth, no erythema.  ULTRASOUND: Shoulder: Right.  Biceps tendon is well located and there is no  sign of fluid around the tendon.  It is seen in this entirety.  Acromioclavicular joint has mild arthritis but no significant synovial hypertrophy, no effusion.  There are a few minor calcifications seen in the supraspinatus muscle and in the teres minor muscle but these are otherwise intact and normal in appearance.  There is slight impingement noted on dynamic testing. ASSESSMENT & PLAN:  See problem based charting & AVS for pt instructions.

## 2017-04-24 NOTE — Assessment & Plan Note (Signed)
Mild rotator cuff tendinitis with some probable underlying mild impingement.  At first I was going to do subacromial bursa injection but after discussion about her side effects with previous steroids including oral and ophthalmic, we decided to try topical diclofenac gel and rehab exercises for the next 2-4 weeks.  She will call me if things are getting worse.  If she is not improving after that time, will reconsider corticosteroid injection.  Ultimately her ophthalmologist was able to find a steroid  ointment that did not cause side effects so perhaps it would be beneficial figure out which one that was.

## 2017-04-28 ENCOUNTER — Other Ambulatory Visit: Payer: Self-pay | Admitting: Family Medicine

## 2017-05-13 ENCOUNTER — Ambulatory Visit (INDEPENDENT_AMBULATORY_CARE_PROVIDER_SITE_OTHER): Payer: Medicare Other | Admitting: Family Medicine

## 2017-05-13 ENCOUNTER — Encounter: Payer: Self-pay | Admitting: Family Medicine

## 2017-05-13 ENCOUNTER — Other Ambulatory Visit: Payer: Self-pay

## 2017-05-13 VITALS — BP 154/82 | HR 68 | Temp 98.2°F | Ht 62.0 in | Wt 149.0 lb

## 2017-05-13 DIAGNOSIS — G8929 Other chronic pain: Secondary | ICD-10-CM | POA: Diagnosis not present

## 2017-05-13 DIAGNOSIS — M25511 Pain in right shoulder: Secondary | ICD-10-CM | POA: Diagnosis not present

## 2017-05-13 DIAGNOSIS — I10 Essential (primary) hypertension: Secondary | ICD-10-CM

## 2017-05-13 DIAGNOSIS — G43109 Migraine with aura, not intractable, without status migrainosus: Secondary | ICD-10-CM

## 2017-05-13 MED ORDER — CANDESARTAN CILEXETIL-HCTZ 32-25 MG PO TABS
ORAL_TABLET | ORAL | 3 refills | Status: DC
Start: 2017-05-13 — End: 2017-08-04

## 2017-05-13 MED ORDER — LEVOTHYROXINE SODIUM 75 MCG PO TABS: 75.0000 ug | ORAL_TABLET | Freq: Every day | ORAL | 3 refills | Status: DC

## 2017-05-13 MED ORDER — RIZATRIPTAN BENZOATE 10 MG PO TABS: 10.0000 mg | ORAL_TABLET | ORAL | 3 refills | Status: AC | PRN

## 2017-05-13 NOTE — Patient Instructions (Signed)
I am changing your blood pressure medicine Please come back for a recheck in 4-6 weeks and we will do some blood work then as well

## 2017-05-13 NOTE — Assessment & Plan Note (Signed)
Refilled her medicine.  Discussed red flags such as he suddenly had

## 2017-05-13 NOTE — Assessment & Plan Note (Signed)
Significantly improved with glenohumeral joint injection and topical application of diclofenac gel.

## 2017-05-13 NOTE — Progress Notes (Signed)
    CHIEF COMPLAINT / HPI: 1.  Follow-up shoulder pain: After last office visit where we did a glenohumeral joint injection she has had 80% improvement in her pain. 2.  Has had migraines since she was a child.  They are very intermittent.  Had been using some medication that she last refilled in 2014 out of it.  She would like a refill.  Her migraines tend to cluster 3 or 4/month and then none for many months or years.  They will start with aura.  She can almost always trace trigger back to chocolate, stress, weather change or sleep disruption. 3.  Evaluate dermatologist for this skin lesion on her face.  Continues to itch but has not changed since I saw her last. 4.  Her ophthalmologist is seeing her for potential problems with retinal stability.  He seems  to think she may have possibility of a retinal tear.  She has an appointment today and is somewhat nervous.  REVIEW OF SYSTEMS: No fever, no unusual weight change.  Shoulder pain is significantly better.  No abdominal pain.  No change in bowel habits.  Headaches as per HPI  PERTINENT  PMH / PSH: I have reviewed the patient's medications, allergies, past medical and surgical history, smoking status and updated in the EMR as appropriate.   OBJECTIVE:  Vital signs reviewed. GENERAL: Well-developed, well-nourished, no acute distress. CARDIOVASCULAR: Regular rate and rhythm no murmur gallop or rub LUNGS: Clear to auscultation bilaterally, no rales or wheeze. ABDOMEN: Soft positive bowel sounds NEURO: No gross focal neurological deficits. MSK: Movement of extremity x 4.  Right shoulder has full range of motion in all planes the rotator cuff with intact strength.  She has very slight amount of pain with abduction above 140 degrees but has significantly improved. PSYCHIATRIC: Alert and oriented x4.  Affect is interactive.  Speech is normal fluency and content.  Skin: Left cheek reveals a well-circumscribed flat slightly hyperpigmented lesion  essentially unchanged from last office visit.    ASSESSMENT / PLAN: Please see problem oriented charting for details

## 2017-05-13 NOTE — Assessment & Plan Note (Signed)
I reviewed her last blood pressure readings over the last 6 months.  She said several that are borderline high.  I will change her to candesartan HCTZ and see her in 1 month.  At that point we will check creatinine and electrolytes.

## 2017-06-10 ENCOUNTER — Ambulatory Visit: Payer: Medicare Other | Admitting: Family Medicine

## 2017-06-24 ENCOUNTER — Other Ambulatory Visit: Payer: Self-pay | Admitting: Family Medicine

## 2017-07-08 ENCOUNTER — Ambulatory Visit: Payer: Medicare Other | Admitting: Family Medicine

## 2017-07-22 ENCOUNTER — Ambulatory Visit: Payer: Medicare Other | Admitting: Family Medicine

## 2017-07-29 ENCOUNTER — Ambulatory Visit (INDEPENDENT_AMBULATORY_CARE_PROVIDER_SITE_OTHER): Payer: Medicare Other | Admitting: Family Medicine

## 2017-07-29 ENCOUNTER — Other Ambulatory Visit: Payer: Self-pay

## 2017-07-29 ENCOUNTER — Encounter: Payer: Self-pay | Admitting: Family Medicine

## 2017-07-29 VITALS — BP 126/74 | HR 57 | Temp 98.3°F | Ht 62.0 in | Wt 144.2 lb

## 2017-07-29 DIAGNOSIS — E034 Atrophy of thyroid (acquired): Secondary | ICD-10-CM | POA: Diagnosis not present

## 2017-07-29 DIAGNOSIS — I10 Essential (primary) hypertension: Secondary | ICD-10-CM | POA: Diagnosis not present

## 2017-07-29 DIAGNOSIS — R0609 Other forms of dyspnea: Secondary | ICD-10-CM | POA: Diagnosis not present

## 2017-07-30 ENCOUNTER — Encounter: Payer: Self-pay | Admitting: Family Medicine

## 2017-07-30 LAB — CBC
Hematocrit: 35.6 % (ref 34.0–46.6)
Hemoglobin: 12.1 g/dL (ref 11.1–15.9)
MCH: 29.3 pg (ref 26.6–33.0)
MCHC: 34 g/dL (ref 31.5–35.7)
MCV: 86 fL (ref 79–97)
PLATELETS: 201 10*3/uL (ref 150–450)
RBC: 4.13 x10E6/uL (ref 3.77–5.28)
RDW: 14.6 % (ref 12.3–15.4)
WBC: 6.1 10*3/uL (ref 3.4–10.8)

## 2017-07-30 LAB — CMP14+EGFR
ALK PHOS: 58 IU/L (ref 39–117)
ALT: 17 IU/L (ref 0–32)
AST: 21 IU/L (ref 0–40)
Albumin/Globulin Ratio: 2.2 (ref 1.2–2.2)
Albumin: 4.3 g/dL (ref 3.5–4.7)
BUN/Creatinine Ratio: 19 (ref 12–28)
BUN: 17 mg/dL (ref 8–27)
Bilirubin Total: 0.5 mg/dL (ref 0.0–1.2)
CO2: 25 mmol/L (ref 20–29)
CREATININE: 0.9 mg/dL (ref 0.57–1.00)
Calcium: 10 mg/dL (ref 8.7–10.3)
Chloride: 98 mmol/L (ref 96–106)
GFR calc Af Amer: 68 mL/min/{1.73_m2} (ref >59)
GFR calc non Af Amer: 59 mL/min/{1.73_m2} — ABNORMAL LOW (ref >59)
GLUCOSE: 92 mg/dL (ref 65–99)
Globulin, Total: 2 g/dL (ref 1.5–4.5)
Potassium: 4.6 mmol/L (ref 3.5–5.2)
SODIUM: 136 mmol/L (ref 134–144)
Total Protein: 6.3 g/dL (ref 6.0–8.5)

## 2017-07-30 LAB — TSH: TSH: 0.288 u[IU]/mL — ABNORMAL LOW (ref 0.450–4.500)

## 2017-07-30 NOTE — Progress Notes (Signed)
    CHIEF COMPLAINT / HPI:  1. F/u of med changes for BO. Shenotes her BO readings are much better. 2. Has had significant onset of dyspnea on exertion. This seemed to start when we changed her BO med. She also had a trip to [place] (where she goes 2 x per year) and felt the altitude really bothered her. She was puzzled especially as her partner had similar issues and his BP was elevated the whole time he was there. She says neither have returned to baseline. 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 Felt hot. 3. Only new "medicine" was some CBD oil she has tried for her shoulder pain--seemed to help. Wants to know if she can get a rx for that. She [place]. She has been using this a couple of times a day. She reports the above mentioned DOE stated before she initiated the CBD oil. 4. No vision issues but her ophthalmologist fund a raised nevus on her retina and she to [facility].  REVIEW OF SYSTEMS: See HPI additional pertinent ROS negative for chest pain, no fluttering or irregularity of her heart, no pali[pitations. No SOB or dyspnea at rest or during night. No weight change. No fever. No lower extremity edema. No cough, no headache, no nausea or vomiting, no sweats, no constipation.  PERTINENT  PMH / PSH: I have reviewed the patient's medications, allergies, past medical and surgical history, smoking status and updated in the EMR as appropriate. 2014 carotid dopplers normal  OBJECTIVE:  Vital signs reviewed GENERALl: Well developed, well nourished, in no acute distress. HEENT: EOMI, sclerae are nonicteric NECK: Supple, FROM, without lymphadenopathy.  THYROID: normal without nodularity CAROTID ARTERIES: without bruits LUNGS: clear to auscultation bilaterally. No  wheezes or rales. Normal respiratory effort HEART: Regular rate and rhythm, no murmurs. Distal pulses are bilaterally symmetrical, 2+. ABDOMEN: soft with positive bowel sounds. No masses noted MSK: MOE x 4. Normal muscle strength, bulk and tone. SKIN no rash. Normal temperature. NEURO: no focal deficits. Normal gait. Normal balance.   ASSESSMENT / PLAN: DOE: Her pulse is absolutely regular and about 60 BPM. We did not do EKG today but if this conitnues and labs show nothing, she will likely need EKG, possibly ECHO, possibly cardiology consult. Will check CBC, CMP, TSH and panel. She reports that it started before initiating CBD oil, but there was also a period of time she was at  Elevated altitude---not sure how tat plays in. PE is always on the differential but seems very unlikely as she has no calf pain or other symptoms.  Most likely culprit is the change in BP medication. Perhaps she is dropping too low during exercise. Will d/c her BO med totally 3-5 days and see how her symptoms are. She will notify me if any new or worsening sx. Willl let me know Monday how her DOE is.

## 2017-07-31 ENCOUNTER — Encounter: Payer: Self-pay | Admitting: Family Medicine

## 2017-08-03 ENCOUNTER — Telehealth: Payer: Self-pay | Admitting: *Deleted

## 2017-08-03 NOTE — Telephone Encounter (Signed)
Patient LM on nurse line.  She wanted to let Dr. Jennette KettleNeal know that her symptoms are gone.    Also [facility] and [facility] her BP.  It was 122/64 @ 3:30pm.

## 2017-08-04 NOTE — Addendum Note (Signed)
Addended byDenny Levy: Betta Balla L on: 08/04/2017 05:05 PM   Modules accepted: Orders

## 2017-08-04 NOTE — Telephone Encounter (Addendum)
[phone] (H) continue OFF the BP medicine. ALL off her SOB symptoms have resolved. BP today was 124/79. She will take readings for next week or so and send them to me---will call if return of sx. Denny LevySara Azion Centrella'

## 2017-08-13 ENCOUNTER — Encounter: Payer: Self-pay | Admitting: Family Medicine

## 2017-08-13 ENCOUNTER — Other Ambulatory Visit: Payer: Self-pay | Admitting: Family Medicine

## 2017-08-13 MED ORDER — AMLODIPINE BESYLATE 2.5 MG PO TABS: 2.5000 mg | ORAL_TABLET | Freq: Every day | ORAL | 3 refills | Status: DC

## 2017-08-13 NOTE — Progress Notes (Signed)
See MyChart notes.

## 2017-11-06 ENCOUNTER — Other Ambulatory Visit: Payer: Self-pay | Admitting: Family Medicine

## 2017-11-30 ENCOUNTER — Other Ambulatory Visit: Payer: Self-pay

## 2017-11-30 ENCOUNTER — Ambulatory Visit (INDEPENDENT_AMBULATORY_CARE_PROVIDER_SITE_OTHER): Payer: Medicare Other

## 2017-11-30 VITALS — BP 140/70 | HR 57 | Temp 97.7°F | Ht 62.0 in | Wt 146.0 lb

## 2017-11-30 DIAGNOSIS — Z0000 Encounter for general adult medical examination without abnormal findings: Secondary | ICD-10-CM

## 2017-11-30 MED ORDER — SUCRALFATE 1 GM/10ML PO SUSP: ORAL | 3 refills | Status: DC

## 2017-11-30 NOTE — Telephone Encounter (Signed)
Patient in for AWV. Thought she was seeing PCP today. Endorses left sided back and leg pain x 2 months. Ok with sitting, increased pain to a level of 8 when standing. Has been rubbing a CBD oil cream with THC on area. This gives her about 4-5 hours of pain relief.  Made appt for 12/09/17 but requests a work-in with PCP for 10/30 if possible.  Also needs new Rx for Carafate.  Ples Specter, RN [facility] RN)

## 2017-11-30 NOTE — Patient Instructions (Addendum)
Vanessa Melton , Thank you for taking time to come for your Medicare Wellness Visit. I appreciate your ongoing commitment to your health goals. Please review the following plan we discussed and let me know if I can assist you in the future.   These are the goals we discussed: Goals    . Patient Stated     Decreased back pain     . Patient Stated     Improvement in eyes       This is a list of the screening recommended for you and due dates:  Health Maintenance  Topic Date Due  . Flu Shot  06/03/2018*  . Colon Cancer Screening  03/30/2019  . Tetanus Vaccine  04/27/2024  . DEXA scan (bone density measurement)  Completed  *Topic was postponed. The date shown is not the original due date.    Fall Prevention in the Home Falls can cause injuries. They can happen to people of all ages. There are many things you can do to make your home safe and to help prevent falls. What can I do on the outside of my home?  Regularly fix the edges of walkways and driveways and fix any cracks.  Remove anything that might make you trip as you walk through a door, such as a raised step or threshold.  Trim any bushes or trees on the path to your home.  Use bright outdoor lighting.  Clear any walking paths of anything that might make someone trip, such as rocks or tools.  Regularly check to see if handrails are loose or broken. Make sure that both sides of any steps have handrails.  Any raised decks and porches should have guardrails on the edges.  Have any leaves, snow, or ice cleared regularly.  Use sand or salt on walking paths during winter.  Clean up any spills in your garage right away. This includes oil or grease spills. What can I do in the bathroom?  Use night lights.  Install grab bars by the toilet and in the tub and shower. Do not use towel bars as grab bars.  Use non-skid mats or decals in the tub or shower.  If you need to sit down in the shower, use a plastic, non-slip  stool.  Keep the floor dry. Clean up any water that spills on the floor as soon as it happens.  Remove soap buildup in the tub or shower regularly.  Attach bath mats securely with double-sided non-slip rug tape.  Do not have throw rugs and other things on the floor that can make you trip. What can I do in the bedroom?  Use night lights.  Make sure that you have a light by your bed that is easy to reach.  Do not use any sheets or blankets that are too big for your bed. They should not hang down onto the floor.  Have a firm chair that has side arms. You can use this for support while you get dressed.  Do not have throw rugs and other things on the floor that can make you trip. What can I do in the kitchen?  Clean up any spills right away.  Avoid walking on wet floors.  Keep items that you use a lot in easy-to-reach places.  If you need to reach something above you, use a strong step stool that has a grab bar.  Keep electrical cords out of the way.  Do not use floor polish or wax that makes floors slippery.  If you must use wax, use non-skid floor wax.  Do not have throw rugs and other things on the floor that can make you trip. What can I do with my stairs?  Do not leave any items on the stairs.  Make sure that there are handrails on both sides of the stairs and use them. Fix handrails that are broken or loose. Make sure that handrails are as long as the stairways.  Check any carpeting to make sure that it is firmly attached to the stairs. Fix any carpet that is loose or worn.  Avoid having throw rugs at the top or bottom of the stairs. If you do have throw rugs, attach them to the floor with carpet tape.  Make sure that you have a light switch at the top of the stairs and the bottom of the stairs. If you do not have them, ask someone to add them for you. What else can I do to help prevent falls?  Wear shoes that: ? Do not have high heels. ? Have rubber bottoms. ? Are  comfortable and fit you well. ? Are closed at the toe. Do not wear sandals.  If you use a stepladder: ? Make sure that it is fully opened. Do not climb a closed stepladder. ? Make sure that both sides of the stepladder are locked into place. ? Ask someone to hold it for you, if possible.  Clearly mark and make sure that you can see: ? Any grab bars or handrails. ? First and last steps. ? Where the edge of each step is.  Use tools that help you move around (mobility aids) if they are needed. These include: ? Canes. ? Walkers. ? Scooters. ? Crutches.  Turn on the lights when you go into a dark area. Replace any light bulbs as soon as they burn out.  Set up your furniture so you have a clear path. Avoid moving your furniture around.  If any of your floors are uneven, fix them.  If there are any pets around you, be aware of where they are.  Review your medicines with your doctor. Some medicines can make you feel dizzy. This can increase your chance of falling. Ask your doctor what other things that you can do to help prevent falls. This information is not intended to replace advice given to you by your health care provider. Make sure you discuss any questions you have with your health care provider. Document Released: 11/16/2008 Document Revised: 06/28/2015 Document Reviewed: 02/24/2014 Elsevier Interactive Patient Education  [employer].

## 2017-11-30 NOTE — Progress Notes (Signed)
I have reviewed her chart including PMH, PSH, Social Hx. I have reviewed the updated health maintenance issues. I agree with asssessment and plan procvded by Ms. Brake.

## 2017-11-30 NOTE — Progress Notes (Signed)
Subjective:   Vanessa Melton is a 84 y.o. female who presents for Medicare Annual (Subsequent) preventive examination. The patient was informed that the wellness visit is to identify future health risk and educate and initiate measures that can reduce risk for increased disease through the lifespan.   Review of Systems:  Physical assessment deferred to PCP.  Cardiac Risk Factors include: advanced age (>52men, >57 women);hypertension    Objective:    Vitals: BP 140/70   Pulse (!) 57   Temp 97.7 F (36.5 C) (Oral)   Ht 5\' 2"  (1.575 m)   Wt 146 lb (66.2 kg)   SpO2 98%   BMI 26.70 kg/m   Body mass index is 26.7 kg/m.  Advanced Directives 11/30/2017 07/25/2016 05/21/2016 04/25/2015 03/14/2015  Does Patient Have a Medical Advance Directive? Yes No Yes Yes Yes  Type of Estate agent of [employer];Living will - Healthcare Power of [place];Living will - Healthcare Power of [place];Living will  Does patient want to make changes to medical advance directive? No - Patient declined - - - -  Copy of Healthcare Power of Attorney in Chart? No - copy requested - No - copy requested No - copy requested No - copy requested  Would patient like information on creating a medical advance directive? - No - Patient declined - - -    Tobacco Social History   Tobacco Use  Smoking Status Former Smoker  . Years: 5.00  . Types: Cigarettes  . Last attempt to quit: 02/03/1957  . Years since quitting: 60.8  Smokeless Tobacco Never Used  Tobacco Comment   no plan to start again     Counseling given: Not Answered Comment: no plan to start again   Clinical Intake:  Pre-visit preparation completed: No  Pain : 0-10 Pain Score: 8 (when walking; better when sitting) Pain Type: Acute pain Pain Location: Back Pain Orientation: Left Pain Onset: More than a month ago Pain Relieving Factors: CBD oil with THC; CBD dropper also; works for 4-5 hours ; helps her not to take Tylenol and  BC powder  Pain Relieving Factors: CBD oil with THC; CBD dropper also; works for 4-5 hours ; helps her not to take Tylenol and BC powder  Nutritional Status: BMI 25 -29 Overweight Diabetes: No  How often do you need to have someone help you when you read instructions, pamphlets, or other written materials from your doctor or pharmacy?: 1 - Never What is the last grade level you completed in school?: Doctorate   Interpreter Needed?: No    Past Medical History:  Diagnosis Date  . Allergy   . Anxiety   . Arthritis   . Cataract   . Hypertension    Past Surgical History:  Procedure Laterality Date  . CATARACT EXTRACTION W/ INTRAOCULAR LENS IMPLANT    . COSMETIC SURGERY    . EYE MUSCLE SURGERY    . EYE SURGERY    . FRACTURE SURGERY     Family History  Problem Relation Age of Onset  . Colon cancer Mother   . Emphysema Father   . Aneurysm Brother   . Stroke Brother   . Colon polyps Daughter   . Colon cancer Brother    Social History   Socioeconomic History  . Marital status: Significant Other    Spouse name: Not on file  . Number of children: 3  . Years of education: 29  . Highest education level: Doctorate  Occupational History  . Occupation:  retired professor    Comment: [employer]  Social Needs  . Financial resource strain: Not hard at all  . Food insecurity:    Worry: Never true    Inability: Never true  . Transportation needs:    Medical: No    Non-medical: No  Tobacco Use  . Smoking status: Former Smoker    Years: 5.00    Types: Cigarettes    Last attempt to quit: 02/03/1957    Years since quitting: 60.8  . Smokeless tobacco: Never Used  . Tobacco comment: no plan to start again  Substance and Sexual Activity  . Alcohol use: Yes    Alcohol/week: 7.0 standard drinks    Types: 7 Glasses of wine per week    Comment: social  . Drug use: No  . Sexual activity: Yes    Birth control/protection: None  Lifestyle  . Physical activity:    Days per  week: 5 days    Minutes per session: 90 min  . Stress: Not at all  Relationships  . Social connections:    Talks on phone: More than three times a week    Gets together: More than three times a week    Attends religious service: More than 4 times per year    Active member of club or organization: Yes    Attends meetings of clubs or organizations: More than 4 times per year    Relationship status: Living with partner  Other Topics Concern  . Not on file  Social History Narrative   Significant other: Dr, [name]   Home is split level, 6 stairs. Handrails on stairs, has smoke alarms and fire extinguisher.   Exercises 5+ days a week 1-1.5 hours per day   Golfs   Shows horses--Arabian female named BOGO (after buying the mare she got him free--buy one get one=BOGO)   All [address] [place]   Has an adopted Medical laboratory scientific officer.   Travels frequently   Wears seat belts in vehicles.      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 ALPRAZolam (XANAX) 0.5 MG tablet Take one by mouth one hour prior to public speaking prn  . amLODipine (NORVASC) 2.5 MG tablet Take 1 tablet (2.5 mg total) by mouth daily.  Marland Kitchen CARAFATE 1 GM/10ML suspension TAKE 10 MLS BY MOUTH FOUR TIMES DAILY WITH MEALS AND AT BEDTIME  . levothyroxine (SYNTHROID, LEVOTHROID) 75 MCG tablet Take 1 tablet (75 mcg total) by mouth daily.  . rizatriptan (MAXALT) 10 MG tablet Take 1 tablet (10 mg total) by mouth as needed for migraine. May repeat in 2 hours if needed  . diclofenac sodium (VOLTAREN) 1 % GEL APPLY SMALL AMOUNT TO RIGHT SHOULDER TID  . diclofenac sodium (VOLTAREN) 1 % GEL APPLY SMALL AMOUNT TO RIGHT SHOULDER THREE TIMES DAILY (Patient not taking: Reported on 11/30/2017)  . polyethylene glycol (GAVILYTE-G) 236 g solution    No facility-administered encounter medications on file as of 11/30/2017.     Activities of Daily Living In your present state  of health, do you have any difficulty performing the following activities: 11/30/2017  Hearing? N  Vision? Y  Comment recent eye surgery  Difficulty concentrating or making decisions? N  Walking or climbing stairs? N  Dressing or bathing? N  Doing errands, shopping? N  Preparing Food and eating ? N  Using the Toilet? N  In the past six months,  have you accidently leaked urine? N  Do you have problems with loss of bowel control? N  Managing your Medications? N  Managing your Finances? N  Housekeeping or managing your Housekeeping? N  Some recent data might be hidden    Patient Care Team: Nestor Ramp, MD as PCP - General (Family Medicine) Nelson Chimes, MD as Consulting Physician (Ophthalmology) Materin, Huey Romans, MD as Referring Physician (Ophthalmology) Saralyn Pilar, MD as Referring Physician (Ophthalmology) Carmela Rima, MD as Consulting Physician (Ophthalmology)    Assessment:   This is a routine wellness examination for Carolynn Comment.  Exercise Activities and Dietary recommendations Current Exercise Habits: Structured exercise class, Type of exercise: strength training/weights;treadmill;walking;stretching, Time (Minutes): 60, Frequency (Times/Week): 5, Weekly Exercise (Minutes/Week): 300, Intensity: Moderate  Goals    . Patient Stated     Decreased back pain     . Patient Stated     Improvement in eyes       Fall Risk Fall Risk  11/30/2017 07/29/2017 05/13/2017 05/21/2016 04/25/2015  Falls in the past year? No No No No No   Is the patient's home free of loose throw rugs in walkways, pet beds, electrical cords, etc?   yes      Grab bars in the bathroom? no      Handrails on the stairs?   yes      Adequate lighting?   yes  Depression Screen PHQ 2/9 Scores 11/30/2017 07/29/2017 05/13/2017 04/15/2017  PHQ - 2 Score 0 0 0 0     Cognitive Function MMSE - Mini Mental State Exam 11/30/2017  Orientation to time 5  Orientation to Place 5  Registration 3    Attention/ Calculation 5  Recall 3  Language- name 2 objects 2  Language- repeat 1  Language- follow 3 step command 3  Language- read & follow direction 1  Write a sentence 1  Copy design 1  Total score 30     6CIT Screen 11/30/2017  What Year? 0 points  What month? 0 points  What time? 0 points  Count back from 20 0 points  Months in reverse 0 points  Repeat phrase 0 points  Total Score 0    Immunization History  Administered Date(s) Administered  . Pneumococcal Conjugate-13 05/21/2016  . Td 02/04/2004, 04/28/2014  . Zoster 04/03/2012     Screening Tests Health Maintenance  Topic Date Due  . INFLUENZA VACCINE  06/03/2018 (Originally 09/03/2017)  . COLONOSCOPY  03/30/2019  . TETANUS/TDAP  04/27/2024  . DEXA SCAN  Completed    Cancer Screenings: Lung: Low Dose CT Chest recommended if Age 29-80 years, 30 pack-year currently smoking OR have quit w/in 15years. Patient does not qualify. Breast:  Up to date on Mammogram? Yes   Up to date of Bone Density/Dexa? Yes Colorectal: up to date   Additional Screenings: :     Plan:  F/U with PCP scheduled for 12/09/17. Patient declined flu vaccine today. Will send note and refill request to PCP under a separate encounter.   I have personally reviewed and noted the following in the patient's chart:   . Medical and social history . Use of alcohol, tobacco or illicit drugs  . Current medications and supplements . Functional ability and status . Nutritional status . Physical activity . Advanced directives . List of other physicians . Hospitalizations, surgeries, and ER visits in previous 12 months . Vitals . Screenings to include cognitive, depression, and falls . Referrals and appointments  In addition, I  have reviewed and discussed with patient certain preventive protocols, quality metrics, and best practice recommendations. A written personalized care plan for preventive services as well as general preventive health  recommendations were provided to patient.     Nigel Mormon, RN  11/30/2017

## 2017-12-08 ENCOUNTER — Telehealth: Payer: Self-pay | Admitting: Family Medicine

## 2017-12-08 NOTE — Telephone Encounter (Signed)
Spoke with pt reminding them of/confirming their appt for tomorrow 12/09/2017. -CH °

## 2017-12-09 ENCOUNTER — Ambulatory Visit (INDEPENDENT_AMBULATORY_CARE_PROVIDER_SITE_OTHER): Payer: Medicare Other | Admitting: Family Medicine

## 2017-12-09 ENCOUNTER — Encounter: Payer: Self-pay | Admitting: Family Medicine

## 2017-12-09 ENCOUNTER — Other Ambulatory Visit: Payer: Self-pay

## 2017-12-09 VITALS — BP 134/72 | HR 61 | Temp 98.1°F | Ht 62.0 in | Wt 148.6 lb

## 2017-12-09 DIAGNOSIS — Z23 Encounter for immunization: Secondary | ICD-10-CM

## 2017-12-09 DIAGNOSIS — M545 Low back pain, unspecified: Secondary | ICD-10-CM
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
## 2018-01-07 ENCOUNTER — Other Ambulatory Visit: Payer: Self-pay

## 2018-01-07 ENCOUNTER — Ambulatory Visit: Payer: Medicare Other | Admitting: Family Medicine

## 2018-01-07 ENCOUNTER — Ambulatory Visit (INDEPENDENT_AMBULATORY_CARE_PROVIDER_SITE_OTHER)
Admission: RE | Admit: 2018-01-07 | Discharge: 2018-01-07 | Disposition: A | Discharge status: Home / Self Care | Payer: Medicare Other | Source: Ambulatory Visit | Attending: Family Medicine | Admitting: Family Medicine

## 2018-01-07 ENCOUNTER — Ambulatory Visit: Payer: Self-pay

## 2018-01-07 ENCOUNTER — Encounter: Payer: Self-pay | Admitting: Family Medicine

## 2018-01-07 VITALS — BP 126/82 | HR 69 | Ht 62.0 in | Wt 149.0 lb

## 2018-01-07 DIAGNOSIS — M25531 Pain in right wrist: Secondary | ICD-10-CM

## 2018-01-07 DIAGNOSIS — S6991XA Unspecified injury of right wrist, hand and finger(s), initial encounter: Secondary | ICD-10-CM | POA: Diagnosis not present

## 2018-01-07 HISTORY — DX: Unspecified injury of right wrist, hand and finger(s), initial encounter: S69.91XA

## 2018-01-07 MED ORDER — VITAMIN D (ERGOCALCIFEROL) 1.25 MG (50000 UNIT) PO CAPS: 50000.0000 [IU] | ORAL_CAPSULE | ORAL | 0 refills | Status: DC

## 2018-01-07 NOTE — Patient Instructions (Addendum)
Good to see you  Ice 20 minutes 2 times daily. Usually after activity and before bed. Wear brace day and night for 2 weeks  Can take it off for shwer only  Once weekly vitamin D for 12 weeks I would consider a new bone density as well and either ask Dr. Jennette KettleNeal or you can schedule it up front  See me again in 2-3 weeks (ok to double book)

## 2018-01-07 NOTE — Progress Notes (Signed)
Vanessa Melton D.O. [facility] [address] [place], [place] [address] Phone: [phone] Subjective:    I Vanessa NighKana Melton am serving as a Neurosurgeonscribe for Dr. Antoine PrimasZachary Lavonne Kinderman.   CC: Right wrist pain  BJY:NWGNFAOZHYHPI:Subjective  Vanessa Melton is a 84 y.o. female coming in with complaint of right wrist pain. FOOSH. Swollen. Was walking her dogs when she fell. Wrist is weak. Numbness in the hand.   Onset- Yesterday Location- Wrist joint Duration-  Character- sharp with movement, achy without movement Aggravating factors- movement Reliving factors-  Therapies tried- Ice, CBD oil Severity-8 out of 10  X-rays are taken today.  X-rays independently visualized by me showing moderate osteoarthritic changes as well as patient having osteoporosis.  No true acute fracture noted     Past Medical History:  Diagnosis Date  . Allergy   . Anxiety   . Arthritis   . Cataract   . Hypertension    Past Surgical History:  Procedure Laterality Date  . CATARACT EXTRACTION W/ INTRAOCULAR LENS IMPLANT    . COSMETIC SURGERY    . EYE MUSCLE SURGERY    . EYE SURGERY    . FRACTURE SURGERY     Social History   Socioeconomic History  . Marital status: Significant Other    Spouse name: Not on file  . Number of children: 3  . Years of education: 3521  . Highest education level: Doctorate  Occupational History  . Occupation: retired professor    Comment: [employer]  Social Needs  . Financial resource strain: Not hard at all  . Food insecurity:    Worry: Never true    Inability: Never true  . Transportation needs:    Medical: No    Non-medical: No  Tobacco Use  . Smoking status: Former Smoker    Years: 5.00    Types: Cigarettes    Last attempt to quit: 02/03/1957    Years since quitting: 60.9  . Smokeless tobacco: Never Used  . Tobacco comment: no plan to start again  Substance and Sexual Activity  . Alcohol use: Yes    Alcohol/week: 7.0 standard drinks    Types: 7 Glasses of wine  per week    Comment: social  . Drug use: No  . Sexual activity: Yes    Birth control/protection: None  Lifestyle  . Physical activity:    Days per week: 5 days    Minutes per session: 90 min  . Stress: Not at all  Relationships  . Social connections:    Talks on phone: More than three times a week    Gets together: More than three times a week    Attends religious service: More than 4 times per year    Active member of club or organization: Yes    Attends meetings of clubs or organizations: More than 4 times per year    Relationship status: Living with partner  Other Topics Concern  . Not on file  Social History Narrative   Significant other: Dr, Katherine Roanavid Boger   Home is split level, 6 stairs. Handrails on stairs, has smoke alarms and fire extinguisher.   Exercises 5+ days a week 1-1.5 hours per day   Golfs   Shows horses--Arabian female named BOGO (after buying the mare she got him free--buy one get one=BOGO)   [employer]   Has an adopted Medical laboratory scientific officercat.   Travels frequently   Wears seat belts in vehicles.  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 Beta Adrenergic Blockers Shortness Of Breath and Other (See Comments)    Chest pain, dizziness  . Candesartan Cilexetil-Hctz     Dyspnea on exertion  . Ace Inhibitors Cough  . Codeine     Headache,SOB, rash  . Combigan [Brimonidine Tartrate-Timolol]     Chest pain  . Doxazosin   . Lisinopril Cough  . Penicillins     Headache, sob, rash   Family History  Problem Relation Age of Onset  . Colon cancer Mother   . Emphysema Father   . Aneurysm Brother   . Stroke Brother   . Colon polyps Daughter   . Colon cancer Brother     Current Outpatient Medications (Endocrine & Metabolic):  .  levothyroxine (SYNTHROID, LEVOTHROID) 75 MCG tablet, Take 1 tablet (75 mcg total) by mouth daily.  Current Outpatient Medications (Cardiovascular):  .  amLODipine (NORVASC) 2.5 MG  tablet, Take 1 tablet (2.5 mg total) by mouth daily.   Current Outpatient Medications (Analgesics):  .  rizatriptan (MAXALT) 10 MG tablet, Take 1 tablet (10 mg total) by mouth as needed for migraine. May repeat in 2 hours if needed   Current Outpatient Medications (Other):  Marland Kitchen  ALPRAZolam (XANAX) 0.5 MG tablet, Take one by mouth one hour prior to public speaking prn (Patient not taking: Reported on 01/07/2018) .  polyethylene glycol (GAVILYTE-G) 236 g solution,  .  sucralfate (CARAFATE) 1 GM/10ML suspension, TAKE 10 MLS BY MOUTH FOUR TIMES DAILY WITH MEALS AND AT BEDTIME (Patient not taking: Reported on 01/07/2018) .  Vitamin D, Ergocalciferol, (DRISDOL) 1.25 MG (50000 UT) CAPS capsule, Take 1 capsule (50,000 Units total) by mouth every 7 (seven) days.    Past medical history, social, surgical and family history all reviewed in electronic medical record.  No pertanent information unless stated regarding to the chief complaint.   Review of Systems:  No headache, visual changes, nausea, vomiting, diarrhea, constipation, dizziness, abdominal pain, skin rash, fevers, chills, night sweats, weight loss, swollen lymph nodes, body aches, joint swelling, muscle aches, chest pain, shortness of breath, mood changes.   Objective  Blood pressure 126/82, pulse 69, height 5\' 2"  (1.575 m), weight 149 lb (67.6 kg), SpO2 97 %. Systems examined below as of    General: No apparent distress alert and oriented x3 mood and affect normal, dressed appropriately.  HEENT: Pupils equal, extraocular movements intact  Respiratory: Patient's speak in full sentences and does not appear short of breath  Cardiovascular: No lower extremity edema, non tender, no erythema  Skin: Warm dry intact with no signs of infection or rash on extremities or on axial skeleton.  Abdomen: Soft nontender  Neuro: Cranial nerves II through XII are intact, neurovascularly intact in all extremities with 2+ DTRs and 2+ pulses.  Lymph: No  lymphadenopathy of posterior or anterior cervical chain or axillae bilaterally.  Gait normal with good balance and coordination.  MSK:  Non tender with full range of motion and good stability and symmetric strength and tone of shoulders, elbows,  hip, knee and ankles bilaterally.  Mild to moderate arthritic changes  Right wrist exam shows the patient to have swelling and bruising noted.  Patient is tender to palpation of the anatomical snuffbox.  Patient has some limited range of motion in all planes.  Patient does have grip strength that is noted in 4-5 compared to the contralateral side.  Neurovascular intact distally.   Impression  and Recommendations:     This case required medical decision making of moderate complexity. The above documentation has been reviewed and is accurate and complete Vanessa Pulley, DO       Note: This dictation was prepared with Dragon dictation along with smaller phrase technology. Any transcriptional errors that result from this process are unintentional.

## 2018-01-07 NOTE — Assessment & Plan Note (Signed)
Patient has a right wrist injury.  On ultrasound there is a possibility for a scaphoid fracture.  Does have swelling and severe tenderness over the anatomical snuffbox.  Patient's x-rays did not show any significant acute findings but patient does have significant osteopenia versus possible osteoporosis.  Started once weekly vitamin D discussed bracing and given removable hard splint at the moment.  We discussed though continuing this could be in 4 to 6 weeks.  Patient will follow-up again in 2 to 3 weeks for further evaluation.  Will call with any doing medication for more pain control.  Once again follow-up in 2 to 3 weeks

## 2018-01-11 ENCOUNTER — Encounter: Payer: Self-pay | Admitting: Family Medicine

## 2018-01-24 NOTE — Progress Notes (Signed)
[name] [address] [place], [place] [address] Phone: [phone] Subjective:   Vanessa Melton, am serving as a scribe for Dr. Antoine Primas.   CC: Left wrist pain follow-up and new onset hip pain  BJY:NWGNFAOZHY  Vanessa Melton is a 84 y.o. female coming in with complaint of left wrist pain. She does not have pain any more unless she overuses her wrist.  Overall states 95% better.  Has developed right hip pain. Pain is over GT and radiates into the quad. Patient did fall onto that side in the same fall that injured her wrist. Pain wakes patient up at night. Pain with stair climbing and when lying supine. Patient has been using BC powder and CBD oil.       Past Medical History:  Diagnosis Date  . Allergy   . Anxiety   . Arthritis   . Cataract   . Hypertension    Past Surgical History:  Procedure Laterality Date  . CATARACT EXTRACTION W/ INTRAOCULAR LENS IMPLANT    . COSMETIC SURGERY    . EYE MUSCLE SURGERY    . EYE SURGERY    . FRACTURE SURGERY     Social History   Socioeconomic History  . Marital status: Significant Other    Spouse name: Not on file  . Number of children: 3  . Years of education: 59  . Highest education level: Doctorate  Occupational History  . Occupation: retired professor    Comment: [employer]  Social Needs  . Financial resource strain: Not hard at all  . Food insecurity:    Worry: Never true    Inability: Never true  . Transportation needs:    Medical: No    Non-medical: No  Tobacco Use  . Smoking status: Former Smoker    Years: 5.00    Types: Cigarettes    Last attempt to quit: 02/03/1957    Years since quitting: 61.0  . Smokeless tobacco: Never Used  . Tobacco comment: no plan to start again  Substance and Sexual Activity  . Alcohol use: Yes    Alcohol/week: 7.0 standard drinks    Types: 7 Glasses of wine per week    Comment: social  . Drug use: No  . Sexual activity: Yes    Birth  control/protection: None  Lifestyle  . Physical activity:    Days per week: 5 days    Minutes per session: 90 min  . Stress: Not at all  Relationships  . Social connections:    Talks on phone: More than three times a week    Gets together: More than three times a week    Attends religious service: More than 4 times per year    Active member of club or organization: Yes    Attends meetings of clubs or organizations: More than 4 times per year    Relationship status: Living with partner  Other Topics Concern  . Not on file  Social History Narrative   Significant other: Dr, [name]   Home is split level, 6 stairs. Handrails on stairs, has smoke alarms and fire extinguisher.   Exercises 5+ days a week 1-1.5 hours per day   Golfs   Shows horses--Arabian female named BOGO (after buying the mare she got him free--buy one get one=BOGO)   [address] [place]   Has an adopted Medical laboratory scientific officer.   Travels frequently   Wears seat belts in vehicles.  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 Beta Adrenergic Blockers Shortness Of Breath and Other (See Comments)    Chest pain, dizziness  . Candesartan Cilexetil-Hctz     Dyspnea on exertion  . Ace Inhibitors Cough  . Codeine     Headache,SOB, rash  . Combigan [Brimonidine Tartrate-Timolol]     Chest pain  . Doxazosin   . Lisinopril Cough  . Penicillins     Headache, sob, rash   Family History  Problem Relation Age of Onset  . Colon cancer Mother   . Emphysema Father   . Aneurysm Brother   . Stroke Brother   . Colon polyps Daughter   . Colon cancer Brother     Current Outpatient Medications (Endocrine & Metabolic):  .  levothyroxine (SYNTHROID, LEVOTHROID) 75 MCG tablet, Take 1 tablet (75 mcg total) by mouth daily.  Current Outpatient Medications (Cardiovascular):  .  amLODipine (NORVASC) 2.5 MG tablet, Take 1 tablet (2.5 mg total) by mouth daily.   Current Outpatient  Medications (Analgesics):  .  rizatriptan (MAXALT) 10 MG tablet, Take 1 tablet (10 mg total) by mouth as needed for migraine. May repeat in 2 hours if needed   Current Outpatient Medications (Other):  Marland Kitchen.  ALPRAZolam (XANAX) 0.5 MG tablet, Take one by mouth one hour prior to public speaking prn .  polyethylene glycol (GAVILYTE-G) 236 g solution,  .  sucralfate (CARAFATE) 1 GM/10ML suspension, TAKE 10 MLS BY MOUTH FOUR TIMES DAILY WITH MEALS AND AT BEDTIME .  Vitamin D, Ergocalciferol, (DRISDOL) 1.25 MG (50000 UT) CAPS capsule, Take 1 capsule (50,000 Units total) by mouth every 7 (seven) days.    Past medical history, social, surgical and family history all reviewed in electronic medical record.  No pertanent information unless stated regarding to the chief complaint.   Review of Systems:  No headache, visual changes, nausea, vomiting, diarrhea, constipation, dizziness, abdominal pain, skin rash, fevers, chills, night sweats, weight loss, swollen lymph nodes, body aches, joint swelling,  chest pain, shortness of breath, mood changes.  Mild positive muscle aches  Objective  Blood pressure (!) 132/96, pulse 71, height 5\' 2"  (1.575 m), weight 152 lb (68.9 kg), SpO2 99 %.   General: No apparent distress alert and oriented x3 mood and affect normal, dressed appropriately.  HEENT: Pupils equal, extraocular movements intact  Respiratory: Patient's speak in full sentences and does not appear short of breath  Cardiovascular: No lower extremity edema, non tender, no erythema  Skin: Warm dry intact with no signs of infection or rash on extremities or on axial skeleton.  Abdomen: Soft nontender  Neuro: Cranial nerves II through XII are intact, neurovascularly intact in all extremities with 2+ DTRs and 2+ pulses.  Lymph: No lymphadenopathy of posterior or anterior cervical chain or axillae bilaterally.  Gait normal with good balance and coordination.  MSK:  tender with full range of motion and good  stability and symmetric strength and tone of shoulders, elbows,, knee and ankles bilaterally.  Mild arthritic changes of multiple joints  Left wrist exam shows the patient does have near full range of motion.  Mild pain with O'Brien testing of the wrist.  Patient has full grip strength.  Neurovascular intact distally.  Right hip exam shows that patient does have severe tenderness to palpation over the greater trochanteric area.  Negative straight leg test.  Positive Faber test.  Minimal discomfort over the right sacroiliac joint.  Patient has near full  range of motion with lacking last 5 degrees of internal rotation.  Neurovascular intact distally.  [id number]; 15 additional minutes spent for Therapeutic exercises as stated in above notes.  This included exercises focusing on stretching, strengthening, with significant focus on eccentric aspects.   Long term goals include an improvement in range of motion, strength, endurance as well as avoiding reinjury. Patient's frequency would include in 1-2 times a day, 3-5 times a week for a duration of 6-12 weeks. Hip strengthening exercises which included:  Pelvic tilt/bracing to help with proper recruitment of the lower abs and pelvic floor muscles  Glute strengthening to properly contract glutes without over-engaging low back and hamstrings - prone hip extension and glute bridge exercises Proper stretching techniques to increase effectiveness for the hip flexors, groin, quads, piriformic and low back when appropriate    Proper technique shown and discussed handout in great detail with ATC.  All questions were discussed and answered.     Impression and Recommendations:      The above documentation has been reviewed and is accurate and complete Judi SaaZachary M Kemper Heupel, DO       Note: This dictation was prepared with Dragon dictation along with smaller phrase technology. Any transcriptional errors that result from this process are unintentional.

## 2018-01-25 ENCOUNTER — Ambulatory Visit: Payer: Medicare Other | Admitting: Family Medicine

## 2018-01-25 ENCOUNTER — Encounter: Payer: Self-pay | Admitting: Family Medicine

## 2018-01-25 ENCOUNTER — Ambulatory Visit: Payer: Self-pay

## 2018-01-25 ENCOUNTER — Ambulatory Visit (INDEPENDENT_AMBULATORY_CARE_PROVIDER_SITE_OTHER)
Admission: RE | Admit: 2018-01-25 | Discharge: 2018-01-25 | Disposition: A | Discharge status: Home / Self Care | Payer: Medicare Other | Source: Ambulatory Visit | Attending: Family Medicine | Admitting: Family Medicine

## 2018-01-25 VITALS — BP 132/96 | HR 71 | Ht 62.0 in | Wt 152.0 lb

## 2018-01-25 DIAGNOSIS — M7061 Trochanteric bursitis, right hip: Secondary | ICD-10-CM | POA: Diagnosis not present

## 2018-01-25 DIAGNOSIS — M25531 Pain in right wrist: Secondary | ICD-10-CM | POA: Diagnosis not present

## 2018-01-25 DIAGNOSIS — S6991XA Unspecified injury of right wrist, hand and finger(s), initial encounter: Secondary | ICD-10-CM

## 2018-01-25 DIAGNOSIS — M25551 Pain in right hip: Secondary | ICD-10-CM

## 2018-01-25 NOTE — Assessment & Plan Note (Signed)
Much improved overall.  Some mild arthritic changes.  Discussed icing regimen and home exercise.  Discussed which activities to do which wants to avoid.  Patient will start with range of motion.  Declined formal physical therapy.  Follow-up will discuss one more time in 4 weeks

## 2018-01-25 NOTE — Patient Instructions (Signed)
Good to see you  Ice is your friend Ice 20 minutes 2 times daily. Usually after activity and before bed. pennsaid pinkie amount topically 2 times daily as needed.  Exercises 3 times a week.  See me again in 4 weeks and if not better we can try an injection without the steroid and see how it goes.

## 2018-01-25 NOTE — Assessment & Plan Note (Signed)
Greater trochanteric bursitis. Believe the patient has this but differential includes a lumbar radiculopathy and facet arthropathy secondary to patient having more pain when laying supine.  Home exercises given, icing regimen, topical anti-inflammatories, patient work with athletic trainer to learn home exercises in greater detail.  Worsening symptoms will consider injection or formal physical therapy.  Follow-up again in 4 weeks

## 2018-01-29 ENCOUNTER — Encounter: Payer: Self-pay | Admitting: Family Medicine

## 2018-02-07 NOTE — Progress Notes (Signed)
[name] [address] [place], [place] [address] Phone: [phone] Subjective:   Vanessa Melton, am serving as a scribe for Dr. Antoine Primas.  CC: Right hip pain follow-up  BJY:[id number]  Vanessa Melton is a 85 y.o. female coming in with complaint of right hip pain.  Patient was actually seen 2 weeks ago.  Injected with a right-sided greater trochanteric bursa. Patient states that hip pain wakes her up. Pain continues daily.  Patient feels like it is worsened overall.  Found to have more of a greater trochanteric bursitis    Past Medical History:  Diagnosis Date  . Allergy   . Anxiety   . Arthritis   . Cataract   . Hypertension    Past Surgical History:  Procedure Laterality Date  . CATARACT EXTRACTION W/ INTRAOCULAR LENS IMPLANT    . COSMETIC SURGERY    . EYE MUSCLE SURGERY    . EYE SURGERY    . FRACTURE SURGERY     Social History   Socioeconomic History  . Marital status: Significant Other    Spouse name: Not on file  . Number of children: 3  . Years of education: 57  . Highest education level: Doctorate  Occupational History  . Occupation: retired professor    Comment: [employer]  Social Needs  . Financial resource strain: Not hard at all  . Food insecurity:    Worry: Never true    Inability: Never true  . Transportation needs:    Medical: No    Non-medical: No  Tobacco Use  . Smoking status: Former Smoker    Years: 5.00    Types: Cigarettes    Last attempt to quit: 02/03/1957    Years since quitting: 61.0  . Smokeless tobacco: Never Used  . Tobacco comment: no plan to start again  Substance and Sexual Activity  . Alcohol use: Yes    Alcohol/week: 7.0 standard drinks    Types: 7 Glasses of wine per week    Comment: social  . Drug use: No  . Sexual activity: Yes    Birth control/protection: None  Lifestyle  . Physical activity:    Days per week: 5 days    Minutes per session: 90 min  . Stress: Not at  all  Relationships  . Social connections:    Talks on phone: More than three times a week    Gets together: More than three times a week    Attends religious service: More than 4 times per year    Active member of club or organization: Yes    Attends meetings of clubs or organizations: More than 4 times per year    Relationship status: Living with partner  Other Topics Concern  . Not on file  Social History Narrative   Significant other: Dr, [name]   Home is split level, 6 stairs. Handrails on stairs, has smoke alarms and fire extinguisher.   Exercises 5+ days a week 1-1.5 hours per day   Golfs   Shows horses--Arabian female named BOGO (after buying the mare she got him free--buy one get one=BOGO)   All [address] [place]   Has an adopted Medical laboratory scientific officer.   Travels frequently   Wears seat belts in vehicles.      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 Beta Adrenergic Blockers Shortness Of Breath and Other (See Comments)    Chest pain,  dizziness  . Candesartan Cilexetil-Hctz     Dyspnea on exertion  . Ace Inhibitors Cough  . Codeine     Headache,SOB, rash  . Combigan [Brimonidine Tartrate-Timolol]     Chest pain  . Doxazosin   . Lisinopril Cough  . Penicillins     Headache, sob, rash   Family History  Problem Relation Age of Onset  . Colon cancer Mother   . Emphysema Father   . Aneurysm Brother   . Stroke Brother   . Colon polyps Daughter   . Colon cancer Brother     Current Outpatient Medications (Endocrine & Metabolic):  .  levothyroxine (SYNTHROID, LEVOTHROID) 75 MCG tablet, Take 1 tablet (75 mcg total) by mouth daily. .  predniSONE (DELTASONE) 50 MG tablet, Take 1 tablet (50 mg total) by mouth daily.  Current Outpatient Medications (Cardiovascular):  .  amLODipine (NORVASC) 2.5 MG tablet, Take 1 tablet (2.5 mg total) by mouth daily.   Current Outpatient Medications (Analgesics):  .  rizatriptan (MAXALT) 10 MG  tablet, Take 1 tablet (10 mg total) by mouth as needed for migraine. May repeat in 2 hours if needed   Current Outpatient Medications (Other):  Marland Kitchen.  ALPRAZolam (XANAX) 0.5 MG tablet, Take one by mouth one hour prior to public speaking prn .  polyethylene glycol (GAVILYTE-G) 236 g solution,  .  sucralfate (CARAFATE) 1 GM/10ML suspension, TAKE 10 MLS BY MOUTH FOUR TIMES DAILY WITH MEALS AND AT BEDTIME .  Vitamin D, Ergocalciferol, (DRISDOL) 1.25 MG (50000 UT) CAPS capsule, Take 1 capsule (50,000 Units total) by mouth every 7 (seven) days.    Past medical history, social, surgical and family history all reviewed in electronic medical record.  No pertanent information unless stated regarding to the chief complaint.   Review of Systems:  No headache, visual changes, nausea, vomiting, diarrhea, constipation, dizziness, abdominal pain, skin rash, fevers, chills, night sweats, weight loss, swollen lymph nodes, body aches, joint swelling,  chest pain, shortness of breath, mood changes.  Positive muscle aches  Objective  Blood pressure 138/90, pulse 65, height 5\' 2"  (1.575 m), weight 149 lb (67.6 kg).    General: No apparent distress alert and oriented x3 mood and affect normal, dressed appropriately.  HEENT: Pupils equal, extraocular movements intact  Respiratory: Patient's speak in full sentences and does not appear short of breath  Cardiovascular: No lower extremity edema, non tender, no erythema  Skin: Warm dry intact with no signs of infection or rash on extremities or on axial skeleton.  Abdomen: Soft nontender  Neuro: Cranial nerves II through XII are intact, neurovascularly intact in all extremities with 2+ DTRs and 2+ pulses.  Lymph: No lymphadenopathy of posterior or anterior cervical chain or axillae bilaterally.  Gait severely antalgic MSK:  tender with limited range of motion and good stability and symmetric strength and tone of shoulders, elbows, wrist, , knee and ankles bilaterally.    Right hip severe pain in all range of motion at the moment.  Negative straight leg test.  Pain with internal range of motion as well as pain in the groin.  Severe pain though on the lateral aspect of the hip   Procedure: Real-time Ultrasound Guided Injection of right greater trochanteric bursitis secondary to patient's body habitus Device: GE Logiq Q7 Ultrasound guided injection is preferred based studies that show increased duration, increased effect, greater accuracy, decreased procedural pain, increased response rate, and decreased cost with ultrasound guided versus blind injection.  Verbal informed consent obtained.  Time-out  conducted.  Noted no overlying erythema, induration, or other signs of local infection.  Skin prepped in a sterile fashion.  Local anesthesia: Topical Ethyl chloride.  With sterile technique and under real time ultrasound guidance:  Greater trochanteric area was visualized and patient's bursa was noted. A 22-gauge 3 inch needle was inserted and 4 cc of 0.5% Marcaine and 1 cc of Kenalog 40 mg/dL was injected. Pictures taken Completed without difficulty  Pain immediately resolved suggesting accurate placement of the medication.  Advised to call if fevers/chills, erythema, induration, drainage, or persistent bleeding.  Images permanently stored and available for review in the ultrasound unit.  Impression: Technically successful ultrasound guided injection.     Impression and Recommendations:     This case required medical decision making of moderate complexity. The above documentation has been reviewed and is accurate and complete Judi SaaZachary M , DO       Note: This dictation was prepared with Dragon dictation along with smaller phrase technology. Any transcriptional errors that result from this process are unintentional.

## 2018-02-08 ENCOUNTER — Ambulatory Visit: Payer: Medicare Other | Admitting: Family Medicine

## 2018-02-08 ENCOUNTER — Encounter: Payer: Self-pay | Admitting: Family Medicine

## 2018-02-08 ENCOUNTER — Ambulatory Visit: Payer: Self-pay

## 2018-02-08 ENCOUNTER — Ambulatory Visit (INDEPENDENT_AMBULATORY_CARE_PROVIDER_SITE_OTHER)
Admission: RE | Admit: 2018-02-08 | Discharge: 2018-02-08 | Disposition: A | Discharge status: Home / Self Care | Payer: Medicare Other | Source: Ambulatory Visit | Attending: Family Medicine | Admitting: Family Medicine

## 2018-02-08 VITALS — BP 138/90 | HR 65 | Ht 62.0 in | Wt 149.0 lb

## 2018-02-08 DIAGNOSIS — M7061 Trochanteric bursitis, right hip: Secondary | ICD-10-CM

## 2018-02-08 DIAGNOSIS — M25551 Pain in right hip: Secondary | ICD-10-CM | POA: Diagnosis not present

## 2018-02-08 MED ORDER — PREDNISONE 50 MG PO TABS: 50.0000 mg | ORAL_TABLET | Freq: Every day | ORAL | 0 refills | Status: DC

## 2018-02-08 NOTE — Assessment & Plan Note (Signed)
Injection today.  Tolerated the procedure well.  Concern for more of a intra-articular pathology as well so x-rays ordered today.  X-rays of patient's back was fairly unremarkable previously.  Patient given prednisone for any breakthrough pain.  Discussed icing regimen and home exercise.  Follow-up again in 2 to 3 weeks.

## 2018-02-08 NOTE — Patient Instructions (Addendum)
Good to see you  Ice is your friend INjected today to make it better Continue everything else If not better by tomorrow try presdnisone daily for 5 days.  If diarrhea gets worse  See me again in 4ish weeks

## 2018-02-22 NOTE — Progress Notes (Signed)
[facility] [address] [place], [place] [address] Phone: [phone] Subjective:    I Ronelle NighKana Thompson am serving as a Neurosurgeonscribe for Dr. Antoine PrimasZachary .    CC: Right hip pain follow-up  BJY:NWGNFAOZHYHPI:Subjective  Vanessa Melton is a 85 y.o. female coming in with complaint of hip pain. States that she is not doing well. Very stiff. 8/10. Worked out at [employer] gym today. Patient states even going up stairs have become more difficult.  Patient was unable to actually support all her weight on the leg at the moment.  Denies any groin pain.  Still states that most the pain is on the lateral aspect of the hip.  Denies any fevers chills or any abnormal weight loss.   Patient was previously seen and diagnosed with more of a greater trochanteric bursitis.  Given injection February 08, 2018. X-rays of lumbar spine showed scoliosis of the lumbar spine moderate to space narrowing from L1-L5  Past Medical History:  Diagnosis Date  . Allergy   . Anxiety   . Arthritis   . Cataract   . Hypertension    Past Surgical History:  Procedure Laterality Date  . CATARACT EXTRACTION W/ INTRAOCULAR LENS IMPLANT    . COSMETIC SURGERY    . EYE MUSCLE SURGERY    . EYE SURGERY    . FRACTURE SURGERY     Social History   Socioeconomic History  . Marital status: Significant Other    Spouse name: Not on file  . Number of children: 3  . Years of education: 4821  . Highest education level: Doctorate  Occupational History  . Occupation: retired professor    Comment: [employer]  Social Needs  . Financial resource strain: Not hard at all  . Food insecurity:    Worry: Never true    Inability: Never true  . Transportation needs:    Medical: No    Non-medical: No  Tobacco Use  . Smoking status: Former Smoker    Years: 5.00    Types: Cigarettes    Last attempt to quit: 02/03/1957    Years since quitting: 61.0  . Smokeless tobacco: Never Used  . Tobacco comment: no plan to start again    Substance and Sexual Activity  . Alcohol use: Yes    Alcohol/week: 7.0 standard drinks    Types: 7 Glasses of wine per week    Comment: social  . Drug use: No  . Sexual activity: Yes    Birth control/protection: None  Lifestyle  . Physical activity:    Days per week: 5 days    Minutes per session: 90 min  . Stress: Not at all  Relationships  . Social connections:    Talks on phone: More than three times a week    Gets together: More than three times a week    Attends religious service: More than 4 times per year    Active member of club or organization: Yes    Attends meetings of clubs or organizations: More than 4 times per year    Relationship status: Living with partner  Other Topics Concern  . Not on file  Social History Narrative   Significant other: Dr, Katherine Roanavid Boger   Home is split level, 6 stairs. Handrails on stairs, has smoke alarms and fire extinguisher.   Exercises 5+ days a week 1-1.5 hours per day   Golfs   Shows horses--Arabian female named BOGO (after buying the mare she got  him free--buy one get one=BOGO)   [employer]   Has an adopted cat.   Travels frequently   Wears seat belts in vehicles.      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 Beta Adrenergic Blockers Shortness Of Breath and Other (See Comments)    Chest pain, dizziness  . Candesartan Cilexetil-Hctz     Dyspnea on exertion  . Ace Inhibitors Cough  . Codeine     Headache,SOB, rash  . Combigan [Brimonidine Tartrate-Timolol]     Chest pain  . Doxazosin   . Lisinopril Cough  . Penicillins     Headache, sob, rash   Family History  Problem Relation Age of Onset  . Colon cancer Mother   . Emphysema Father   . Aneurysm Brother   . Stroke Brother   . Colon polyps Daughter   . Colon cancer Brother     Current Outpatient Medications (Endocrine & Metabolic):  .  levothyroxine (SYNTHROID, LEVOTHROID) 75 MCG tablet, Take 1 tablet  (75 mcg total) by mouth daily. .  predniSONE (DELTASONE) 50 MG tablet, Take 1 tablet (50 mg total) by mouth daily.  Current Outpatient Medications (Cardiovascular):  .  amLODipine (NORVASC) 2.5 MG tablet, Take 1 tablet (2.5 mg total) by mouth daily.   Current Outpatient Medications (Analgesics):  .  rizatriptan (MAXALT) 10 MG tablet, Take 1 tablet (10 mg total) by mouth as needed for migraine. May repeat in 2 hours if needed   Current Outpatient Medications (Other):  Marland Kitchen.  ALPRAZolam (XANAX) 0.5 MG tablet, Take one by mouth one hour prior to public speaking prn .  polyethylene glycol (GAVILYTE-G) 236 g solution,  .  sucralfate (CARAFATE) 1 GM/10ML suspension, TAKE 10 MLS BY MOUTH FOUR TIMES DAILY WITH MEALS AND AT BEDTIME .  Vitamin D, Ergocalciferol, (DRISDOL) 1.25 MG (50000 UT) CAPS capsule, Take 1 capsule (50,000 Units total) by mouth every 7 (seven) days.    Past medical history, social, surgical and family history all reviewed in electronic medical record.  No pertanent information unless stated regarding to the chief complaint.   Review of Systems:  No headache, visual changes, nausea, vomiting, diarrhea, constipation, dizziness, abdominal pain, skin rash, fevers, chills, night sweats, weight loss, swollen lymph nodes, body aches, joint swelling, muscle aches, chest pain, shortness of breath, mood changes.   Objective  There were no vitals taken for this visit. Systems examined below as of    General: No apparent distress alert and oriented x3 mood and affect normal, dressed appropriately.  HEENT: Pupils equal, extraocular movements intact  Respiratory: Patient's speak in full sentences and does not appear short of breath  Cardiovascular: trace lower extremity edema, non tender, no erythema  Skin: Warm dry intact with no signs of infection or rash on extremities or on axial skeleton.  Abdomen: Soft nontender  Neuro: Cranial nerves II through XII are intact, neurovascularly intact  in all extremities with 2+ DTRs and 2+ pulses.  Lymph: No lymphadenopathy of posterior or anterior cervical chain or axillae bilaterally.  Gait severely antalgic with worsening symptoms MSK: Mild to moderate tender with mild to moderate range of motion and good stability and symmetric strength and tone of shoulders, elbows, wrist, knee and ankles bilaterally.  Hip: Right ROM IR: 15 Deg, ER: 15 Deg, Flexion: 80 Deg, Extension: 20 Deg, Abduction: 35 Deg, Adduction: 35 Deg Patient is having some weakness on this side of the hip.  Patient  has 4-5 strength in most range of motion and strength testing compared to the contralateral side.  Deep tendon reflexes are intact.  Mild positive straight leg test with worsening pain in the back as well.  Neurovascular intact distally.   Impression and Recommendations:     This case required medical decision making of moderate complexity. The above documentation has been reviewed and is accurate and complete Judi Saa, DO       Note: This dictation was prepared with Dragon dictation along with smaller phrase technology. Any transcriptional errors that result from this process are unintentional.

## 2018-02-23 ENCOUNTER — Ambulatory Visit: Payer: Medicare Other | Admitting: Family Medicine

## 2018-02-23 ENCOUNTER — Encounter: Payer: Self-pay | Admitting: Family Medicine

## 2018-02-23 VITALS — BP 136/80 | HR 68 | Ht 62.0 in | Wt 149.0 lb

## 2018-02-23 DIAGNOSIS — M7061 Trochanteric bursitis, right hip: Secondary | ICD-10-CM

## 2018-02-23 DIAGNOSIS — G8929 Other chronic pain: Secondary | ICD-10-CM

## 2018-02-23 DIAGNOSIS — M25551 Pain in right hip: Secondary | ICD-10-CM | POA: Diagnosis not present

## 2018-02-23 MED ORDER — DIAZEPAM 5 MG PO TABS: ORAL_TABLET | ORAL | 0 refills | Status: DC

## 2018-02-23 NOTE — Assessment & Plan Note (Signed)
Patient is not making any significant improvement.  Had a very small improvement after the injection but continues now to have pain.  Patient's walking weakness of the hip significantly worse.  Differential includes a lumbar radiculopathy, as well as a potential for an occult fracture.  Patient did have x-rays that have shown very mild osteoarthritic changes.  X-rays of the back low did show an L4-L5 degenerative disc disease that could be contributing.  Discussed with patient in great length and does feel because of this affecting daily activities patient is to have advanced imaging including the MRI of the back and hip and then after this we will further evaluate to see what will be the best treatment option.  All of patient's questions answered.  Spent  25 minutes with patient face-to-face and had greater than 50% of counseling including as described above in assessment and plan.

## 2018-02-23 NOTE — Patient Instructions (Signed)
Good to see you again  I am sorry you are not getting better We will get MRI of the back and hip to see what is going on.  Call [phone] to schedule Called in valium for you  I will write you after I get the mri to discuss our options.

## 2018-03-03 ENCOUNTER — Ambulatory Visit
Admission: RE | Admit: 2018-03-03 | Discharge: 2018-03-03 | Disposition: A | Discharge status: Home / Self Care | Payer: Medicare Other | Source: Ambulatory Visit | Attending: Family Medicine | Admitting: Family Medicine

## 2018-03-03 DIAGNOSIS — G8929 Other chronic pain: Secondary | ICD-10-CM

## 2018-03-03 DIAGNOSIS — M25551 Pain in right hip: Principal | ICD-10-CM

## 2018-03-04 ENCOUNTER — Other Ambulatory Visit: Payer: Self-pay | Admitting: *Deleted

## 2018-03-04 ENCOUNTER — Telehealth: Payer: Self-pay | Admitting: Family Medicine

## 2018-03-04 DIAGNOSIS — S32401A Unspecified fracture of right acetabulum, initial encounter for closed fracture: Secondary | ICD-10-CM

## 2018-03-04 NOTE — Telephone Encounter (Signed)
Called patient, discussed results, referred to orthopedic surgery to discuss other surgical options.  Patient has made mild improvement but still states if she moves her leg wound has severe pain.  Only doing daily activities at the moment.

## 2018-03-04 NOTE — Telephone Encounter (Signed)
Vanessa Melton with [facility] calling about MRI of hip yesterday with critical findings. See "Impression." Spoke with Vanessa Melton in the office. Will route high priority.

## 2018-03-05 ENCOUNTER — Telehealth: Payer: Self-pay

## 2018-03-05 NOTE — Telephone Encounter (Signed)
Called pt, advised her the referral was sent to [facility] as Urgent, Provided pt with [facility]'s phone number & if she has any trouble to call me back.

## 2018-03-05 NOTE — Telephone Encounter (Signed)
Patient left message asking to speak to PCP about hip MRI that showed several fractures.  Call back is [phone]  Ples Specter, RN [facility] RN)

## 2018-03-05 NOTE — Telephone Encounter (Signed)
Patient called to check the status of her referral to an ortho doctor.  Please advise and call patient back as soon as possible.  CB# [phone]

## 2018-03-08 NOTE — Telephone Encounter (Signed)
Spoke with patient.  She has appointment on Feb 11 with Dr. Luiz Blare.  She would prefer to see Dr. Sula Rumple in mid Dec.  Now has suspected non displaced fx of femoral neck and acetabulum.  Has been wt bearing.   OK to keep walking but limit activity to pain.

## 2018-03-09 ENCOUNTER — Telehealth: Payer: Self-pay | Admitting: Family Medicine

## 2018-03-09 DIAGNOSIS — S72009S Fracture of unspecified part of neck of unspecified femur, sequela: Secondary | ICD-10-CM

## 2018-03-09 NOTE — Telephone Encounter (Signed)
Dr. Jennette Kettle will be out tomorrow but asked me to have white team reach out to this patient. Patient has an appointment on Thursday with orthopedist for hip fracture. Dr. Jennette Kettle just received a message from the orthopedist advising that patient should be nonweightbearing with a rolling walker.  White team/RN team, can you facilitate patient getting a rolling walker Wednesday 2/5? I have placed an order in Epic.   Please also instruct patient to not bear weight except with the use of the walker.  Routing to white team & [facility] RN team.  Latrelle Dodrill, MD

## 2018-03-09 NOTE — Progress Notes (Deleted)
Tawana ScaleZach Tramane Gorum D.O. [facility] [address] [place], [place] [address] Phone: [phone] Subjective:    I'm seeing this patient by the request  of:    CC:   BJY:NWGNFAOZHYHPI:Subjective  Vanessa Melton is a 85 y.o. female coming in with complaint of ***  Onset-  Location Duration-  Character- Aggravating factors- Reliving factors-  Therapies tried-  Severity-     Past Medical History:  Diagnosis Date  . Allergy   . Anxiety   . Arthritis   . Cataract   . Hypertension    Past Surgical History:  Procedure Laterality Date  . CATARACT EXTRACTION W/ INTRAOCULAR LENS IMPLANT    . COSMETIC SURGERY    . EYE MUSCLE SURGERY    . EYE SURGERY    . FRACTURE SURGERY     Social History   Socioeconomic History  . Marital status: Significant Other    Spouse name: Not on file  . Number of children: 3  . Years of education: 6721  . Highest education level: Doctorate  Occupational History  . Occupation: retired professor    Comment: [employer]  Social Needs  . Financial resource strain: Not hard at all  . Food insecurity:    Worry: Never true    Inability: Never true  . Transportation needs:    Medical: No    Non-medical: No  Tobacco Use  . Smoking status: Former Smoker    Years: 5.00    Types: Cigarettes    Last attempt to quit: 02/03/1957    Years since quitting: 61.1  . Smokeless tobacco: Never Used  . Tobacco comment: no plan to start again  Substance and Sexual Activity  . Alcohol use: Yes    Alcohol/week: 7.0 standard drinks    Types: 7 Glasses of wine per week    Comment: social  . Drug use: No  . Sexual activity: Yes    Birth control/protection: None  Lifestyle  . Physical activity:    Days per week: 5 days    Minutes per session: 90 min  . Stress: Not at all  Relationships  . Social connections:    Talks on phone: More than three times a week    Gets together: More than three times a week    Attends religious service: More than 4 times per  year    Active member of club or organization: Yes    Attends meetings of clubs or organizations: More than 4 times per year    Relationship status: Living with partner  Other Topics Concern  . Not on file  Social History Narrative   Significant other: Dr, Katherine Roanavid Boger   Home is split level, 6 stairs. Handrails on stairs, has smoke alarms and fire extinguisher.   Exercises 5+ days a week 1-1.5 hours per day   Golfs   Shows horses--Arabian female named BOGO (after buying the mare she got him free--buy one get one=BOGO)   [employer]   Has an adopted Medical laboratory scientific officercat.   Travels frequently   Wears seat belts in vehicles.      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 Beta Adrenergic Blockers Shortness Of Breath and Other (See Comments)    Chest pain, dizziness  . Candesartan Cilexetil-Hctz     Dyspnea on exertion  . Ace Inhibitors Cough  . Codeine     Headache,SOB, rash  . Combigan [Brimonidine Tartrate-Timolol]  Chest pain  . Doxazosin   . Lisinopril Cough  . Penicillins     Headache, sob, rash   Family History  Problem Relation Age of Onset  . Colon cancer Mother   . Emphysema Father   . Aneurysm Brother   . Stroke Brother   . Colon polyps Daughter   . Colon cancer Brother     Current Outpatient Medications (Endocrine & Metabolic):  .  levothyroxine (SYNTHROID, LEVOTHROID) 75 MCG tablet, Take 1 tablet (75 mcg total) by mouth daily. .  predniSONE (DELTASONE) 50 MG tablet, Take 1 tablet (50 mg total) by mouth daily.  Current Outpatient Medications (Cardiovascular):  .  amLODipine (NORVASC) 2.5 MG tablet, Take 1 tablet (2.5 mg total) by mouth daily.   Current Outpatient Medications (Analgesics):  .  rizatriptan (MAXALT) 10 MG tablet, Take 1 tablet (10 mg total) by mouth as needed for migraine. May repeat in 2 hours if needed   Current Outpatient Medications (Other):  Marland Kitchen.  ALPRAZolam (XANAX) 0.5 MG tablet, Take one by  mouth one hour prior to public speaking prn .  diazepam (VALIUM) 5 MG tablet, One tab by mouth, 2 hours before procedure. .  polyethylene glycol (GAVILYTE-G) 236 g solution,  .  sucralfate (CARAFATE) 1 GM/10ML suspension, TAKE 10 MLS BY MOUTH FOUR TIMES DAILY WITH MEALS AND AT BEDTIME .  Vitamin D, Ergocalciferol, (DRISDOL) 1.25 MG (50000 UT) CAPS capsule, Take 1 capsule (50,000 Units total) by mouth every 7 (seven) days.    Past medical history, social, surgical and family history all reviewed in electronic medical record.  No pertanent information unless stated regarding to the chief complaint.   Review of Systems:  No headache, visual changes, nausea, vomiting, diarrhea, constipation, dizziness, abdominal pain, skin rash, fevers, chills, night sweats, weight loss, swollen lymph nodes, body aches, joint swelling, muscle aches, chest pain, shortness of breath, mood changes.   Objective  There were no vitals taken for this visit. Systems examined below as of    General: No apparent distress alert and oriented x3 mood and affect normal, dressed appropriately.  HEENT: Pupils equal, extraocular movements intact  Respiratory: Patient's speak in full sentences and does not appear short of breath  Cardiovascular: No lower extremity edema, non tender, no erythema  Skin: Warm dry intact with no signs of infection or rash on extremities or on axial skeleton.  Abdomen: Soft nontender  Neuro: Cranial nerves II through XII are intact, neurovascularly intact in all extremities with 2+ DTRs and 2+ pulses.  Lymph: No lymphadenopathy of posterior or anterior cervical chain or axillae bilaterally.  Gait normal with good balance and coordination.  MSK:  Non tender with full range of motion and good stability and symmetric strength and tone of shoulders, elbows, wrist, hip, knee and ankles bilaterally.     Impression and Recommendations:     This case required medical decision making of moderate  complexity. The above documentation has been reviewed and is accurate and complete Vanessa SaaZachary M Rook Maue, DO       Note: This dictation was prepared with Dragon dictation along with smaller phrase technology. Any transcriptional errors that result from this process are unintentional.

## 2018-03-10 ENCOUNTER — Ambulatory Visit: Payer: Medicare Other | Admitting: Family Medicine

## 2018-03-10 ENCOUNTER — Other Ambulatory Visit: Payer: Self-pay

## 2018-03-10 ENCOUNTER — Encounter (HOSPITAL_COMMUNITY): Payer: Self-pay | Admitting: *Deleted

## 2018-03-10 NOTE — Telephone Encounter (Signed)
Pt informed.  Community message sent to [facility] Marylene Land Catron and [facility]) and also faxed order to [facility] @ [phone]. Kaylia Winborne, Maryjo Rochester, CMA

## 2018-03-11 ENCOUNTER — Inpatient Hospital Stay (HOSPITAL_COMMUNITY): Admission: RE | Admit: 2018-03-11 | Payer: Medicare Other | Source: Home / Self Care | Admitting: Orthopedic Surgery

## 2018-03-11 HISTORY — DX: Hypothyroidism, unspecified: E03.9

## 2018-03-11 HISTORY — DX: Gastro-esophageal reflux disease without esophagitis: K21.9

## 2018-03-11 SURGERY — FIXATION, FEMUR, NECK, PERCUTANEOUS, USING SCREW
Anesthesia: General | Laterality: Right

## 2018-05-03 ENCOUNTER — Emergency Department (HOSPITAL_COMMUNITY): Payer: Medicare Other

## 2018-05-03 ENCOUNTER — Encounter (HOSPITAL_COMMUNITY): Payer: Self-pay | Admitting: Cardiology

## 2018-05-03 ENCOUNTER — Observation Stay (HOSPITAL_BASED_OUTPATIENT_CLINIC_OR_DEPARTMENT_OTHER): Payer: Medicare Other

## 2018-05-03 ENCOUNTER — Other Ambulatory Visit: Payer: Self-pay

## 2018-05-03 ENCOUNTER — Observation Stay (HOSPITAL_COMMUNITY)
Admission: EM | Admit: 2018-05-03 | Discharge: 2018-05-03 | Disposition: A | Discharge status: Home / Self Care | Payer: Medicare Other | Attending: Family Medicine | Admitting: Family Medicine

## 2018-05-03 DIAGNOSIS — G8929 Other chronic pain: Secondary | ICD-10-CM | POA: Diagnosis not present

## 2018-05-03 DIAGNOSIS — I088 Other rheumatic multiple valve diseases: Secondary | ICD-10-CM | POA: Diagnosis not present

## 2018-05-03 DIAGNOSIS — K5730 Diverticulosis of large intestine without perforation or abscess without bleeding: Secondary | ICD-10-CM | POA: Diagnosis not present

## 2018-05-03 DIAGNOSIS — Z79890 Hormone replacement therapy (postmenopausal): Secondary | ICD-10-CM | POA: Insufficient documentation

## 2018-05-03 DIAGNOSIS — R0789 Other chest pain: Secondary | ICD-10-CM | POA: Diagnosis not present

## 2018-05-03 DIAGNOSIS — Z79899 Other long term (current) drug therapy: Secondary | ICD-10-CM | POA: Diagnosis not present

## 2018-05-03 DIAGNOSIS — G43909 Migraine, unspecified, not intractable, without status migrainosus: Secondary | ICD-10-CM | POA: Diagnosis not present

## 2018-05-03 DIAGNOSIS — Z87891 Personal history of nicotine dependence: Secondary | ICD-10-CM | POA: Insufficient documentation

## 2018-05-03 DIAGNOSIS — I10 Essential (primary) hypertension: Secondary | ICD-10-CM | POA: Insufficient documentation

## 2018-05-03 DIAGNOSIS — F419 Anxiety disorder, unspecified: Secondary | ICD-10-CM | POA: Diagnosis not present

## 2018-05-03 DIAGNOSIS — R079 Chest pain, unspecified: Principal | ICD-10-CM | POA: Insufficient documentation

## 2018-05-03 DIAGNOSIS — I700 Atherosclerosis of aorta: Secondary | ICD-10-CM

## 2018-05-03 DIAGNOSIS — E039 Hypothyroidism, unspecified: Secondary | ICD-10-CM | POA: Insufficient documentation

## 2018-05-03 DIAGNOSIS — M25519 Pain in unspecified shoulder: Secondary | ICD-10-CM | POA: Diagnosis not present

## 2018-05-03 DIAGNOSIS — Z7952 Long term (current) use of systemic steroids: Secondary | ICD-10-CM | POA: Insufficient documentation

## 2018-05-03 DIAGNOSIS — M8580 Other specified disorders of bone density and structure, unspecified site: Secondary | ICD-10-CM | POA: Insufficient documentation

## 2018-05-03 DIAGNOSIS — M1990 Unspecified osteoarthritis, unspecified site: Secondary | ICD-10-CM | POA: Insufficient documentation

## 2018-05-03 DIAGNOSIS — R739 Hyperglycemia, unspecified: Secondary | ICD-10-CM | POA: Diagnosis not present

## 2018-05-03 DIAGNOSIS — K219 Gastro-esophageal reflux disease without esophagitis: Secondary | ICD-10-CM | POA: Diagnosis not present

## 2018-05-03 DIAGNOSIS — I341 Nonrheumatic mitral (valve) prolapse: Secondary | ICD-10-CM | POA: Diagnosis not present

## 2018-05-03 HISTORY — DX: Nonrheumatic mitral (valve) prolapse: I34.1

## 2018-05-03 HISTORY — DX: Other chest pain: R07.89

## 2018-05-03 HISTORY — DX: Atherosclerosis of aorta: I70.0

## 2018-05-03 LAB — D-DIMER, QUANTITATIVE (NOT AT ARMC): D DIMER QUANT: 0.48 ug{FEU}/mL (ref 0.00–0.50)

## 2018-05-03 LAB — TROPONIN I
Troponin I: <0.03 ng/mL (ref <0.03)
Troponin I: <0.03 ng/mL (ref <0.03)
Troponin I: <0.03 ng/mL (ref <0.03)

## 2018-05-03 LAB — BASIC METABOLIC PANEL
Anion gap: 10 (ref 5–15)
BUN: 13 mg/dL (ref 8–23)
CO2: 22 mmol/L (ref 22–32)
Calcium: 10.1 mg/dL (ref 8.9–10.3)
Chloride: 107 mmol/L (ref 98–111)
Creatinine, Ser: 0.77 mg/dL (ref 0.44–1.00)
GFR calc Af Amer: >60 mL/min (ref >60)
GFR calc non Af Amer: >60 mL/min (ref >60)
Glucose, Bld: 106 mg/dL — ABNORMAL HIGH (ref 70–99)
Potassium: 3.8 mmol/L (ref 3.5–5.1)
SODIUM: 139 mmol/L (ref 135–145)

## 2018-05-03 LAB — CBC
HCT: 43.6 % (ref 36.0–46.0)
Hemoglobin: 13.8 g/dL (ref 12.0–15.0)
MCH: 28.8 pg (ref 26.0–34.0)
MCHC: 31.7 g/dL (ref 30.0–36.0)
MCV: 90.8 fL (ref 80.0–100.0)
Platelets: 167 10*3/uL (ref 150–400)
RBC: 4.8 MIL/uL (ref 3.87–5.11)
RDW: 13.9 % (ref 11.5–15.5)
WBC: 8.1 10*3/uL (ref 4.0–10.5)
nRBC: 0 % (ref 0.0–0.2)

## 2018-05-03 LAB — LIPID PANEL
Cholesterol: 179 mg/dL (ref 0–200)
HDL: 65 mg/dL (ref >40)
LDL Cholesterol: 104 mg/dL — ABNORMAL HIGH (ref 0–99)
Total CHOL/HDL Ratio: 2.8 RATIO
Triglycerides: 52 mg/dL (ref <150)
VLDL: 10 mg/dL (ref 0–40)

## 2018-05-03 LAB — MRSA PCR SCREENING: MRSA by PCR: NEGATIVE

## 2018-05-03 LAB — ECHOCARDIOGRAM LIMITED
Height: 62 in
Weight: 2462.1 oz

## 2018-05-03 LAB — LIPASE, BLOOD: Lipase: 50 U/L (ref 11–51)

## 2018-05-03 LAB — TSH: TSH: 3.051 u[IU]/mL (ref 0.350–4.500)

## 2018-05-03 MED ORDER — SODIUM CHLORIDE 0.9% FLUSH
3.0000 mL | Freq: Once | INTRAVENOUS | Status: AC
  Administered 2018-05-03: 3 mL via INTRAVENOUS

## 2018-05-03 MED ORDER — FENTANYL CITRATE (PF) 100 MCG/2ML IJ SOLN
50.0000 ug | Freq: Once | INTRAMUSCULAR | Status: AC
  Administered 2018-05-03: 50 ug via INTRAVENOUS
  Filled 2018-05-03: qty 2

## 2018-05-03 MED ORDER — NITROGLYCERIN 0.4 MG SL SUBL
0.4000 mg | SUBLINGUAL_TABLET | SUBLINGUAL | Status: AC | PRN
  Administered 2018-05-03 (×3): 0.4 mg via SUBLINGUAL
  Filled 2018-05-03 (×2): qty 1

## 2018-05-03 MED ORDER — ASPIRIN 81 MG PO CHEW: 324.0000 mg | CHEWABLE_TABLET | ORAL | Status: DC

## 2018-05-03 MED ORDER — ONDANSETRON HCL 4 MG/2ML IJ SOLN
4.0000 mg | Freq: Once | INTRAMUSCULAR | Status: AC
  Administered 2018-05-03: 4 mg via INTRAVENOUS
  Filled 2018-05-03: qty 2

## 2018-05-03 MED ORDER — ONDANSETRON HCL 4 MG/2ML IJ SOLN: 4.0000 mg | Freq: Four times a day (QID) | INTRAMUSCULAR | Status: DC | PRN

## 2018-05-03 MED ORDER — ALUM & MAG HYDROXIDE-SIMETH 200-200-20 MG/5ML PO SUSP
30.0000 mL | Freq: Once | ORAL | Status: AC
  Administered 2018-05-03: 30 mL via ORAL
  Filled 2018-05-03: qty 30

## 2018-05-03 MED ORDER — ENOXAPARIN SODIUM 40 MG/0.4ML ~~LOC~~ SOLN
40.0000 mg | Freq: Every day | SUBCUTANEOUS | Status: DC
  Administered 2018-05-03: 40 mg via SUBCUTANEOUS
  Filled 2018-05-03: qty 0.4

## 2018-05-03 MED ORDER — SUMATRIPTAN SUCCINATE 50 MG PO TABS: 50.0000 mg | ORAL_TABLET | ORAL | Status: DC | PRN

## 2018-05-03 MED ORDER — ASPIRIN 81 MG PO CHEW
324.0000 mg | CHEWABLE_TABLET | Freq: Once | ORAL | Status: AC
  Administered 2018-05-03: 324 mg via ORAL
  Filled 2018-05-03: qty 4

## 2018-05-03 MED ORDER — AMLODIPINE BESYLATE 5 MG PO TABS: 2.5000 mg | ORAL_TABLET | Freq: Every day | ORAL | Status: DC

## 2018-05-03 MED ORDER — LEVOTHYROXINE SODIUM 75 MCG PO TABS
75.0000 ug | ORAL_TABLET | Freq: Every day | ORAL | Status: DC
  Administered 2018-05-03: 75 ug via ORAL
  Filled 2018-05-03: qty 1

## 2018-05-03 MED ORDER — NITROGLYCERIN 0.4 MG SL SUBL: 0.4000 mg | SUBLINGUAL_TABLET | SUBLINGUAL | Status: DC | PRN

## 2018-05-03 MED ORDER — ACETAMINOPHEN 325 MG PO TABS: 650.0000 mg | ORAL_TABLET | ORAL | Status: DC | PRN

## 2018-05-03 MED ORDER — LIDOCAINE VISCOUS HCL 2 % MT SOLN: 15.0000 mL | Freq: Once | OROMUCOSAL | Status: DC

## 2018-05-03 MED ORDER — FAMOTIDINE 20 MG PO TABS: 20.0000 mg | ORAL_TABLET | Freq: Every day | ORAL | 0 refills | Status: DC

## 2018-05-03 MED ORDER — ASPIRIN EC 81 MG PO TBEC
81.0000 mg | DELAYED_RELEASE_TABLET | Freq: Every day | ORAL | Status: DC
  Administered 2018-05-03: 81 mg via ORAL
  Filled 2018-05-03: qty 1

## 2018-05-03 MED ORDER — NAPROXEN 250 MG PO TABS
500.0000 mg | ORAL_TABLET | Freq: Two times a day (BID) | ORAL | Status: DC
  Filled 2018-05-03: qty 2

## 2018-05-03 MED ORDER — ASPIRIN 300 MG RE SUPP: 300.0000 mg | RECTAL | Status: DC

## 2018-05-03 MED ORDER — ROSUVASTATIN CALCIUM 5 MG PO TABS: 5.0000 mg | ORAL_TABLET | Freq: Every day | ORAL | Status: DC

## 2018-05-03 MED ORDER — IOHEXOL 350 MG/ML SOLN
100.0000 mL | Freq: Once | INTRAVENOUS | Status: AC | PRN
  Administered 2018-05-03: 100 mL via INTRAVENOUS

## 2018-05-03 MED ORDER — ROSUVASTATIN CALCIUM 5 MG PO TABS: 5.0000 mg | ORAL_TABLET | Freq: Every day | ORAL | 0 refills | Status: DC

## 2018-05-03 MED ORDER — FAMOTIDINE 20 MG PO TABS
20.0000 mg | ORAL_TABLET | Freq: Every day | ORAL | Status: DC
  Administered 2018-05-03: 20 mg via ORAL
  Filled 2018-05-03: qty 1

## 2018-05-03 NOTE — Progress Notes (Signed)
Patient ambulated approx 500 ft without assistive device, steady gait; no increase in chest pain at all; no SOB.  Patient has not been given Naprosyn yet d/t not available from pharmacy yet.  Will administer Crestor and Naprosyn when available.  Cardiology recommends discharge home if not having chest pain w/activity.  Will contact Family Medicine for potential discharge.

## 2018-05-03 NOTE — Progress Notes (Signed)
  Echocardiogram 2D Echocardiogram has been performed.  A limited echocardiogram was performed per the Director's protocol to limit patient to technician exposure during COVID 19.  Belva Chimes 05/03/2018, 8:35 AM

## 2018-05-03 NOTE — Discharge Summary (Signed)
Family Medicine [facility] Discharge Summary  Patient name: Vanessa Melton Medical record number: [id number] Date of birth: 1932-03-18 Age: 85 y.o. Gender: female Date of Admission: 05/03/2018  Date of Discharge: 05/02/3028 Admitting Physician: Westley Chandler, MD  Primary Care Provider: Nestor Ramp, MD Consultants: cardiology  Indication for Hospitalization: chest pain   Discharge Diagnoses/Problem List:  Chest pain HTN Hypothyroidism Chronic migraines anxiety  Disposition: home  Discharge Condition: improved, stable  Discharge Exam:  BP 129/66 (BP Location: Right Arm)   Pulse 62   Temp 97.6 F (36.4 C) (Oral)   Resp 16   Ht 5\' 2"  (1.575 m)   Wt 69.8 kg   SpO2 93%   BMI 28.15 kg/m   Gen: alert and oriented.  No acute distress. Sitting up in her bedside chair.  CV: Regular rhythm. Normal rate. No murmurs. Mildly TTP on sternum. No pitting edema.  Pulm: Lungs clear to auscultation bilaterally.  No wheezes. No crackles.  Abd: soft, nontender.  Normal bowel sounds.   Brief Hospital Course:  Patient came to the ED after being awoken from sleep with sharp 10/10 midsternal chest pain radiating to her back.  In the ED she was given nitroglycerin with no relief of symptoms.  EKG did not show ST changes, troponins were negative, as were CBC/BMP, and a CTA to rule out dissection.  Echo was ordered in the AM, which was also without significant findings.  Cardiology was consulted, who stated it was likely musculoskeletal pain given the above findings. Her chest pain is also very suspicious for acid reflux/dyspepsia given the timing of it and her previous history with reflux.  She was sent home with a prescription for famotidine.  Cardiology gave her aleve and told her to schedule an outpatient follow up if pain did not improve with NSAID use in the next 2 days.    Issues for Follow Up:  1. Chest pain - likely msk/GI. Cardiology asked pt to follow up if pain did not  improve after a few days.  Patient will likely need a cardiac stress test at some point on an outpatient basis.   2. reflux- patient was taking sucralfate before admission and has been off and on this medication for several years on discharge patient was sent home with pepcid.  Last known EGD was in 2016 which showed 'minimal esophagitis' at the time. Consider a repeat EGD in the future if patient's symptoms do not improve with pepcid.    Significant Procedures: none  Significant Labs and Imaging:  Recent Labs  Lab 05/03/18 0247  WBC 8.1  HGB 13.8  HCT 43.6  PLT 167   Recent Labs  Lab 05/03/18 0247  NA 139  K 3.8  CL 107  CO2 22  GLUCOSE 106*  BUN 13  CREATININE 0.77  CALCIUM 10.1      Results/Tests Pending at Time of Discharge: none  Discharge Medications:  Allergies as of 05/03/2018      Reactions   Beta Adrenergic Blockers Shortness Of Breath, Other (See Comments)   Chest pain, dizziness   Candesartan Cilexetil-hctz    Dyspnea on exertion   Codeine Shortness Of Breath, Rash   Headache   Penicillins Shortness Of Breath, Rash   Headache Did it involve swelling of the face/tongue/throat, SOB, or low BP? Yes Did it involve sudden or severe rash/hives, skin peeling, or any reaction on the inside of your mouth or nose? No Did you need to seek medical attention at  a hospital or doctor's office? Unknown When did it last happen?childhood allergy If all above answers are "NO", may proceed with cephalosporin use.   Ace Inhibitors Cough   Combigan [brimonidine Tartrate-timolol]    Chest pain   Doxazosin    Unknown reaction   Other    Steroids - malaise      Medication List    STOP taking these medications   BC HEADACHE PO   diazepam 5 MG tablet Commonly known as:  VALIUM   predniSONE 50 MG tablet Commonly known as:  DELTASONE   sucralfate 1 GM/10ML suspension Commonly known as:  Carafate     TAKE these medications   ALPRAZolam 0.5 MG  tablet Commonly known as:  XANAX Take one by mouth one hour prior to public speaking prn What changed:    how much to take  how to take this  when to take this  reasons to take this  additional instructions   amLODipine 2.5 MG tablet Commonly known as:  NORVASC Take 1 tablet (2.5 mg total) by mouth daily. What changed:  when to take this   CALCIUM 1000 + D PO Take 2 tablets by mouth 3 (three) times daily.   cholecalciferol 25 MCG (1000 UT) tablet Commonly known as:  VITAMIN D3 Take 3,000 Units by mouth daily.   famotidine 20 MG tablet Commonly known as:  PEPCID Take 1 tablet (20 mg total) by mouth daily. Start taking on:  May 04, 2018   ICAPS AREDS 2 PO Take 1 tablet by mouth 2 (two) times daily.   levothyroxine 75 MCG tablet Commonly known as:  SYNTHROID, LEVOTHROID Take 1 tablet (75 mcg total) by mouth daily.   OVER THE COUNTER MEDICATION Apply 1 application topically daily as needed (pain). CBD cream   rizatriptan 10 MG tablet Commonly known as:  Maxalt Take 1 tablet (10 mg total) by mouth as needed for migraine. May repeat in 2 hours if needed   rosuvastatin 5 MG tablet Commonly known as:  CRESTOR Take 1 tablet (5 mg total) by mouth daily at 6 PM.   Vitamin D (Ergocalciferol) 1.25 MG (50000 UT) Caps capsule Commonly known as:  DRISDOL Take 1 capsule (50,000 Units total) by mouth every 7 (seven) days.       Discharge Instructions: Please refer to Patient Instructions section of EMR for full details.  Patient was counseled important signs and symptoms that should prompt return to medical care, changes in medications, dietary instructions, activity restrictions, and follow up appointments.   Follow-Up Appointments: Follow-up Information    Nestor Ramp, MD. Call in 1 week(s).   Specialties:  Family Medicine, Sports Medicine Why:  please call to make a follow up appointment.  This will likely be a telemedicine visit, but please call ahead to schedule a  time.   Contact information: [address] [place] [address] [phone]           Sandre Kitty, MD 05/03/2018, 4:36 PM PGY-1, [facility]

## 2018-05-03 NOTE — ED Notes (Signed)
ED TO INPATIENT HANDOFF REPORT  ED Nurse Name and Phone #:  Erin Fulling [phone]  S Name/Age/Gender Vanessa Melton 85 y.o. female Room/Bed: 029C/029C  Code Status   Code Status: Full Code  Home/SNF/Other Home Patient oriented to: self, place, time and situation Is this baseline? Yes   Triage Complete: Triage complete  Chief Complaint chest pain  Triage Note Per pt she woke up this morning out of her sleep with chest pain in the center that radiates to her back. Also having sob with nausea. No weakness.    Allergies Allergies  Allergen Reactions  . Beta Adrenergic Blockers Shortness Of Breath and Other (See Comments)    Chest pain, dizziness  . Candesartan Cilexetil-Hctz     Dyspnea on exertion  . Codeine Shortness Of Breath and Rash    Headache  . Penicillins Shortness Of Breath and Rash    Headache Did it involve swelling of the face/tongue/throat, SOB, or low BP? Yes Did it involve sudden or severe rash/hives, skin peeling, or any reaction on the inside of your mouth or nose? No Did you need to seek medical attention at a hospital or doctor's office? Unknown When did it last happen?childhood allergy If all above answers are "NO", may proceed with cephalosporin use.   . Ace Inhibitors Cough  . Combigan [Brimonidine Tartrate-Timolol]     Chest pain  . Doxazosin     Unknown reaction  . Other     Steroids - malaise    Level of Care/Admitting Diagnosis ED Disposition    ED Disposition Condition Comment   Admit  Hospital Area: [facility] [[id number]]  Level of Care: Progressive [102]  Diagnosis: Chest pain [659935]  Admitting Physician: Westley Chandler [[id number]]  Attending Physician: Manson Passey, CARINA Judie Petit [[id number]]  PT Class (Do Not Modify): Observation [104]  PT Acc Code (Do Not Modify): Observation [10022]       B Medical/Surgery History Past Medical History:  Diagnosis Date  . Allergy   . Arthritis   . Cataract   . GERD  (gastroesophageal reflux disease)   . Hypertension   . Hypothyroidism    Past Surgical History:  Procedure Laterality Date  . CATARACT EXTRACTION W/ INTRAOCULAR LENS IMPLANT    . COSMETIC SURGERY    . EYE MUSCLE SURGERY    . EYE SURGERY    . FRACTURE SURGERY       A IV Location/Drains/Wounds Patient Lines/Drains/Airways Status   Active Line/Drains/Airways    Name:   Placement date:   Placement time:   Site:   Days:   Peripheral IV 05/03/18 Left Antecubital   05/03/18    0241    Antecubital   less than 1          Intake/Output Last 24 hours No intake or output data in the 24 hours ending 05/03/18 0616  Labs/Imaging Results for orders placed or performed during the hospital encounter of 05/03/18 (from the past 48 hour(s))  Basic metabolic panel     Status: Abnormal   Collection Time: 05/03/18  2:47 AM  Result Value Ref Range   Sodium 139 135 - 145 mmol/L   Potassium 3.8 3.5 - 5.1 mmol/L   Chloride 107 98 - 111 mmol/L   CO2 22 22 - 32 mmol/L   Glucose, Bld 106 (H) 70 - 99 mg/dL   BUN 13 8 - 23 mg/dL   Creatinine, Ser 3.30 0.44 - 1.00 mg/dL   Calcium 07.6 8.9 - 22.6 mg/dL  GFR calc non Af Amer >60 >60 mL/min   GFR calc Af Amer >60 >60 mL/min   Anion gap 10 5 - 15    Comment: Performed at [facility], [address] [place], [place] [address]  CBC     Status: None   Collection Time: 05/03/18  2:47 AM  Result Value Ref Range   WBC 8.1 4.0 - 10.5 K/uL   RBC 4.80 3.87 - 5.11 MIL/uL   Hemoglobin 13.8 12.0 - 15.0 g/dL   HCT 60.4 54.0 - 98.1 %   MCV 90.8 80.0 - 100.0 fL   MCH 28.8 26.0 - 34.0 pg   MCHC 31.7 30.0 - 36.0 g/dL   RDW 19.1 47.8 - 29.5 %   Platelets 167 150 - 400 K/uL   nRBC 0.0 0.0 - 0.2 %    Comment: Performed at [facility], [address] [place], [place] [address]  Troponin I - ONCE - STAT     Status: None   Collection Time: 05/03/18  2:47 AM  Result Value Ref Range   Troponin I <0.03 <0.03 ng/mL    Comment: Performed at [facility], [address], [place], [place] [address]   Dg Chest Portable 1 View  Result Date: 05/03/2018 CLINICAL DATA:  Initial evaluation for acute chest pain, shortness of breath. EXAM: PORTABLE CHEST 1 VIEW COMPARISON:  Prior radiograph from 08/11/2014. FINDINGS: Transverse heart size within normal limits. Mediastinal silhouette normal. Aortic atherosclerosis. Lungs mildly hypoinflated. Associated mild subsegmental left basilar atelectasis. No focal infiltrates. No pulmonary edema or pleural effusion. No pneumothorax. No acute osseous finding.  Osteopenia. IMPRESSION: 1. Low lung volumes with associated mild left basilar subsegmental atelectasis. 2. No other active cardiopulmonary disease. Electronically Signed   By: Rise Mu M.D.   On: 05/03/2018 03:13   Ct Angio Chest/abd/pel For Dissection W And/or Wo Contrast  Result Date: 05/03/2018 CLINICAL DATA:  Chest pain radiating to the back with shortness of breath EXAM: CT ANGIOGRAPHY CHEST, ABDOMEN AND PELVIS TECHNIQUE: Multidetector CT imaging through the chest, abdomen and pelvis was performed using the standard protocol during bolus administration of intravenous contrast. Multiplanar reconstructed images and MIPs were obtained and reviewed to evaluate the vascular anatomy. CONTRAST:  OMNIPAQUE IOHEXOL 350 MG/ML SOLN COMPARISON:  None. FINDINGS: CTA CHEST FINDINGS Cardiovascular: No intramural aortic hematoma. Mild for age atherosclerotic plaque of the aorta without dissection or aneurysm. Normal heart size. No pulmonary artery filling defect. No pericardial effusion. Mediastinum/Nodes: Negative for adenopathy. Lungs/Pleura: Mild mosaic attenuation of the lungs. There is no edema, consolidation, effusion, or pneumothorax. Musculoskeletal: No acute or aggressive finding. Bilateral glenohumeral osteoarthritis Review of the MIP images confirms the above findings. CTA ABDOMEN AND PELVIS FINDINGS VASCULAR Aorta: Atherosclerotic plaque.  No  dissection or aneurysm. Celiac: Widely patent with smooth branches SMA: Smooth and widely patent. Renals: Atheromatous plaque at the bilateral ostia without stenosis. No dissection or aneurysm. IMA: Patent Inflow: Patent Veins: Unremarkable in the arterial phase Review of the MIP images confirms the above findings. NON-VASCULAR Hepatobiliary: No focal liver abnormality.No evidence of biliary obstruction or stone. Pancreas: Unremarkable. Spleen: Unremarkable. Adrenals/Urinary Tract: Negative adrenals. No hydronephrosis or stone. Unremarkable bladder. Stomach/Bowel:  No obstruction. Minimal colonic diverticulosis. Lymphatic: No mass or adenopathy. Reproductive:No pathologic findings. Other: No ascites or pneumoperitoneum. Musculoskeletal: No acute abnormalities. Review of the MIP images confirms the above findings. IMPRESSION: 1. No evidence of acute aortic syndrome. No specific explanation for pain. 2. Mild for age  atherosclerosis. Electronically Signed   By: Marnee SpringJonathon  Watts M.D.   On: 05/03/2018 04:31    Pending Labs Unresulted Labs (From admission, onward)    Start     Ordered   05/03/18 0601  Troponin I - Now Then Q6H  Now then every 6 hours,   STAT     05/03/18 0601   05/03/18 0601  TSH  Once,   R     05/03/18 0601          Vitals/Pain Today's Vitals   05/03/18 0315 05/03/18 0424 05/03/18 0430 05/03/18 0515  BP: 121/65  (!) 116/57 129/74  Pulse: 76  (!) 55 60  Resp: 20  10 18   Temp:      TempSrc:      SpO2: 93%  100% 98%  Weight:      Height:      PainSc:  7       Isolation Precautions No active isolations  Medications Medications  SUMAtriptan (IMITREX) tablet 50 mg (has no administration in time range)  amLODipine (NORVASC) tablet 2.5 mg (has no administration in time range)  levothyroxine (SYNTHROID, LEVOTHROID) tablet 75 mcg (has no administration in time range)  aspirin chewable tablet 324 mg (has no administration in time range)    Or  aspirin suppository 300 mg (has no  administration in time range)  aspirin EC tablet 81 mg (has no administration in time range)  nitroGLYCERIN (NITROSTAT) SL tablet 0.4 mg (has no administration in time range)  acetaminophen (TYLENOL) tablet 650 mg (has no administration in time range)  ondansetron (ZOFRAN) injection 4 mg (has no administration in time range)  enoxaparin (LOVENOX) injection 40 mg (has no administration in time range)  alum & mag hydroxide-simeth (MAALOX/MYLANTA) 200-200-20 MG/5ML suspension 30 mL (has no administration in time range)    And  lidocaine (XYLOCAINE) 2 % viscous mouth solution 15 mL (has no administration in time range)  sodium chloride flush (NS) 0.9 % injection 3 mL (3 mLs Intravenous Given 05/03/18 0246)  aspirin chewable tablet 324 mg (324 mg Oral Given 05/03/18 0244)  nitroGLYCERIN (NITROSTAT) SL tablet 0.4 mg (0.4 mg Sublingual Given 05/03/18 0356)  ondansetron (ZOFRAN) injection 4 mg (4 mg Intravenous Given 05/03/18 0244)  fentaNYL (SUBLIMAZE) injection 50 mcg (50 mcg Intravenous Given 05/03/18 0425)  iohexol (OMNIPAQUE) 350 MG/ML injection 100 mL (100 mLs Intravenous Contrast Given 05/03/18 0411)    Mobility walks Low fall risk   Focused Assessments Cardiac Assessment Handoff:  Cardiac Rhythm: Normal sinus rhythm Lab Results  Component Value Date   CKTOTAL 50 06/23/2009   CKMB 1.0 06/23/2009   TROPONINI <0.03 05/03/2018   Lab Results  Component Value Date   DDIMER  06/22/2009    0.33        AT THE INHOUSE ESTABLISHED CUTOFF VALUE OF 0.48 ug/mL FEU, THIS ASSAY HAS BEEN DOCUMENTED IN THE LITERATURE TO HAVE A SENSITIVITY AND NEGATIVE PREDICTIVE VALUE OF AT LEAST 98 TO 99%.  THE TEST RESULT SHOULD BE CORRELATED WITH AN ASSESSMENT OF THE CLINICAL PROBABILITY OF DVT / VTE.   Does the Patient currently have chest pain? Yes     R Recommendations: See Admitting Provider Note  Report given to:   Additional Notes:

## 2018-05-03 NOTE — H&P (Addendum)
[facility] Admission History and Physical Service Pager: [phone]  Patient name: Vanessa Melton Medical record number: [id number] Date of birth: 1932-03-27 Age: 85 y.o. Gender: female  Primary Care Provider: Nestor RampNeal, Sara L, MD Consultants: none Code Status: Full  Chief Complaint: Chest pain  Assessment and Plan: Vanessa Melton is a 85 y.o. female presenting with atypical chest pain.  Her past medical history is significant for hypertension, recent hip fracture, hypothyroidism, chronic shoulder pain.  Atypical chest pain She reports waking in the night suddenly at 1230 with severe chest pain.  The pain was a strong pressure in a bandlike distribution around both sides of her chest in addition to moving straight through her chest to her back at the level of her shoulder blades and moving upward to her neck.  Pain was not relieved with BC powder, heating pads, CBD oil and showed minimal improvement with nitroglycerin x3 in the ED.Her vitals on admission were notable for hypertension 166/79.  Her physical exam was notable for some chest pressure reproduced on palpation of the sternum and tenderness over the midline T5 vertebra.  Admission labs were largely unremarkable with an initial troponin of under 0.03.  Initial EKG showed no T wave inversions ST changes or Q waves.  CT angios chest/abdomen/pelvis showed no evidence of aortic dissection, pneumonia, effusion, rib fractures, aneurysm.  The differential for her chest pain at this time includes: Gastrointestinal etiology from GERD or esophageal spasm, cardiac etiology, musculoskeletal origin from rib vertebrae injury or muscle spasm. Pain is certainly atypical for cardiac etiology at this time.  -Admit to stepdown, attending Dr. Manson PasseyBrown -Cardiac monitors -Trend troponins -Repeat EKG -Echo -GI cocktail -Nitroglycerin as needed -Zofran PRN -Consider thoracic imaging -Monitor vitals  Hypertension, chronic Her  home medication includes amlodipine 2.5 mg daily.  Blood pressure on admission was elevated to 166/79. -Continue amlodipine 2.5 mg daily, titrate as needed   Hypothyroidism chronic She has history of hypothyroidism last TSH on 6-20 19 was 0.28. -Follow-up TSH -Continue Synthroid 75 mcg daily  Migraines, chronic She is a history of migraines.  Headaches noted on interview likely secondary to nitroglycerin.  No migraines noted today. -Sumatriptan 50 mg as needed  Anxiety- largely related to public speaking, patient denies feeling anxious prior to this event -follow up outpatient   FEN/GI: General diet Prophylaxis: Lovenox  Disposition: Possible discharge 3/31 pending negative cardiac work up  History of Present Illness:  Vanessa Melton is a 85 y.o. female presenting with atypical chest pain.  Her past medical history is significant for hypertension, recent hip fracture, hypothyroidism, chronic shoulder pain.  She reports that she was awakened at 12:30 AM with severe chest pain.  She described the chest pain as a 10/10 bandlike pressure around her chest.  She also felt an intense chest pressure moving straight through her chest to her shoulder blades and moving up toward her neck.  She did not note any change in chest pain when she change position from lying down to sitting up.  She did not notice any difference with exertion.  She was not able to sleep and [facility] and a heating pad which made no significant improvement so she then tried applying CBD oil with no significant improvement.  At that time she checked her blood pressure at home which was 165/90.  Combination of her chest pain and high blood pressure made her concerned enough to come to the hospital.  She has had chest pain  before related to adverse effects of medication which felt significantly different from this episode.  On review of systems, she noted nausea and mild shortness of breath that was associated  with her chest pain.  She has a chronic cough with no recent changes, she denied fever.  In the ED, initial EKG was unremarkable.  Troponin is under 0.03 she was admitted for ACS rule out.  Review Of Systems: Per HPI with the following additions:   Review of Systems  Constitutional: Negative for fever and malaise/fatigue.  HENT: Negative for congestion, hearing loss and sore throat.   Eyes: Negative for blurred vision.  Respiratory: Positive for cough (chronic, no changes, no sputum) and shortness of breath.   Cardiovascular: Positive for chest pain.  Gastrointestinal: Positive for nausea. Negative for abdominal pain, constipation and diarrhea.  Genitourinary: Negative for dysuria.  Musculoskeletal: Positive for falls (most recent fall was 01/2019).  Neurological: Negative for tremors.    Patient Active Problem List   Diagnosis Date Noted  . Chest pain 05/03/2018  . Greater trochanteric bursitis of right hip 01/25/2018  . Right wrist injury 01/07/2018  . Chronic right shoulder pain 04/24/2017  . Constipation 07/25/2016  . Well adult health check 05/22/2016  . Fear of public speaking 04/26/2015  . silent GERD, presented with cough 03/14/2015  . Right knee pain 01/03/2015  . Essential hypertension 09/23/2011  . Hypothyroidism 09/23/2011  . Migraine headache 09/23/2011  . Glaucoma 09/23/2011    Past Medical History: Past Medical History:  Diagnosis Date  . Allergy   . Arthritis   . Cataract   . GERD (gastroesophageal reflux disease)   . Hypertension   . Hypothyroidism     Past Surgical History: Past Surgical History:  Procedure Laterality Date  . CATARACT EXTRACTION W/ INTRAOCULAR LENS IMPLANT    . COSMETIC SURGERY    . EYE MUSCLE SURGERY    . EYE SURGERY    . FRACTURE SURGERY      Social History: Social History   Tobacco Use  . Smoking status: Former Smoker    Years: 5.00    Types: Cigarettes    Last attempt to quit: 02/03/1957    Years since quitting: 61.2   . Smokeless tobacco: Never Used  . Tobacco comment: no plan to start again  Substance Use Topics  . Alcohol use: Yes    Alcohol/week: 7.0 standard drinks    Types: 7 Glasses of wine per week    Comment: social  . Drug use: No   Additional social history: lives with significant other, feels safe. Manages all ADLs at baseline  Family History: Family History  Problem Relation Age of Onset  . Colon cancer Mother   . Emphysema Father   . Aneurysm Brother   . Stroke Brother   . Colon polyps Daughter   . Colon cancer Brother    Allergies and Medications: Allergies  Allergen Reactions  . Beta Adrenergic Blockers Shortness Of Breath and Other (See Comments)    Chest pain, dizziness  . Candesartan Cilexetil-Hctz     Dyspnea on exertion  . Codeine Shortness Of Breath and Rash    Headache  . Penicillins Shortness Of Breath and Rash    Headache Did it involve swelling of the face/tongue/throat, SOB, or low BP? Yes Did it involve sudden or severe rash/hives, skin peeling, or any reaction on the inside of your mouth or nose? No Did you need to seek medical attention at a hospital or doctor's office? Unknown When  did it last happen?childhood allergy If all above answers are "NO", may proceed with cephalosporin use.   . Ace Inhibitors Cough  . Combigan [Brimonidine Tartrate-Timolol]     Chest pain  . Doxazosin     Unknown reaction  . Other     Steroids - malaise   No current facility-administered medications on file prior to encounter.    Current Outpatient Medications on File Prior to Encounter  Medication Sig Dispense Refill  . ALPRAZolam (XANAX) 0.5 MG tablet Take one by mouth one hour prior to public speaking prn (Patient taking differently: Take 0.5 mg by mouth daily as needed (one hour prior to public speaking). ) 30 tablet 1  . amLODipine (NORVASC) 2.5 MG tablet Take 1 tablet (2.5 mg total) by mouth daily. (Patient taking differently: Take 2.5 mg by mouth at bedtime.  ) 90 tablet 3  . Aspirin-Salicylamide-Caffeine (BC HEADACHE PO) Take 1 packet by mouth daily as needed (pain).    . Calcium Carb-Cholecalciferol (CALCIUM 1000 + D PO) Take 2 tablets by mouth 3 (three) times daily.    . cholecalciferol (VITAMIN D3) 25 MCG (1000 UT) tablet Take 3,000 Units by mouth daily.     Marland Kitchen levothyroxine (SYNTHROID, LEVOTHROID) 75 MCG tablet Take 1 tablet (75 mcg total) by mouth daily. 90 tablet 3  . Multiple Vitamins-Minerals (ICAPS AREDS 2 PO) Take 1 tablet by mouth 2 (two) times daily.    Marland Kitchen OVER THE COUNTER MEDICATION Apply 1 application topically daily as needed (pain). CBD cream    . rizatriptan (MAXALT) 10 MG tablet Take 1 tablet (10 mg total) by mouth as needed for migraine. May repeat in 2 hours if needed 12 tablet 3  . sucralfate (CARAFATE) 1 GM/10ML suspension TAKE 10 MLS BY MOUTH FOUR TIMES DAILY WITH MEALS AND AT BEDTIME (Patient taking differently: Take 1 g by mouth 4 (four) times daily as needed (acid reflux). ) 1260 mL 3  . diazepam (VALIUM) 5 MG tablet One tab by mouth, 2 hours before procedure. (Patient not taking: Reported on 03/10/2018) 2 tablet 0  . predniSONE (DELTASONE) 50 MG tablet Take 1 tablet (50 mg total) by mouth daily. (Patient not taking: Reported on 03/10/2018) 5 tablet 0  . Vitamin D, Ergocalciferol, (DRISDOL) 1.25 MG (50000 UT) CAPS capsule Take 1 capsule (50,000 Units total) by mouth every 7 (seven) days. (Patient not taking: Reported on 03/10/2018) 12 capsule 0    Objective: BP 121/65   Pulse 76   Temp 98.6 F (37 C) (Oral)   Resp 20   Ht  (1.575 m)   Wt 68 kg   SpO2 93%   BMI 27.44 kg/m   Physical Exam Constitutional:      General: She is not in acute distress.    Appearance: She is well-developed and normal weight. She is not ill-appearing.  Neck:     Vascular: No JVD.  Cardiovascular:     Rate and Rhythm: Normal rate and regular rhythm.     Pulses:          Radial pulses are 2+ on the right side.     Heart sounds: Murmur  present. Systolic murmur present with a grade of 2/6. No friction rub. No gallop.   Pulmonary:     Effort: Pulmonary effort is normal.     Breath sounds: Normal breath sounds.  Chest:     Chest wall: Tenderness present.     Comments: Tenderness to palpation around the level of the fifth thoracic  vertebra.  No paraspinal tenderness.  Reproduction of mild chest pressure (no reproduction of bandlike sensation) with palpation of the sternum. Abdominal:     General: Bowel sounds are normal.     Palpations: Abdomen is soft.  Musculoskeletal:     Right lower leg: Edema present.     Left lower leg: Edema present.     Comments: Chronic lower extremity edema, no changes.  Lymphadenopathy:     Cervical: No cervical adenopathy.  Skin:    General: Skin is warm and dry.  Neurological:     General: No focal deficit present.     Mental Status: She is alert.  Psychiatric:        Mood and Affect: Mood normal.        Behavior: Behavior normal.      Labs and Imaging: CBC BMET  Recent Labs  Lab 05/03/18 0247  WBC 8.1  HGB 13.8  HCT 43.6  PLT 167   Recent Labs  Lab 05/03/18 0247  NA 139  K 3.8  CL 107  CO2 22  BUN 13  CREATININE 0.77  GLUCOSE 106*  CALCIUM 10.1     Dg Chest Portable 1 View  Result Date: 05/03/2018 CLINICAL DATA:  Initial evaluation for acute chest pain, shortness of breath. EXAM: PORTABLE CHEST 1 VIEW COMPARISON:  Prior radiograph from 08/11/2014. FINDINGS: Transverse heart size within normal limits. Mediastinal silhouette normal. Aortic atherosclerosis. Lungs mildly hypoinflated. Associated mild subsegmental left basilar atelectasis. No focal infiltrates. No pulmonary edema or pleural effusion. No pneumothorax. No acute osseous finding.  Osteopenia. IMPRESSION: 1. Low lung volumes with associated mild left basilar subsegmental atelectasis. 2. No other active cardiopulmonary disease. Electronically Signed   By: Rise Mu M.D.   On: 05/03/2018 03:13   Ct  Angio Chest/abd/pel For Dissection W And/or Wo Contrast  Result Date: 05/03/2018 CLINICAL DATA:  Chest pain radiating to the back with shortness of breath EXAM: CT ANGIOGRAPHY CHEST, ABDOMEN AND PELVIS TECHNIQUE: Multidetector CT imaging through the chest, abdomen and pelvis was performed using the standard protocol during bolus administration of intravenous contrast. Multiplanar reconstructed images and MIPs were obtained and reviewed to evaluate the vascular anatomy. CONTRAST:  OMNIPAQUE IOHEXOL 350 MG/ML SOLN COMPARISON:  None. FINDINGS: CTA CHEST FINDINGS Cardiovascular: No intramural aortic hematoma. Mild for age atherosclerotic plaque of the aorta without dissection or aneurysm. Normal heart size. No pulmonary artery filling defect. No pericardial effusion. Mediastinum/Nodes: Negative for adenopathy. Lungs/Pleura: Mild mosaic attenuation of the lungs. There is no edema, consolidation, effusion, or pneumothorax. Musculoskeletal: No acute or aggressive finding. Bilateral glenohumeral osteoarthritis Review of the MIP images confirms the above findings. CTA ABDOMEN AND PELVIS FINDINGS VASCULAR Aorta: Atherosclerotic plaque.  No dissection or aneurysm. Celiac: Widely patent with smooth branches SMA: Smooth and widely patent. Renals: Atheromatous plaque at the bilateral ostia without stenosis. No dissection or aneurysm. IMA: Patent Inflow: Patent Veins: Unremarkable in the arterial phase Review of the MIP images confirms the above findings. NON-VASCULAR Hepatobiliary: No focal liver abnormality.No evidence of biliary obstruction or stone. Pancreas: Unremarkable. Spleen: Unremarkable. Adrenals/Urinary Tract: Negative adrenals. No hydronephrosis or stone. Unremarkable bladder. Stomach/Bowel:  No obstruction. Minimal colonic diverticulosis. Lymphatic: No mass or adenopathy. Reproductive:No pathologic findings. Other: No ascites or pneumoperitoneum. Musculoskeletal: No acute abnormalities. Review of the MIP  images confirms the above findings. IMPRESSION: 1. No evidence of acute aortic syndrome. No specific explanation for pain. 2. Mild for age atherosclerosis. Electronically Signed   By: Kathrynn Ducking.D.  On: 05/03/2018 04:31     Mirian Mo, MD 05/03/2018, 5:01 AM PGY-1, [facility] FPTS Intern pager: [phone], text pages welcome   FPTS Upper-Level Resident Addendum   I have independently interviewed and examined the patient. I have discussed the above with the original author and agree with their documentation. My edits for correction/addition/clarification are in pink. Please see also any attending notes.    Dolores Patty, DO PGY-3, [facility] FPTS Service pager: [phone] (text pages welcome through [employer])

## 2018-05-03 NOTE — Consult Note (Addendum)
CARDIOLOGY CONSULT NOTE  Patient ID: Vanessa Melton MRN: [id number] DOB/AGE: 1932-10-06 85 y.o.  Admit date: 05/03/2018 Referring Physician  Lenor Coffin, MD Primary Physician:  Nestor Ramp, MD Reason for Consultation  Chest pain  HPI: Vanessa Melton  is a 85 y.o. female  With hypertension, hyperglycemia, otherwise no significant prior cardiovascular history admitted to the hospital with midsternal chest tightness with radiation to the back that started and woke her up at 12 midnight.  Chest pain persisted for several hours, due to worsening symptoms in spite of taking aspirin and also heating pad, she presented to the emergency room and admitted for further evaluation.  States that she did receive sublingual nitroglycerin in the emergency room without any change in chest pain.  She did receive fentanyl and stated that it did not seem to help her much.  This morning the chest discomfort has eased off and now states that it is from 8 to the present 1-2 intensity, states that it gets worse when she takes a deep breath and is located in the middle of the chest and also in the center of the back.  No hemoptysis, no leg edema, no recent travel.  She is a non-smoker, does not drink alcohol.  Remains fairly active.  Past Medical History:  Diagnosis Date  . Allergy   . Aortic atherosclerosis (HCC) 05/03/2018  . Arthritis   . Cataract   . GERD (gastroesophageal reflux disease)   . Hypertension   . Hypothyroidism   . Mitral valve prolapse 05/03/2018     Past Surgical History:  Procedure Laterality Date  . CATARACT EXTRACTION W/ INTRAOCULAR LENS IMPLANT    . COSMETIC SURGERY    . EYE MUSCLE SURGERY    . EYE SURGERY    . FRACTURE SURGERY       Family History  Problem Relation Age of Onset  . Colon cancer Mother   . Emphysema Father   . Aneurysm Brother   . Stroke Brother   . Colon polyps Daughter   . Colon cancer Brother      Social History: Social History   Socioeconomic  History  . Marital status: Significant Other    Spouse name: Not on file  . Number of children: 3  . Years of education: 78  . Highest education level: Doctorate  Occupational History  . Occupation: retired professor    Comment: [employer]  Social Needs  . Financial resource strain: Not hard at all  . Food insecurity:    Worry: Never true    Inability: Never true  . Transportation needs:    Medical: No    Non-medical: No  Tobacco Use  . Smoking status: Former Smoker    Years: 5.00    Types: Cigarettes    Last attempt to quit: 02/03/1957    Years since quitting: 61.2  . Smokeless tobacco: Never Used  . Tobacco comment: no plan to start again  Substance and Sexual Activity  . Alcohol use: Yes    Alcohol/week: 7.0 standard drinks    Types: 7 Glasses of wine per week    Comment: social  . Drug use: No  . Sexual activity: Yes    Birth control/protection: None  Lifestyle  . Physical activity:    Days per week: 5 days    Minutes per session: 90 min  . Stress: Not at all  Relationships  . Social connections:    Talks on phone: More than three times a week  Gets together: More than three times a week    Attends religious service: More than 4 times per year    Active member of club or organization: Yes    Attends meetings of clubs or organizations: More than 4 times per year    Relationship status: Living with partner  . Intimate partner violence:    Fear of current or ex partner: No    Emotionally abused: No    Physically abused: No    Forced sexual activity: No  Other Topics Concern  . Not on file  Social History Narrative   Significant other: Dr, [name]   Home is split level, 6 stairs. Handrails on stairs, has smoke alarms and fire extinguisher.   Exercises 5+ days a week 1-1.5 hours per day   Golfs   Shows horses--Arabian female named BOGO (after buying the mare she got him free--buy one get one=BOGO)   All [address] [place]   Has an  adopted Medical laboratory scientific officer.   Travels frequently   Wears seat belts in vehicles.      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 ALPRAZolam (XANAX) 0.5 MG tablet Take one by mouth one hour prior to public speaking prn (Patient taking differently: Take 0.5 mg by mouth daily as needed (one hour prior to public speaking). ) 30 tablet 1 unk  . amLODipine (NORVASC) 2.5 MG tablet Take 1 tablet (2.5 mg total) by mouth daily. (Patient taking differently: Take 2.5 mg by mouth at bedtime. ) 90 tablet 3 05/02/2018 at Unknown time  . Aspirin-Salicylamide-Caffeine (BC HEADACHE PO) Take 1 packet by mouth daily as needed (pain).   unk  . Calcium Carb-Cholecalciferol (CALCIUM 1000 + D PO) Take 2 tablets by mouth 3 (three) times daily.   05/02/2018 at Unknown time  . cholecalciferol (VITAMIN D3) 25 MCG (1000 UT) tablet Take 3,000 Units by mouth daily.    05/02/2018 at Unknown time  . levothyroxine (SYNTHROID, LEVOTHROID) 75 MCG tablet Take 1 tablet (75 mcg total) by mouth daily. 90 tablet 3 05/02/2018 at Unknown time  . Multiple Vitamins-Minerals (ICAPS AREDS 2 PO) Take 1 tablet by mouth 2 (two) times daily.   05/02/2018 at Unknown time  . OVER THE COUNTER MEDICATION Apply 1 application topically daily as needed (pain). CBD cream   05/02/2018 at Unknown time  . rizatriptan (MAXALT) 10 MG tablet Take 1 tablet (10 mg total) by mouth as needed for migraine. May repeat in 2 hours if needed 12 tablet 3 unk  . sucralfate (CARAFATE) 1 GM/10ML suspension TAKE 10 MLS BY MOUTH FOUR TIMES DAILY WITH MEALS AND AT BEDTIME (Patient taking differently: Take 1 g by mouth 4 (four) times daily as needed (acid reflux). ) 1260 mL 3 unk  . diazepam (VALIUM) 5 MG tablet One tab by mouth, 2 hours before procedure. (Patient not taking: Reported on 03/10/2018) 2 tablet 0 Not Taking at Unknown time  . predniSONE (DELTASONE) 50 MG tablet Take 1 tablet (50 mg total) by mouth daily.  (Patient not taking: Reported on 03/10/2018) 5 tablet 0 Not Taking at Unknown time  . Vitamin D, Ergocalciferol, (DRISDOL) 1.25 MG (50000 UT) CAPS capsule Take 1 capsule (50,000 Units total) by mouth every 7 (seven) days. (Patient not taking: Reported on 03/10/2018) 12 capsule 0 Not Taking at Unknown time    Review of Systems  Constitutional: Negative for malaise/fatigue and weight loss.  Respiratory: Positive for cough. Negative for hemoptysis, sputum production, shortness of breath and wheezing.   Cardiovascular: Positive for chest pain. Negative for palpitations, claudication and leg swelling.  Gastrointestinal: Positive for heartburn (occasional). Negative for abdominal pain, blood in stool, constipation and vomiting.  Genitourinary: Negative for dysuria.  Musculoskeletal: Negative for joint pain and myalgias.  Neurological: Negative for dizziness, focal weakness and headaches.  Endo/Heme/Allergies: Does not bruise/bleed easily.  Psychiatric/Behavioral: Negative for depression. The patient is not nervous/anxious.   All other systems reviewed and are negative.   Physical Exam: Blood pressure 129/66, pulse 62, temperature 97.6 F (36.4 C), temperature source Oral, resp. rate 16, height  (1.575 m), weight 69.8 kg, SpO2 93 %.  Physical Exam  Constitutional: No distress.  Moderately built and well-nourished  HENT:  Head: Atraumatic.  Eyes: Conjunctivae are normal.  Neck: Neck supple. No JVD present. No thyromegaly present.  Cardiovascular: Normal rate, regular rhythm and intact distal pulses. Exam reveals a midsystolic click. Exam reveals no gallop.  Murmur heard.  Crescendo-decrescendo mid to late systolic murmur is present with a grade of 2/6 at the apex. Pulmonary/Chest: Effort normal and breath sounds normal.  Abdominal: Soft. Bowel sounds are normal.  Musculoskeletal: Normal range of motion.        General: No edema.  Neurological: She is alert.  Skin: Skin is warm and dry.   Psychiatric: She has a normal mood and affect.    Labs:  BNP (last 3 results) No results for input(s): BNP in the last 8760 hours.  CMP Latest Ref Rng & Units 05/03/2018 07/29/2017 05/21/2016  Glucose 70 - 99 mg/dL 161(W) 92 91  BUN 8 - 23 mg/dL Creatinine 0.44 - 1.00 mg/dL 9.60 4.54 0.98  Sodium 135 - 145 mmol/L 139 136 140  Potassium 3.5 - 5.1 mmol/L 3.8 4.6 4.5  Chloride 98 - 111 mmol/L 107 98 101  CO2 22 - 32 mmol/L Calcium 8.9 - 10.3 mg/dL 11.9 14.7 9.9  Total Protein 6.0 - 8.5 g/dL - 6.3 6.5  Total Bilirubin 0.0 - 1.2 mg/dL - 0.5 0.4  Alkaline Phos 39 - 117 IU/L - 58 65  AST 0 - 40 IU/L - 21 17  ALT 0 - 32 IU/L - 17 15     CBC Latest Ref Rng & Units 05/03/2018 07/29/2017 09/23/2011  WBC 4.0 - 10.5 K/uL 8.1 6.1 7.7  Hemoglobin 12.0 - 15.0 g/dL 82.9 56.2 13.0  Hematocrit 36.0 - 46.0 % 43.6 35.6 42.5  Platelets 150 - 400 K/uL 167 201 -      Lipid Panel     Component Value Date/Time   CHOL 179 05/03/2018 0712   TRIG 52 05/03/2018 0712   HDL 65 05/03/2018 0712   CHOLHDL 2.8 05/03/2018 0712   VLDL 10 05/03/2018 0712   LDLCALC 104 (H) 05/03/2018 0712   LDLDIRECT 88 04/25/2015 0846   BNP (last 3 results) No results for input(s): BNP in the last 8760 hours. HEMOGLOBIN A1C 5.6%.   BMP Latest Ref Rng & Units 05/03/2018 07/29/2017 05/21/2016  Glucose 70 - 99 mg/dL 865(H) 92 91  BUN 8 - 23 mg/dL Creatinine 0.44 - 1.00 mg/dL 8.46 9.62 9.52  BUN/Creat Ratio 12 - 28 - 19 26  Sodium 135 - 145 mmol/L 139 136 140  Potassium 3.5 - 5.1 mmol/L 3.8 4.6 4.5  Chloride 98 - 111 mmol/L 107 98 101  CO2 22 - 32 mmol/L 22  25 22  Calcium 8.9 - 10.3 mg/dL 16.1 09.6 9.9     TSH Recent Labs    07/29/17 1155 05/03/18 0712  TSH 0.288* 3.051     Radiology: Dg Chest Portable 1 View  Result Date: 05/03/2018 CLINICAL DATA:  Initial evaluation for acute chest pain, shortness of breath. EXAM: PORTABLE CHEST 1 VIEW COMPARISON:  Prior radiograph from  08/11/2014. FINDINGS: Transverse heart size within normal limits. Mediastinal silhouette normal. Aortic atherosclerosis. Lungs mildly hypoinflated. Associated mild subsegmental left basilar atelectasis. No focal infiltrates. No pulmonary edema or pleural effusion. No pneumothorax. No acute osseous finding.  Osteopenia. IMPRESSION: 1. Low lung volumes with associated mild left basilar subsegmental atelectasis. 2. No other active cardiopulmonary disease. Electronically Signed   By: Rise Mu M.D.   On: 05/03/2018 03:13   Ct Angio Chest/abd/pel For Dissection W And/or Wo Contrast  Result Date: 05/03/2018 CLINICAL DATA:  Chest pain radiating to the back with shortness of breath EXAM: CT ANGIOGRAPHY CHEST, ABDOMEN AND PELVIS TECHNIQUE: Multidetector CT imaging through the chest, abdomen and pelvis was performed using the standard protocol during bolus administration of intravenous contrast. Multiplanar reconstructed images and MIPs were obtained and reviewed to evaluate the vascular anatomy. CONTRAST:  OMNIPAQUE IOHEXOL 350 MG/ML SOLN COMPARISON:  None. FINDINGS: CTA CHEST FINDINGS Cardiovascular: No intramural aortic hematoma. Mild for age atherosclerotic plaque of the aorta without dissection or aneurysm. Normal heart size. No pulmonary artery filling defect. No pericardial effusion. Mediastinum/Nodes: Negative for adenopathy. Lungs/Pleura: Mild mosaic attenuation of the lungs. There is no edema, consolidation, effusion, or pneumothorax. Musculoskeletal: No acute or aggressive finding. Bilateral glenohumeral osteoarthritis Review of the MIP images confirms the above findings. CTA ABDOMEN AND PELVIS FINDINGS VASCULAR Aorta: Atherosclerotic plaque.  No dissection or aneurysm. Celiac: Widely patent with smooth branches SMA: Smooth and widely patent. Renals: Atheromatous plaque at the bilateral ostia without stenosis. No dissection or aneurysm. IMA: Patent Inflow: Patent Veins: Unremarkable in the  arterial phase Review of the MIP images confirms the above findings. NON-VASCULAR Hepatobiliary: No focal liver abnormality.No evidence of biliary obstruction or stone. Pancreas: Unremarkable. Spleen: Unremarkable. Adrenals/Urinary Tract: Negative adrenals. No hydronephrosis or stone. Unremarkable bladder. Stomach/Bowel:  No obstruction. Minimal colonic diverticulosis. Lymphatic: No mass or adenopathy. Reproductive:No pathologic findings. Other: No ascites or pneumoperitoneum. Musculoskeletal: No acute abnormalities. Review of the MIP images confirms the above findings. IMPRESSION: 1. No evidence of acute aortic syndrome. No specific explanation for pain. 2. Mild for age atherosclerosis. Electronically Signed   By: Marnee Spring M.D.   On: 05/03/2018 04:31    Scheduled Meds: . alum & mag hydroxide-simeth  30 mL Oral Once   And  . lidocaine  15 mL Oral Once  . amLODipine  2.5 mg Oral QHS  . [START ON 05/04/2018] aspirin EC  81 mg Oral Daily  . enoxaparin (LOVENOX) injection  40 mg Subcutaneous Daily  . famotidine  20 mg Oral Daily  . levothyroxine  75 mcg Oral QAC breakfast  . naproxen  500 mg Oral BID WC   Continuous Infusions: PRN Meds:.acetaminophen, nitroGLYCERIN, ondansetron (ZOFRAN) IV  CARDIAC STUDIES:  EKG: 05/03/2018: Normal sinus rhythm without evidence of ischemia.  Normal QT interval.  Normal EKG.  Echocardiogram 05/03/2018: 1. Mild basal septal hypertrophy.  2. The right ventricle has normal systolc function. There is no increase in right ventricular wall thickness.  3. The mitral valve is degenerative. Moderate thickening of the mitral valve leaflet. Moderate calcification of the mitral valve leaflet. There is mild mitral  annular calcification present. Mitral valve regurgitation is moderate by color flow Doppler.  4. The aortic valve is tricuspid Moderate thickening of the aortic valve Moderate calcification of the aortic valve. Aortic valve regurgitation is mild by color flow  Doppler.  5. The aortic root is normal in size and structure.  ASSESSMENT AND PLAN:  1.  Atypical chest pain, chest pain symptoms of midsternal chest tightness with radiation to the back is very suggestive of angina pectoris however she also has normal EKG, normal physical exam, cardiac markers are negative for myocardial injury in spite of persistent chest discomfort including presence of chest discomfort this morning.  Also chest pain worsens on taking deep breath suggestive of musculoskeletal etiology. 2.  Aortic atherosclerosis, age appropriate. 3.  Prediabetes mellitus/hyperglycemia. 4.  Very mild hyperlipidemia however has marked elevation in HDL and minimal elevation in LDL.  But in view of prediabetes mellitus and also aortic atherosclerosis we will start her on Crestor 5 mg daily. 5.  Mitral valve prolapse I have personally reviewed the echocardiogram.  She has very mild mitral valve prolapse and posteriorly directed mild to moderate MR, very mild AI, I do not see significant calcification.  No endocarditis prophylaxis is indicated.  Recommendation: Chest pain symptoms are more indicating of musculoskeletal etiology in view of above findings.  She did receive nitroglycerin without significant change in her chest pain symptoms yesterday on presentation to the emergency room.   Do not suspect ACS.  But she is still having very minimal chest tightness again worse on taking deep breaths, CT scan of the chest was reviewed, no evidence of pulmonary embolism and no EKG abnormalities to suggest either pericarditis or PE.  Will give 1 dose of Aleve, will ambulate the patient, if there is no worsening chest pain, I have extensively discussed with her regarding symptoms of angina pectoris, she can be discharged home safely.  She will continue Aleve for 2 to 3 days, if chest pain persist, she will contact us.  She will eventually need outpatient stress testing but this can be done electively, patient has  been fairly active since her hip surgery in December, did well without periprocedural cardiac complications, has been walking at least half a mile to a mile a day and also worked in the yard yesterday without any chest discomfort.  Thank you so much for the consultation.  Please do not hesitate to contact me if you have any questions.  Patient's RN present and I have discussed and left instructions regarding her disposition.  If chest pain does get worse or is not relieved with activity, I would probably recommend cardiac catheterization.  Yates Decamp, MD, [facility] 05/03/2018, 1:35 PM [facility] [place] Pager: [phone] Office: [phone] If no answer Cell [phone]

## 2018-05-03 NOTE — ED Provider Notes (Signed)
TIME SEEN: 2:43 AM  CHIEF COMPLAINT: Chest pain  HPI: Patient is an 85 year old female with history of hypertension, hypothyroidism who presents to the emergency department chest pain that started approximately an hour and a half ago while at rest.  Describes it as a tightness, pressure that wraps around her chest and upper back and goes into her neck.  She states she feels short of breath, nauseated, lightheaded.  No previous history of stress test or cardiac catheterization.  No history of diabetes or hyperlipidemia.  Was a smoker as a teenager.  Does not have a cardiologist.  No history of PE or DVT.  No lower extremity swelling or pain.  Denies any aggravating or alleviating factors.  Denies fevers, cough.  ROS: See HPI Constitutional: no fever  Eyes: no drainage  ENT: no runny nose   Cardiovascular:   chest pain  Resp:  SOB  GI: no vomiting GU: no dysuria Integumentary: no rash  Allergy: no hives  Musculoskeletal: no leg swelling  Neurological: no slurred speech ROS otherwise negative  PAST MEDICAL HISTORY/PAST SURGICAL HISTORY:  Past Medical History:  Diagnosis Date  . Allergy   . Arthritis   . Cataract   . GERD (gastroesophageal reflux disease)   . Hypertension   . Hypothyroidism     MEDICATIONS:  Prior to Admission medications   Medication Sig Start Date End Date Taking? Authorizing Provider  ALPRAZolam Prudy Feeler) 0.5 MG tablet Take one by mouth one hour prior to public speaking prn Patient taking differently: Take 0.5 mg by mouth daily as needed (one hour prior to public speaking).  04/25/15   Nestor Ramp, MD  amLODipine (NORVASC) 2.5 MG tablet Take 1 tablet (2.5 mg total) by mouth daily. Patient taking differently: Take 2.5 mg by mouth at bedtime.  08/13/17   Nestor Ramp, MD  Aspirin-Salicylamide-Caffeine (BC HEADACHE PO) Take 1 packet by mouth daily as needed (pain).    [provider]  cholecalciferol (VITAMIN D3) 25 MCG (1000 UT) tablet Take 1,000 Units by  mouth daily.    [provider]  diazepam (VALIUM) 5 MG tablet One tab by mouth, 2 hours before procedure. Patient not taking: Reported on 03/10/2018 02/23/18   Judi Saa, DO  levothyroxine (SYNTHROID, LEVOTHROID) 75 MCG tablet Take 1 tablet (75 mcg total) by mouth daily. 05/13/17   Nestor Ramp, MD  OVER THE COUNTER MEDICATION Apply 1 application topically daily as needed (pain). CBD cream    [provider]  predniSONE (DELTASONE) 50 MG tablet Take 1 tablet (50 mg total) by mouth daily. Patient not taking: Reported on 03/10/2018 02/08/18   Judi Saa, DO  rizatriptan (MAXALT) 10 MG tablet Take 1 tablet (10 mg total) by mouth as needed for migraine. May repeat in 2 hours if needed 05/13/17   Nestor Ramp, MD  sucralfate (CARAFATE) 1 GM/10ML suspension TAKE 10 MLS BY MOUTH FOUR TIMES DAILY WITH MEALS AND AT BEDTIME Patient taking differently: Take 1 g by mouth 4 (four) times daily as needed (acid reflux).  11/30/17   Nestor Ramp, MD  Vitamin D, Ergocalciferol, (DRISDOL) 1.25 MG (50000 UT) CAPS capsule Take 1 capsule (50,000 Units total) by mouth every 7 (seven) days. Patient not taking: Reported on 03/10/2018 01/07/18   Judi Saa, DO    ALLERGIES:  Allergies  Allergen Reactions  . Beta Adrenergic Blockers Shortness Of Breath and Other (See Comments)    Chest pain, dizziness  . Candesartan Cilexetil-Hctz  Dyspnea on exertion  . Codeine Shortness Of Breath and Rash    Headache  . Penicillins Shortness Of Breath and Rash    Headache Did it involve swelling of the face/tongue/throat, SOB, or low BP? Yes Did it involve sudden or severe rash/hives, skin peeling, or any reaction on the inside of your mouth or nose? No Did you need to seek medical attention at a hospital or doctor's office? Unknown When did it last happen?childhood allergy If all above answers are "NO", may proceed with cephalosporin use.   . Ace Inhibitors Cough  . Combigan [Brimonidine  Tartrate-Timolol]     Chest pain  . Doxazosin     Unknown reaction  . Other     Steroids - malaise    SOCIAL HISTORY:  Social History   Tobacco Use  . Smoking status: Former Smoker    Years: 5.00    Types: Cigarettes    Last attempt to quit: 02/03/1957    Years since quitting: 61.2  . Smokeless tobacco: Never Used  . Tobacco comment: no plan to start again  Substance Use Topics  . Alcohol use: Yes    Alcohol/week: 7.0 standard drinks    Types: 7 Glasses of wine per week    Comment: social    FAMILY HISTORY: Family History  Problem Relation Age of Onset  . Colon cancer Mother   . Emphysema Father   . Aneurysm Brother   . Stroke Brother   . Colon polyps Daughter   . Colon cancer Brother     EXAM: BP (!) 183/90 (BP Location: Right Arm)   Pulse 72   Temp 98.6 F (37 C) (Oral)   Resp 11   Ht 5\' 2"  (1.575 m)   Wt 68 kg   SpO2 99%   BMI 27.44 kg/m  CONSTITUTIONAL: Alert and oriented and responds appropriately to questions.  Elderly, appears uncomfortable but not in distress HEAD: Normocephalic EYES: Conjunctivae clear, pupils appear equal, EOMI ENT: normal nose; moist mucous membranes NECK: Supple, no meningismus, no nuchal rigidity, no LAD  CARD: RRR; S1 and S2 appreciated; no murmurs, no clicks, no rubs, no gallops RESP: Normal chest excursion without splinting or tachypnea; breath sounds clear and equal bilaterally; no wheezes, no rhonchi, no rales, no hypoxia or respiratory distress, speaking full sentences ABD/GI: Normal bowel sounds; non-distended; soft, non-tender, no rebound, no guarding, no peritoneal signs, no hepatosplenomegaly BACK:  The back appears normal and is non-tender to palpation, there is no CVA tenderness EXT: Normal ROM in all joints; non-tender to palpation; no edema; normal capillary refill; no cyanosis, no calf tenderness or swelling    SKIN: Normal color for age and race; warm; no rash NEURO: Moves all extremities equally PSYCH: The  patient's mood and manner are appropriate. Grooming and personal hygiene are appropriate.  MEDICAL DECISION MAKING: Patient here with chest pain.  She has concerning story for cardiac chest pain.  Her hear score is 5.  EKG shows no ischemic abnormality.  Will obtain cardiac labs, chest x-ray.  Will give aspirin, nitroglycerin, Zofran for symptomatic relief.  ED PROGRESS: Patient's labs unremarkable including negative troponin.  Chest x-ray clear.  Pain is gone down from a 10 down to a 3:07 nitroglycerin tablets.  Still complaining of pain that radiates into her back.  Will obtain a CTA to rule out dissection.  Anticipate admission.  4:40 AM  Pt's dissection study negative.  Patient is a family medicine patient.  Will discuss with them for admission for  chest pain rule out.  4:56 AM Discussed patient's case with FM resident, Dr. Homero Fellers.  I have recommended admission and patient (and family if present) agree with this plan. Admitting physician will place admission orders.   I reviewed all nursing notes, vitals, pertinent previous records, EKGs, lab and urine results, imaging (as available).    EKG Interpretation  Date/Time:  Monday May 03 2018 02:35:55 EDT Ventricular Rate:  74 PR Interval:    QRS Duration: 89 QT Interval:  378 QTC Calculation: 420 R Axis:   45 Text Interpretation:  Sinus rhythm Abnormal R-wave progression, early transition No significant change since last tracing Confirmed by Tomie Elko, Baxter Hire [phone]) on 05/03/2018 2:43:58 AM         Bellamy Rubey, Layla Maw, DO 05/03/18 [id number]

## 2018-05-03 NOTE — Discharge Instructions (Signed)
Your chest pain does not appear to be related to your heart.  It is most likely a Gastrointestinal cause, such as acid reflux.    We have written a prescription for an Over the counter medicine that helps with acid reflux called Pepcid.  Please take this instead of your sucralfate/carafate.    Please call our office to schedule an appointment with [place] for one week from today.

## 2018-05-03 NOTE — Progress Notes (Signed)
FPTS Interim Progress Note  S:patient feels much better this morning.  Pain went from 10/10 to 3/10.    O: BP 129/74   Pulse 60   Temp 98.6 F (37 C) (Oral)   Resp 18   Ht 5\' 2"  (1.575 m)   Wt 68 kg   SpO2 98%   BMI 27.44 kg/m   CV: Regular rhythm. Normal rate. No murmurs. Mildly TTP on sternum. No pitting edema.  Pulm: Lungs clear to auscultation bilaterally.  No wheezes. No crackles.  .  A/P: Chest pain - VSS, trop neg, CBC/BMP normal, CTA negative, EKG nml - cards consult - h2 blocker - f/u echo  Sandre Kitty, MD 05/03/2018, 6:08 AM PGY-1, [facility] Family Medicine Service pager [phone]

## 2018-05-03 NOTE — ED Triage Notes (Signed)
Per pt she woke up this morning out of her sleep with chest pain in the center that radiates to her back. Also having sob with nausea. No weakness.

## 2018-05-24 ENCOUNTER — Telehealth (INDEPENDENT_AMBULATORY_CARE_PROVIDER_SITE_OTHER): Payer: Medicare Other | Admitting: Family Medicine

## 2018-05-24 ENCOUNTER — Encounter: Payer: Self-pay | Admitting: Family Medicine

## 2018-05-24 ENCOUNTER — Other Ambulatory Visit: Payer: Self-pay

## 2018-05-24 DIAGNOSIS — K219 Gastro-esophageal reflux disease without esophagitis: Secondary | ICD-10-CM

## 2018-05-24 DIAGNOSIS — E785 Hyperlipidemia, unspecified: Secondary | ICD-10-CM

## 2018-05-24 DIAGNOSIS — I10 Essential (primary) hypertension: Secondary | ICD-10-CM

## 2018-05-24 DIAGNOSIS — E034 Atrophy of thyroid (acquired): Secondary | ICD-10-CM

## 2018-05-24 MED ORDER — ROSUVASTATIN CALCIUM 5 MG PO TABS: 5.0000 mg | ORAL_TABLET | Freq: Every day | ORAL | 2 refills | Status: DC

## 2018-05-24 MED ORDER — LEVOTHYROXINE SODIUM 75 MCG PO TABS: 75.0000 ug | ORAL_TABLET | Freq: Every day | ORAL | 3 refills | Status: DC

## 2018-05-24 MED ORDER — FAMOTIDINE 20 MG PO TABS: 20.0000 mg | ORAL_TABLET | Freq: Every day | ORAL | 11 refills | Status: DC

## 2018-05-24 NOTE — Progress Notes (Signed)
[facility] Telemedicine Visit  Patient consented to have virtual visit. Method of visit: Telephone The patient declined a video visit as she reports she cannot work the video function on her cell phone. Encounter participants: Patient: Vanessa Melton - located at home Provider: Westley Chandler - located at [facility]  Chief Complaint: medication refill and hospital follow up   HPI: MARLOE Melton is a pleasant 85 year old with history significant for hypothyroidism, hip fracture, nonspecific chest pain thought to be due to acid reflux and dyslipidemia presenting via telephone visit for hospital follow-up and medication refill.  The patient reports that overall she is doing okay.  Her and her husband have been doing a lot of yard work.  They have been taking care of their now 32-month-old [place] puppy.  He is being better behaved at this time.  The patient was admitted in March for chest pain.  CTPA at that time was negative for pulmonary embolism.  She was evaluated by cardiology who felt this was either gastrointestinal or musculoskeletal in origin.  The patient reports complete resolution of her chest pain with the use of famotidine.  She is taking this once a day.  She denies any chest pain, dyspnea lower extremity swelling or palpitations.  She feels overall well.  She wonders if she should continue her Crestor.  The patient takes levothyroxine for hypothyroidism.  Her last TSH in the hospital was within normal limits. ROS: per HPI  Pertinent PMHx:  Hip fracture, managed without surgical intervention, recently saw Dr. Luiz Blare who diagnosed her with trochanteric bursitis.  He recommended NSAIDs which she declined given the fact that she felt it would irritate her stomach.  She reports this is doing well at this time.   Exam:  Respiratory: Speaking in full sentences and breathing comfortably on room air.  Assessment/Plan: Diagnoses and all orders for this  visit:  Gastroesophageal reflux disease without esophagitis, symptoms are well controlled.  She has not had recurrence of her chest pain.  Agreed with titrating down on the famotidine as tolerated.  Refilled medication -     famotidine (PEPCID) 20 MG tablet; Take 1 tablet (20 mg total) by mouth daily.  Hypothyroidism due to acquired atrophy of thyroid -     levothyroxine (SYNTHROID) 75 MCG tablet; Take 1 tablet (75 mcg total) by mouth daily.  Dyslipidemia, the patient's ten-year cardiovascular risk score is around 5%, this is based upon data though in a patient who would be age 29.  The patient is above the age range for which the current ACC ASCVD risk calculator has been validated.  She does have aortic atherosclerosis on her CT, however the clinical implications of this radiographic finding are uncertain.  She has a history of prediabetes.  The most recent data suggest that patients with overt diabetes do indeed benefit from statin therapy until age 62.  The data is less certain for those with prediabetes or no diabetes.  We will continue at this time patient to continue and discuss with Dr. Jennette Kettle regarding continuing this medication in the long-term.  The patient does not like taking medications and we will continue to discuss the long-term use of this medication given the increased risk of progression overt diabetes. -     rosuvastatin (CRESTOR) 5 MG tablet; Take 1 tablet (5 mg total) by mouth daily at 6 PM. - Repeat lipid panel at followup.   Time spent during visit with patient: 15 minutes

## 2018-06-30 ENCOUNTER — Other Ambulatory Visit: Payer: Self-pay

## 2018-06-30 ENCOUNTER — Telehealth (INDEPENDENT_AMBULATORY_CARE_PROVIDER_SITE_OTHER): Payer: Medicare Other | Admitting: Family Medicine

## 2018-06-30 DIAGNOSIS — K219 Gastro-esophageal reflux disease without esophagitis: Secondary | ICD-10-CM

## 2018-06-30 DIAGNOSIS — M25559 Pain in unspecified hip: Secondary | ICD-10-CM | POA: Insufficient documentation

## 2018-06-30 NOTE — Progress Notes (Signed)
[facility] Telemedicine Visit  Patient consented to have visit conducted via telephone.  Encounter participants: Patient: Vanessa Melton  Provider: [name]  Others (if applicable): none  Chief Complaint / HPI #1.  Follow-up chest pain: Has not had any recurrences since discharge from the hospital.  Still a bit puzzled by what happened as she had acute onset of substernal chest pain but the cardiac work-up was negative.  Cardiologist followed up in outpatient told her to return to activity.  She still has some questions about all of that. 2.  At one time we had started her on Carafate which seemed to work well.  While in the hospital they switched her to famotidine which is also seem to work well.  She has questions about which is better and if there are side effects to long-term famotidine. 3.  Stressors: She is used to getting out and about and going to the gym 5 days a week so the coronavirus restrictions have really been a hardship for her.  She also had that issue with a subacute hip fracture so she is not been as active.  Said she felt really kind of upset earlier this week but now it is better. #4[name] if she should go ahead and set up her colonoscopy.  She has had another couple of family members have colon polyps.  She is not having any abdominal pain, no change in bowel habits, no blood in stool.  Multiple family members including first-degree relatives have had colon cancer however. ROS: No fever No productive cough.  No chest pain.  No unusual weight change No abdominal pain  OBJECTIVE observed via telemedicine device: PSYCH: AxOx4... Appropriate speech fluency and content. Asks and answers questions appropriately. Mood is congruent. RESPIRATORY: no unusual sounds of labored breathing    Pertinent PMHx / PSHx:  I have reviewed the patient's medications, allergies, past medical and surgical history, smoking status and updated in the EMR as  appropriate.   Assessment/Plan:    Time spent on phone with patient: 24 minutes #1.  Chest pain: No recurrence.  She stopped the sucralfate.  She has continued on the famotidine.  Has had no more chest symptoms.  Wonders how long she should stay on the famotidine.  We discussed.  I would continue famotidine without definitive stop date.  If she has recurrence of symptoms, add sucralfate.  Can use that as needed.  If she is having to use it more than 3 or 4 times a week, she needs to let me know so we could consider repeat EGD. She has been walking 1 to 2 miles daily without any symptoms.  2.  Stressors: States she had a little "meltdown" the other day just because she is so tired of being at home.  Has not been out to do her usual activities such as riding her horse, going to the gym.   We discussed.  I think it would be fine for her to go out and ride as long as she and other participants were masked in the Social distancing.  She seems somewhat relieved by that.  Said that at least she can go out and brush her horse etc.  She is also considering going back out to the driving range now that her hip is not bothering her  3.  Hip pain: History of subacute hip fracture: No problems currently..  She is not been golfing and obviously has not been to the gym but  otherwise she has been active around the house and has had no episodes of pain.  Has been walking 1 to 2 miles without issue.  I recommended she gradually get back into normal activities.  4.  History of colon polyps/colonoscopy: Large history of family colon cancer.  Her last colonoscopy was 2016.  The doctor told her 3 to 5 years.  She has been worrying about it so I urged her to call GI and discussed with him.  Is actually been closer to 4 years so probably they can go ahead and repeat.  She is asymptomatic in the colon standpoint. 5.  Hypertension: Well-controlled by her report.  Needs refills.  Follow-up: Next 2 to 3 months and as  needed.

## 2018-08-08 ENCOUNTER — Other Ambulatory Visit: Payer: Self-pay | Admitting: Family Medicine

## 2018-08-08 DIAGNOSIS — E034 Atrophy of thyroid (acquired): Secondary | ICD-10-CM

## 2018-09-23 ENCOUNTER — Encounter: Payer: Self-pay | Admitting: Family Medicine

## 2018-09-23 DIAGNOSIS — K635 Polyp of colon: Secondary | ICD-10-CM | POA: Insufficient documentation

## 2018-11-02 ENCOUNTER — Encounter: Payer: Self-pay | Admitting: Family Medicine

## 2018-11-11 ENCOUNTER — Encounter: Payer: Self-pay | Admitting: Family Medicine

## 2018-12-18 ENCOUNTER — Encounter: Payer: Self-pay | Admitting: Family Medicine

## 2019-02-15 ENCOUNTER — Ambulatory Visit: Payer: Medicare Other | Attending: Internal Medicine

## 2019-02-15 DIAGNOSIS — Z23 Encounter for immunization: Secondary | ICD-10-CM | POA: Insufficient documentation

## 2019-02-15 NOTE — Progress Notes (Signed)
   Covid-19 Vaccination Clinic  Name:  Vanessa Melton    MRN: [id number] DOB: [birth year]  02/15/2019  [name] was observed post Covid-19 immunization for 30 minutes based on pre-vaccination screening without incidence. She was provided with Vaccine Information Sheet and instruction to access the V-Safe system.   [name] was instructed to call 911 with any severe reactions post vaccine: Marland Kitchen Difficulty breathing  . Swelling of your face and throat  . A fast heartbeat  . A bad rash all over your body  . Dizziness and weakness   Patient had some dizziness, nausea, and felt like her throat felt a little swollen. VS taken- BP173/91, O2 100%, HR 80. Repeat BP 151/69. Patient given water and sat for an additional 10 minute, patient refused any meications. Patient states that she felt better after the additional 10 minutes. Pt was escorted out of the clinic.   Immunizations Administered    Name Date Dose VIS Date Route   Pfizer COVID-19 Vaccine 02/15/2019 12:42 PM 0.3 mL 01/14/2019 Intramuscular   Manufacturer: [employer], [employer]   Lot: [id number]   NDC: [address]-2

## 2019-03-07 ENCOUNTER — Ambulatory Visit: Payer: Medicare PPO | Attending: Internal Medicine

## 2019-03-07 DIAGNOSIS — Z23 Encounter for immunization: Secondary | ICD-10-CM

## 2019-03-07 NOTE — Progress Notes (Signed)
   Covid-19 Vaccination Clinic  Name:  Vanessa Melton    MRN: [id number] DOB: 06-27-1932  03/07/2019  [name] was observed post Covid-19 immunization for 30 minutes based on pre-vaccination screening without incidence. She was provided with Vaccine Information Sheet and instruction to access the V-Safe system.   [name] was instructed to call 911 with any severe reactions post vaccine: Marland Kitchen Difficulty breathing  . Swelling of your face and throat  . A fast heartbeat  . A bad rash all over your body  . Dizziness and weakness    Immunizations Administered    Name Date Dose VIS Date Route   Pfizer COVID-19 Vaccine 03/07/2019 11:14 AM 0.3 mL 01/14/2019 Intramuscular   Manufacturer: [employer], [employer]   Lot: [id number]   NDC: [address]-2

## 2019-03-11 ENCOUNTER — Encounter: Payer: Self-pay | Admitting: Family Medicine

## 2019-03-29 DIAGNOSIS — H35371 Puckering of macula, right eye: Secondary | ICD-10-CM | POA: Diagnosis not present

## 2019-03-29 DIAGNOSIS — H35363 Drusen (degenerative) of macula, bilateral: Secondary | ICD-10-CM | POA: Diagnosis not present

## 2019-03-29 DIAGNOSIS — D3132 Benign neoplasm of left choroid: Secondary | ICD-10-CM | POA: Diagnosis not present

## 2019-03-29 DIAGNOSIS — H353131 Nonexudative age-related macular degeneration, bilateral, early dry stage: Secondary | ICD-10-CM | POA: Diagnosis not present

## 2019-03-29 DIAGNOSIS — H401112 Primary open-angle glaucoma, right eye, moderate stage: Secondary | ICD-10-CM | POA: Diagnosis not present

## 2019-03-29 DIAGNOSIS — T495X5S Adverse effect of ophthalmological drugs and preparations, sequela: Secondary | ICD-10-CM | POA: Diagnosis not present

## 2019-03-29 DIAGNOSIS — Z961 Presence of intraocular lens: Secondary | ICD-10-CM | POA: Diagnosis not present

## 2019-03-29 DIAGNOSIS — H35453 Secondary pigmentary degeneration, bilateral: Secondary | ICD-10-CM | POA: Diagnosis not present

## 2019-04-11 DIAGNOSIS — M13849 Other specified arthritis, unspecified hand: Secondary | ICD-10-CM | POA: Diagnosis not present

## 2019-04-11 DIAGNOSIS — M67442 Ganglion, left hand: Secondary | ICD-10-CM | POA: Diagnosis not present

## 2019-04-11 DIAGNOSIS — M79645 Pain in left finger(s): Secondary | ICD-10-CM | POA: Diagnosis not present

## 2019-04-26 DIAGNOSIS — H5319 Other subjective visual disturbances: Secondary | ICD-10-CM | POA: Diagnosis not present

## 2019-04-26 DIAGNOSIS — D487 Neoplasm of uncertain behavior of other specified sites: Secondary | ICD-10-CM | POA: Diagnosis not present

## 2019-05-02 DIAGNOSIS — Z20822 Contact with and (suspected) exposure to covid-19: Secondary | ICD-10-CM | POA: Diagnosis not present

## 2019-07-18 DIAGNOSIS — M67442 Ganglion, left hand: Secondary | ICD-10-CM | POA: Diagnosis not present

## 2019-07-29 ENCOUNTER — Other Ambulatory Visit: Payer: Self-pay | Admitting: *Deleted

## 2019-07-29 DIAGNOSIS — H26492 Other secondary cataract, left eye: Secondary | ICD-10-CM | POA: Diagnosis not present

## 2019-07-29 DIAGNOSIS — Z961 Presence of intraocular lens: Secondary | ICD-10-CM | POA: Diagnosis not present

## 2019-07-29 DIAGNOSIS — H401132 Primary open-angle glaucoma, bilateral, moderate stage: Secondary | ICD-10-CM | POA: Diagnosis not present

## 2019-07-30 MED ORDER — AMLODIPINE BESYLATE 2.5 MG PO TABS: ORAL_TABLET | ORAL | 3 refills | Status: DC

## 2019-08-16 DIAGNOSIS — H401112 Primary open-angle glaucoma, right eye, moderate stage: Secondary | ICD-10-CM | POA: Diagnosis not present

## 2019-08-17 DIAGNOSIS — Z4789 Encounter for other orthopedic aftercare: Secondary | ICD-10-CM | POA: Diagnosis not present

## 2019-08-17 DIAGNOSIS — M79642 Pain in left hand: Secondary | ICD-10-CM | POA: Diagnosis not present

## 2019-08-17 DIAGNOSIS — M71342 Other bursal cyst, left hand: Secondary | ICD-10-CM | POA: Diagnosis not present

## 2019-08-29 DIAGNOSIS — H5032 Intermittent alternating esotropia: Secondary | ICD-10-CM | POA: Diagnosis not present

## 2019-08-29 DIAGNOSIS — H5022 Vertical strabismus, left eye: Secondary | ICD-10-CM | POA: Diagnosis not present

## 2019-09-21 DIAGNOSIS — H5032 Intermittent alternating esotropia: Secondary | ICD-10-CM | POA: Diagnosis not present

## 2019-09-21 DIAGNOSIS — H35453 Secondary pigmentary degeneration, bilateral: Secondary | ICD-10-CM | POA: Diagnosis not present

## 2019-09-21 DIAGNOSIS — H401112 Primary open-angle glaucoma, right eye, moderate stage: Secondary | ICD-10-CM | POA: Diagnosis not present

## 2019-09-21 DIAGNOSIS — H532 Diplopia: Secondary | ICD-10-CM | POA: Diagnosis not present

## 2019-09-21 DIAGNOSIS — H35371 Puckering of macula, right eye: Secondary | ICD-10-CM | POA: Diagnosis not present

## 2019-09-21 DIAGNOSIS — H5022 Vertical strabismus, left eye: Secondary | ICD-10-CM | POA: Diagnosis not present

## 2019-09-21 DIAGNOSIS — H353131 Nonexudative age-related macular degeneration, bilateral, early dry stage: Secondary | ICD-10-CM | POA: Diagnosis not present

## 2019-09-21 DIAGNOSIS — H35363 Drusen (degenerative) of macula, bilateral: Secondary | ICD-10-CM | POA: Diagnosis not present

## 2019-09-26 DIAGNOSIS — Z20828 Contact with and (suspected) exposure to other viral communicable diseases: Secondary | ICD-10-CM | POA: Diagnosis not present

## 2019-09-28 DIAGNOSIS — H5022 Vertical strabismus, left eye: Secondary | ICD-10-CM | POA: Diagnosis not present

## 2019-09-28 DIAGNOSIS — H5008 Alternating esotropia with other noncomitancies: Secondary | ICD-10-CM | POA: Diagnosis not present

## 2019-11-12 ENCOUNTER — Encounter: Payer: Self-pay | Admitting: Family Medicine

## 2019-11-14 ENCOUNTER — Other Ambulatory Visit: Payer: Self-pay | Admitting: Family Medicine

## 2019-11-14 ENCOUNTER — Other Ambulatory Visit: Payer: Self-pay | Admitting: *Deleted

## 2019-11-14 DIAGNOSIS — E034 Atrophy of thyroid (acquired): Secondary | ICD-10-CM

## 2019-11-14 MED ORDER — LEVOTHYROXINE SODIUM 75 MCG PO TABS: 75.0000 ug | ORAL_TABLET | Freq: Every day | ORAL | 3 refills | Status: DC

## 2019-11-22 DIAGNOSIS — Z961 Presence of intraocular lens: Secondary | ICD-10-CM | POA: Diagnosis not present

## 2019-11-22 DIAGNOSIS — H401132 Primary open-angle glaucoma, bilateral, moderate stage: Secondary | ICD-10-CM | POA: Diagnosis not present

## 2019-11-22 DIAGNOSIS — H26492 Other secondary cataract, left eye: Secondary | ICD-10-CM | POA: Diagnosis not present

## 2019-12-06 DIAGNOSIS — D487 Neoplasm of uncertain behavior of other specified sites: Secondary | ICD-10-CM | POA: Diagnosis not present

## 2019-12-06 DIAGNOSIS — H35362 Drusen (degenerative) of macula, left eye: Secondary | ICD-10-CM | POA: Diagnosis not present

## 2020-01-09 DIAGNOSIS — I10 Essential (primary) hypertension: Secondary | ICD-10-CM | POA: Diagnosis not present

## 2020-01-09 DIAGNOSIS — M9701XD Periprosthetic fracture around internal prosthetic right hip joint, subsequent encounter: Secondary | ICD-10-CM | POA: Diagnosis not present

## 2020-01-09 DIAGNOSIS — S72091D Other fracture of head and neck of right femur, subsequent encounter for closed fracture with routine healing: Secondary | ICD-10-CM | POA: Diagnosis not present

## 2020-03-13 DIAGNOSIS — H401132 Primary open-angle glaucoma, bilateral, moderate stage: Secondary | ICD-10-CM | POA: Diagnosis not present

## 2020-03-16 DIAGNOSIS — H35453 Secondary pigmentary degeneration, bilateral: Secondary | ICD-10-CM | POA: Diagnosis not present

## 2020-03-16 DIAGNOSIS — D3132 Benign neoplasm of left choroid: Secondary | ICD-10-CM | POA: Diagnosis not present

## 2020-03-16 DIAGNOSIS — H401112 Primary open-angle glaucoma, right eye, moderate stage: Secondary | ICD-10-CM | POA: Diagnosis not present

## 2020-03-16 DIAGNOSIS — H353131 Nonexudative age-related macular degeneration, bilateral, early dry stage: Secondary | ICD-10-CM | POA: Diagnosis not present

## 2020-03-16 DIAGNOSIS — H35363 Drusen (degenerative) of macula, bilateral: Secondary | ICD-10-CM | POA: Diagnosis not present

## 2020-03-16 DIAGNOSIS — H35371 Puckering of macula, right eye: Secondary | ICD-10-CM | POA: Diagnosis not present

## 2020-03-16 DIAGNOSIS — H43811 Vitreous degeneration, right eye: Secondary | ICD-10-CM | POA: Diagnosis not present

## 2020-03-20 DIAGNOSIS — H401111 Primary open-angle glaucoma, right eye, mild stage: Secondary | ICD-10-CM | POA: Diagnosis not present

## 2020-05-02 ENCOUNTER — Other Ambulatory Visit: Payer: Self-pay | Admitting: Family Medicine

## 2020-05-08 ENCOUNTER — Ambulatory Visit: Payer: Medicare PPO | Admitting: Family Medicine

## 2020-05-08 ENCOUNTER — Other Ambulatory Visit: Payer: Self-pay

## 2020-05-08 ENCOUNTER — Ambulatory Visit (HOSPITAL_COMMUNITY): Payer: Medicare PPO | Attending: Family Medicine

## 2020-05-08 VITALS — BP 160/70 | HR 70 | Ht 62.0 in | Wt 158.4 lb

## 2020-05-08 DIAGNOSIS — R42 Dizziness and giddiness: Secondary | ICD-10-CM | POA: Insufficient documentation

## 2020-05-08 NOTE — Progress Notes (Signed)
SUBJECTIVE:   CHIEF COMPLAINT / HPI: Dizzy spells   Vanessa Melton is an 87 year old female presenting for evaluation of episodic lightheadedness.   She reports recurrent episodes of lightheadedness/dizziness for the past 6 months, feels like it has been worse in the past week.  Had 2 episodes last night. Random in nature, cannot seem to find a pattern on association or timing of day.  Can happen every couple of days or go 1-2 weeks without an episode.  No association with position changes.  Seems to be always standing when it occurs, not doing strenuous activity when it occurs.  She describes every episode as feeling a "thump" in her chest immediately followed by a sensation that she is "looking through a heavy mesh green " and feeling lightheaded.  Reports feeling like her perception is off "like things are different color ". Will last about a minute or so, she stands very still and it will go away.  She has a history of migraines with aura and this is not similar.  No associated headache or room spinning sensation with this.  She also has a history of double vision with recent corrective surgery in September, has no blurred vision with episodes. No falls or LOC.  She has about 3 cups of coffee during the day (chronic routine for her), usually just 1 cup of water throughout the day.  She is very active at baseline.  Gets good sleep.  She works out at least 3 times weekly with 30 minutes of cardio and also does weights.  She still enjoys riding horses and playing golf on a regular basis.  She has not had any chest pain, palpitations, or shortness of breath with these activities.    BP systolic when she is at the gym prior to her workout and is usually 120-130s.    PERTINENT  PMH / PSH: Hypertension, migraines, MVP, hypothyroidism, aortic atherosclerosis  OBJECTIVE:   BP (!) 160/70   Pulse 70   Ht 5\' 2"  (1.575 m)   Wt 158 lb 6 oz (71.8 kg)   SpO2 98%   BMI 28.97 kg/m    Orthostatic  vitals: Line: 153/116, pulse 63 Sitting: 170/79, pulse 61 Standing: 148/83, pulse 77  General: Alert, NAD  HEENT: NCAT, MMM Cardiac: RRR no murmurs, gallops, or rubs noted Lungs: Clear bilaterally, no increased WOB on room air Abdomen: soft, non-tender Msk: Moves all extremities spontaneously  Ext: Warm, dry, 2+ distal pulses, no edema bilaterally Neuro: Alert and oriented.  CN II-XII intact.  EOMI, PERRLA without nystagmus.  5/5 upper and lower extremity strength bilaterally.  2/4 patellar reflexes bilaterally.  Sensation to light touch intact throughout.  Rapid hand motion intact.  No sensation of lightheadedness/dizziness or nystagmus with transition from sitting to standing or rapid head movement.  ASSESSMENT/PLAN:   Episodic lightheadedness 59-month progressive history, reports chest "thump" prior to approximate 1 minute of lightheadedness and perception abnormalities.  Differential broad with unclear etiology (may be multifactorial), however given presentation do question cardiac arrhythmia.  EKG NSR at baseline today.  Certainly considered orthostatic hypotension especially given her abnormal orthostatics today, however she endorses no symptoms with position changes feel that this is less likely (but may still be contributing).  Will check CBC, CMP, TSH (history of hypothyroid on Synthroid, last check 2020) to ensure no precipitating anemia, electrolyte derangement, or renal/liver/thyroid dysfunction.  Encouraged increasing water intake/decreasing coffee, not locking her knees when standing, and trial of compression stockings when standing for long periods  of time.  Place urgent referral to cardiology to consider Zio patch/alternative evaluation given presentation and significant patient concern.  Recommended episode journal to bring into visit.    Follow-up in our clinic in the next 2-3 weeks or with cardiology sooner pending availability. ED precautions discussed including persistence of  episode, chest pain, SOB, or syncopal episode.  Allayne Stack, DO [facility]

## 2020-05-08 NOTE — Patient Instructions (Addendum)
It was wonderful to see you today.  We will be checking labs to see if you have any contributing issue with your thyroid, kidney/liver, or anemia present.  I am also going to place referral to cardiology for considerations of having a heart monitor placed.  Please start keeping a journal on how often this is happening and what you were doing when it occurs.  I encourage you to increase the amount of water you drink during the day and slowly cut back coffee (~2 cups).  You can also can try wearing compression stockings to help bring the blood flow back up when you are standing for long periods of time.

## 2020-05-09 LAB — COMPREHENSIVE METABOLIC PANEL
ALT: 16 IU/L (ref 0–32)
AST: 18 IU/L (ref 0–40)
Albumin/Globulin Ratio: 1.9 (ref 1.2–2.2)
Albumin: 4.3 g/dL (ref 3.6–4.6)
Alkaline Phosphatase: 71 IU/L (ref 44–121)
BUN/Creatinine Ratio: 11 — ABNORMAL LOW (ref 12–28)
BUN: 9 mg/dL (ref 8–27)
Bilirubin Total: 0.4 mg/dL (ref 0.0–1.2)
CO2: 23 mmol/L (ref 20–29)
Calcium: 9.7 mg/dL (ref 8.7–10.3)
Chloride: 104 mmol/L (ref 96–106)
Creatinine, Ser: 0.84 mg/dL (ref 0.57–1.00)
Globulin, Total: 2.3 g/dL (ref 1.5–4.5)
Glucose: 95 mg/dL (ref 65–99)
Potassium: 4.1 mmol/L (ref 3.5–5.2)
Sodium: 142 mmol/L (ref 134–144)
Total Protein: 6.6 g/dL (ref 6.0–8.5)
eGFR: 68 mL/min/{1.73_m2} (ref >59)

## 2020-05-09 LAB — CBC
Hematocrit: 40.8 % (ref 34.0–46.6)
Hemoglobin: 13.4 g/dL (ref 11.1–15.9)
MCH: 29.2 pg (ref 26.6–33.0)
MCHC: 32.8 g/dL (ref 31.5–35.7)
MCV: 89 fL (ref 79–97)
Platelets: 187 10*3/uL (ref 150–450)
RBC: 4.59 x10E6/uL (ref 3.77–5.28)
RDW: 12.9 % (ref 11.7–15.4)
WBC: 5.7 10*3/uL (ref 3.4–10.8)

## 2020-05-09 LAB — TSH: TSH: 0.994 u[IU]/mL (ref 0.450–4.500)

## 2020-05-10 ENCOUNTER — Encounter: Payer: Self-pay | Admitting: Family Medicine

## 2020-05-10 DIAGNOSIS — R42 Dizziness and giddiness: Secondary | ICD-10-CM | POA: Insufficient documentation

## 2020-05-10 NOTE — Assessment & Plan Note (Addendum)
24-month progressive history, reports chest "thump" prior to approximate 1 minute of lightheadedness and perception abnormalities.  Differential broad with unclear etiology (may be multifactorial), however given presentation do question cardiac arrhythmia.  EKG NSR at baseline today.  Certainly considered orthostatic hypotension especially given her abnormal orthostatics today, however she endorses no symptoms with position changes feel that this is less likely (but may still be contributing).  Will check CBC, CMP, TSH (history of hypothyroid on Synthroid, last check 2020) to ensure no precipitating anemia, electrolyte derangement, or renal/liver/thyroid dysfunction.  Encouraged increasing water intake/decreasing coffee, not locking her knees when standing, and trial of compression stockings when standing for long periods of time.  Place urgent referral to cardiology to consider Zio patch/alternative evaluation given presentation and significant patient concern.  Recommended episode journal to bring into visit.

## 2020-05-21 ENCOUNTER — Encounter: Payer: Self-pay | Admitting: Family Medicine

## 2020-05-23 ENCOUNTER — Ambulatory Visit: Payer: Medicare PPO | Admitting: Cardiovascular Disease

## 2020-05-23 ENCOUNTER — Other Ambulatory Visit: Payer: Self-pay

## 2020-05-23 ENCOUNTER — Encounter: Payer: Self-pay | Admitting: Radiology

## 2020-05-23 ENCOUNTER — Encounter: Payer: Self-pay | Admitting: Cardiovascular Disease

## 2020-05-23 ENCOUNTER — Ambulatory Visit (INDEPENDENT_AMBULATORY_CARE_PROVIDER_SITE_OTHER): Payer: Medicare PPO

## 2020-05-23 VITALS — BP 140/80 | HR 64 | Ht 62.0 in | Wt 158.0 lb

## 2020-05-23 DIAGNOSIS — R42 Dizziness and giddiness: Secondary | ICD-10-CM

## 2020-05-23 DIAGNOSIS — I10 Essential (primary) hypertension: Secondary | ICD-10-CM

## 2020-05-23 NOTE — Assessment & Plan Note (Signed)
History of essential hypertension on low-dose amlodipine with blood pressure measured today 140/80.

## 2020-05-23 NOTE — Progress Notes (Signed)
Patient ID: Vanessa Melton, female   DOB: 19-Jan-1933, 87 y.o.   MRN: [id number] 14 day Zio XT monitor mailed to pt's home.

## 2020-05-23 NOTE — Progress Notes (Signed)
05/23/2020 Vanessa Melton   January 10, 1933  [id number]  Primary Physician Nestor Ramp, MD Primary Cardiologist: Runell Gess MD Nicholes Calamity, [place]  HPI:  Vanessa Melton is a 87 y.o. mildly overweight divorced Caucasian female mother of 3 children, grandmother of 2 grandchildren who is a retired Oceanographer at [employer].  She was referred by her PCP, Dr. Burnard Leigh, for evaluation of episodic dizziness.  She does have a history of treated hypertension.  She is never had a heart attack or stroke.  She works at [employer] 3 times a week does doing 30 minutes on a crosstrainer and other machines.  She plays golf and rides horses.  During all these episodes she has no symptoms.  She had a 2D echo performed 2 years ago and it for work-up of atypical chest pain showed normal LV systolic function with mild basal septal hypertrophy and no other valvular abnormalities.  She gets occasional episodic dizziness which she characterizes as a change in perception with a "thump in her chest.  She has never had syncope..   Current Meds  Medication Sig  . amLODipine (NORVASC) 2.5 MG tablet TAKE 1 TABLET(2.5 MG) BY MOUTH DAILY  . cholecalciferol (VITAMIN D3) 25 MCG (1000 UT) tablet Take 3,000 Units by mouth daily.   Marland Kitchen levothyroxine (SYNTHROID) 75 MCG tablet Take 1 tablet (75 mcg total) by mouth daily.  . Multiple Vitamins-Minerals (ICAPS AREDS 2 PO) Take 1 tablet by mouth 2 (two) times daily.  Marland Kitchen omeprazole (PRILOSEC) 40 MG capsule TAKE 1 CAPSULE BY MOUTH 20 MINUTES BEFORE BREAKFAST  . OVER THE COUNTER MEDICATION Apply 1 application topically daily as needed (pain). CBD cream  . rizatriptan (MAXALT) 10 MG tablet Take 1 tablet (10 mg total) by mouth as needed for migraine. May repeat in 2 hours if needed     Allergies  Allergen Reactions  . Beta Adrenergic Blockers Shortness Of Breath and Other (See Comments)    Chest pain, dizziness  . Candesartan Cilexetil-Hctz     Dyspnea on exertion  .  Codeine Shortness Of Breath and Rash    Headache  . Penicillins Shortness Of Breath and Rash    Headache Did it involve swelling of the face/tongue/throat, SOB, or low BP? Yes Did it involve sudden or severe rash/hives, skin peeling, or any reaction on the inside of your mouth or nose? No Did you need to seek medical attention at a hospital or doctor's office? Unknown When did it last happen?childhood allergy If all above answers are "NO", may proceed with cephalosporin use.   . Ace Inhibitors Cough  . Combigan [Brimonidine Tartrate-Timolol]     Chest pain  . Doxazosin     Unknown reaction  . Other     Steroids - swelling, weight gain     Social History   Socioeconomic History  . Marital status: Significant Other    Spouse name: Not on file  . Number of children: 3  . Years of education: 30  . Highest education level: Doctorate  Occupational History  . Occupation: retired professor    Comment: [employer]  Tobacco Use  . Smoking status: Former Smoker    Years: 5.00    Types: Cigarettes    Quit date: 02/03/1957    Years since quitting: 63.3  . Smokeless tobacco: Never Used  . Tobacco comment: no plan to start again  Vaping Use  . Vaping Use: Never used  Substance and  Sexual Activity  . Alcohol use: Yes    Alcohol/week: 7.0 standard drinks    Types: 7 Glasses of wine per week    Comment: social  . Drug use: No  . Sexual activity: Yes    Birth control/protection: None  Other Topics Concern  . Not on file  Social History Narrative   Significant other: Dr, [name]   Home is split level, 6 stairs. Handrails on stairs, has smoke alarms and fire extinguisher.   Exercises 5+ days a week 1-1.5 hours per day   Golfs   Shows horses--Arabian female named BOGO (after buying the mare she got him free--buy one get one=BOGO)   [address] [place]   Has an adopted Medical laboratory scientific officer.   Travels frequently   Wears seat belts in vehicles.      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  Cardiovascular: negative for chest pain, dyspnea on exertion, edema, orthopnea, palpitations, paroxysmal nocturnal dyspnea or shortness of breath Dermatological: negative for rash Respiratory: negative for cough or wheezing Urologic: negative for hematuria Abdominal: negative for nausea, vomiting, diarrhea, bright red blood per rectum, melena, or hematemesis Neurologic: negative for visual changes, syncope, or dizziness All other systems reviewed and are otherwise negative except as noted above.    Blood pressure 140/80, pulse 64, height 5\' 2"  (1.575 m), weight 158 lb (71.7 kg), SpO2 98 %.  General appearance: alert and no distress Neck: no adenopathy, no carotid bruit, no JVD, supple, symmetrical, trachea midline and thyroid not enlarged, symmetric, no tenderness/mass/nodules Lungs: clear to auscultation bilaterally Heart: regular rate and rhythm, S1, S2 normal, no murmur, click, rub or gallop Extremities: extremities normal, atraumatic, no cyanosis or edema Pulses: 2+ and symmetric Skin: Skin color, texture, turgor normal. No rashes or lesions Neurologic: Alert and oriented X 3, normal strength and tone. Normal symmetric reflexes. Normal coordination and gait  EKG not performed today  ASSESSMENT AND PLAN:   Essential hypertension History of essential hypertension on low-dose amlodipine with blood pressure measured today 140/80.  Dizziness Ms. Starry was referred to me by Earlene Plater, MD for episodic dizziness.  These episodes occur in waves.  She had a recent episode while sitting down.  She is  never had syncope.  She feels a "thump in her chest is change in depth perception.  I am not convinced that there is a cardiovascular cause.  I am going get a 2-week Zio patch to further evaluate.  She did have a 2D echo performed 2 years ago which was essentially normal.      Burnard Leigh MD FACP,FACC,FAHA, [facility] 05/23/2020 1:58 PM

## 2020-05-23 NOTE — Assessment & Plan Note (Signed)
Vanessa Melton was referred to me by Burnard Leigh, MD for episodic dizziness.  These episodes occur in waves.  She had a recent episode while sitting down.  She is never had syncope.  She feels a "thump in her chest is change in depth perception.  I am not convinced that there is a cardiovascular cause.  I am going get a 2-week Zio patch to further evaluate.  She did have a 2D echo performed 2 years ago which was essentially normal.

## 2020-05-23 NOTE — Patient Instructions (Signed)
Medication Instructions:  Your physician recommends that you continue on your current medications as directed. Please refer to the Current Medication list given to you today.  *If you need a refill on your cardiac medications before your next appointment, please call your pharmacy*   Testing/Procedures:  ZIO XT- Long Term Monitor Instructions   Your physician has requested you wear your ZIO patch monitor 14 days.   This is a single patch monitor.  [employer] supplies one patch monitor per enrollment.  Additional stickers are not available.   Please do not apply patch if you will be having a Nuclear Stress Test, Echocardiogram, Cardiac CT, MRI, or Chest Xray during the time frame you would be wearing the monitor. The patch cannot be worn during these tests.  You cannot remove and re-apply the ZIO XT patch monitor.   Your ZIO patch monitor will be sent USPS Priority mail from [employer] directly to your home address. The monitor may also be mailed to a PO BOX if home delivery is not available.   It may take 3-5 days to receive your monitor after you have been enrolled.   Once you have received you monitor, please review enclosed instructions.  Your monitor has already been registered assigning a specific monitor serial # to you.   Applying the monitor   Shave hair from upper left chest.   Hold abrader disc by orange tab.  Rub abrader in 40 strokes over left upper chest as indicated in your monitor instructions.   Clean area with 4 enclosed alcohol pads .  Use all pads to assure are is cleaned thoroughly.  Let dry.   Apply patch as indicated in monitor instructions.  Patch will be place under collarbone on left side of chest with arrow pointing upward.   Rub patch adhesive wings for 2 minutes.Remove white label marked "1".  Remove white label marked "2".  Rub patch adhesive wings for 2 additional minutes.   While looking in a mirror, press and release button in center of patch.  A  small green light will flash 3-4 times .  This will be your only indicator the monitor has been turned on.     Do not shower for the first 24 hours.  You may shower after the first 24 hours.   Press button if you feel a symptom. You will hear a small click.  Record Date, Time and Symptom in the Patient Log Book.   When you are ready to remove patch, follow instructions on last 2 pages of Patient Log Book.  Stick patch monitor onto last page of Patient Log Book.   Place Patient Log Book in Blue box.  Use locking tab on box and tape box closed securely.  The Orange and White box has prepaid postage on it.  Please place in mailbox as soon as possible.  Your physician should have your test results approximately 7 days after the monitor has been mailed back to [employer].   Call [employer] Customer Care at [phone] if you have questions regarding your ZIO XT patch monitor.  Call them immediately if you see an orange light blinking on your monitor.   If your monitor falls off in less than 4 days contact our Monitor department at [phone].  If your monitor becomes loose or falls off after 4 days call [employer] at [phone] for suggestions on securing your monitor.     Follow-Up: At [facility], you and your health needs are our priority.    As part of our continuing mission to provide you with exceptional heart care, we have created designated Provider Care Teams.  These Care Teams include your primary Cardiologist (physician) and Advanced Practice Providers (APPs -  Physician Assistants and Nurse Practitioners) who all work together to provide you with the care you need, when you need it.  We recommend signing up for the patient portal called "MyChart".  Sign up information is provided on this After Visit Summary.  MyChart is used to connect with patients for Virtual Visits (Telemedicine).  Patients are able to view lab/test results, encounter notes, upcoming appointments, etc.   Non-urgent messages can be sent to your provider as well.   To learn more about what you can do with MyChart, go to [email or web address].    Your next appointment:   No future appointments made at this time. We will see you on an as needed basis. If you need to schedule an appointment please call our office.  Provider:   Nanetta Batty, MD

## 2020-05-25 ENCOUNTER — Encounter: Payer: Self-pay | Admitting: Family Medicine

## 2020-05-25 ENCOUNTER — Other Ambulatory Visit: Payer: Self-pay

## 2020-05-25 ENCOUNTER — Ambulatory Visit: Payer: Medicare PPO | Admitting: Family Medicine

## 2020-05-25 VITALS — BP 142/72 | HR 90 | Wt 156.8 lb

## 2020-05-25 DIAGNOSIS — R42 Dizziness and giddiness: Secondary | ICD-10-CM

## 2020-05-25 NOTE — Progress Notes (Signed)
    SUBJECTIVE:   CHIEF COMPLAINT / HPI: f/u lightheadedness episodes   Vanessa Melton is a very active 87 year old female presenting for follow-up of episodic lightheadedness.  She was most recently seen in our office on 4/5 for initial evaluation, recommended increasing water intake and cardiology evaluation.  She was seen by cardiology on 4/20 with low suspicion that this is cardiac in etiology, however proceeding with a Zio patch.  Since last seen by myself, she reports she has had 2 additional episodes: 1st episode last Saturday, just walking around the house, lasted a few minutes with preceding chest thump.  2nd episode on this past Tuesday while sitting down working on her computer, no chest thump with this one, lasted about 2-3 minutes.  Still describes in similar fashion as previous, with "change in perception, green mesh".  No sensation of feeling like she will fall down, no previous syncope.   Drinking more water than previous. Cut down to about 2 cups of coffee.  Zio patch has not been mailed to her yet.  She last saw her ophthalmologist for postoperative follow-up in November prior to onset of these episodes.  She has a history of double vision with recent corrective surgery in 10/2019, told everything looked great when last seen in October/November.  She is due to follow-up with her [facility] oncologist Dr. 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   Her mom had a history of a small brain tumor s/p craniotomy in her 39s.  Additionally her father had normal pressure hydrocephalus s/p shunt placement in his 77s.   PERTINENT  PMH / PSH: Hypertension, migraines, MVP, hypothyroidism, aortic atherosclerosis  OBJECTIVE:   BP (!) 142/72   Pulse 90   Wt 156 lb 12.8 oz (71.1 kg)   SpO2 99%   BMI 28.68 kg/m   General: Alert, NAD HEENT: NCAT, MMM, oropharynx nonerythematous  Cardiac: RRR  Lungs: Clear  bilaterally, no increased WOB  Msk: normal gait  Ext: Warm, dry, 2+ distal pulses, no edema b/l Neuro: Alert and oriented.  EOMI/PERRLA without nystagmus.  CN II-XII intact.  5/5 upper and lower extremity strength bilaterally.  Speech easily understandable.  ASSESSMENT/PLAN:   Episodic lightheadedness 2 episodes in the past 2 weeks. Etiology remains unclear, currently pursuing cardiology work-up with Zio patch.  Given atypical visual/perception changes, recommended follow-up with her ophthalmologist to ensure no delayed complication from corrective eye surgery in 10/2019 as these episodes started shortly after.  Pending evaluation above, if no clear etiology and persistence of episodes, should consider neuroimaging however feel that this is less likely currently due to presentation.  Continue adequate hydration, reducing caffeine, and journaling episode occurrence.    Discussed with Dr. Miquel Dunn who agrees with plan as above.  Follow-up already scheduled with her PCP Dr. Jennette Kettle on 5/11.  ED precautions discussed.  Allayne Stack, DO [facility]

## 2020-05-25 NOTE — Patient Instructions (Signed)
It was wonderful to see you today.  Please make sure to keep track on if you have any recurrent episodes and what is happening at that time.  I do recommend you follow-up with your eye doctor just given all your recent eye surgeries and change in visual perception when these happen.  We will make sure that we follow-up the results of the Zio patch and continue evaluation from there.  Please make sure you continue to drink water and do not stand for prolonged periods of time.  If you have any persistent lightheadedness/dizziness, passing out episodes, chest pain, or shortness of breath please make sure you are evaluated right away.

## 2020-05-26 ENCOUNTER — Encounter: Payer: Self-pay | Admitting: Family Medicine

## 2020-05-26 NOTE — Assessment & Plan Note (Addendum)
2 episodes in the past 2 weeks. Etiology remains unclear, currently pursuing cardiology work-up with Zio patch.  Given atypical visual/perception changes, recommended follow-up with her ophthalmologist to ensure no delayed complication from corrective eye surgery in 10/2019 as these episodes started shortly after.  Pending evaluation above, if no clear etiology and persistence of episodes, should consider neuroimaging however feel that this is less likely currently due to presentation.  Continue adequate hydration, reducing caffeine, and journaling episode occurrence.

## 2020-05-28 DIAGNOSIS — I10 Essential (primary) hypertension: Secondary | ICD-10-CM | POA: Diagnosis not present

## 2020-05-28 DIAGNOSIS — R42 Dizziness and giddiness: Secondary | ICD-10-CM | POA: Diagnosis not present

## 2020-06-13 ENCOUNTER — Other Ambulatory Visit: Payer: Self-pay

## 2020-06-13 ENCOUNTER — Encounter: Payer: Self-pay | Admitting: Family Medicine

## 2020-06-13 ENCOUNTER — Ambulatory Visit (INDEPENDENT_AMBULATORY_CARE_PROVIDER_SITE_OTHER): Payer: Medicare PPO | Admitting: Family Medicine

## 2020-06-13 ENCOUNTER — Ambulatory Visit (INDEPENDENT_AMBULATORY_CARE_PROVIDER_SITE_OTHER): Payer: Medicare PPO

## 2020-06-13 VITALS — BP 122/70 | HR 71 | Ht 62.0 in | Wt 155.2 lb

## 2020-06-13 DIAGNOSIS — Z23 Encounter for immunization: Secondary | ICD-10-CM

## 2020-06-13 DIAGNOSIS — R42 Dizziness and giddiness: Secondary | ICD-10-CM

## 2020-06-13 DIAGNOSIS — Z0000 Encounter for general adult medical examination without abnormal findings: Secondary | ICD-10-CM

## 2020-06-13 DIAGNOSIS — G43109 Migraine with aura, not intractable, without status migrainosus: Secondary | ICD-10-CM

## 2020-06-13 DIAGNOSIS — I10 Essential (primary) hypertension: Secondary | ICD-10-CM | POA: Diagnosis not present

## 2020-06-13 NOTE — Patient Instructions (Signed)
I have sent in the shingles series. We updated your pneumonia shot today. Please  Let me know about your cardiology stuff!  See me in 4-6 weeks.

## 2020-06-13 NOTE — Progress Notes (Signed)
pre

## 2020-06-14 DIAGNOSIS — R42 Dizziness and giddiness: Secondary | ICD-10-CM | POA: Diagnosis not present

## 2020-06-14 DIAGNOSIS — I10 Essential (primary) hypertension: Secondary | ICD-10-CM | POA: Diagnosis not present

## 2020-06-15 DIAGNOSIS — Z0000 Encounter for general adult medical examination without abnormal findings: Secondary | ICD-10-CM | POA: Insufficient documentation

## 2020-06-15 DIAGNOSIS — G43109 Migraine with aura, not intractable, without status migrainosus: Secondary | ICD-10-CM | POA: Insufficient documentation

## 2020-06-15 NOTE — Assessment & Plan Note (Signed)
Potentially related to some cardiac arrhythmia.  She is being worked up by the heart doctor.  She already has a follow-up appointment with him.

## 2020-06-15 NOTE — Progress Notes (Signed)
    CHIEF COMPLAINT / HPI: #1.  Follow-up palpitations.  She is being seen by cardiology and has been wearing a ZIO patch.  She just finished that yesterday.  She is not had a lot more episodes. 2.  Health maintenance: She has questions about whether or not she should get the new pneumonia shot and whether or not she should get the shingles shot.  She had shingles many many years ago.     PERTINENT  PMH / PSH: I have reviewed the patient's medications, allergies, past medical and surgical history, smoking status and updated in the EMR as appropriate.   OBJECTIVE:  BP 122/70   Pulse 71   Ht 5\' 2"  (1.575 m)   Wt 155 lb 3.2 oz (70.4 kg)   SpO2 95%   BMI 28.39 kg/m  Vital signs reviewed. GENERAL: Well-developed, well-nourished, no acute distress. CARDIOVASCULAR: Regular rate and rhythm no murmur gallop or rub LUNGS: Clear to auscultation bilaterally, no rales or wheeze. ABDOMEN: Soft positive bowel sounds NEURO: No gross focal neurological deficits. MSK: Movement of extremity x 4.    ASSESSMENT / PLAN: I would ask her to follow-up with me in 4 to 6 weeks to catch up on her cardiac work-up.  She also has a seborrheic keratosis that we can freeze off at that time.  Episodic lightheadedness Potentially related to some cardiac arrhythmia.  She is being worked up by the heart doctor.  She already has a follow-up appointment with him.  Migraine aura without headache She has had more episodes of the migraine aura without headache recently.  She can tell when 1 is coming on, in fact she had 1 while she was at her appointment with me today.  Just visual changes.  Bright-colored lights, scintillations.  It resolved before her visit was complete.  Essential hypertension Excellent control.  Continue current medication regimen.  Healthcare maintenance Updated her pneumonia shot.  Prescription sent for shingles vaccine.  She is up-to-date on the remaining part for health maintenance.    MD

## 2020-06-15 NOTE — Assessment & Plan Note (Signed)
She has had more episodes of the migraine aura without headache recently.  She can tell when 1 is coming on, in fact she had 1 while she was at her appointment with me today.  Just visual changes.  Bright-colored lights, scintillations.  It resolved before her visit was complete.

## 2020-06-15 NOTE — Assessment & Plan Note (Signed)
Excellent control.  Continue current medication regimen.

## 2020-06-15 NOTE — Assessment & Plan Note (Signed)
Updated her pneumonia shot.  Prescription sent for shingles vaccine.  She is up-to-date on the remaining part for health maintenance.

## 2020-07-03 DIAGNOSIS — Z86010 Personal history of colonic polyps: Secondary | ICD-10-CM | POA: Diagnosis not present

## 2020-07-03 DIAGNOSIS — K219 Gastro-esophageal reflux disease without esophagitis: Secondary | ICD-10-CM | POA: Diagnosis not present

## 2020-07-03 DIAGNOSIS — K5730 Diverticulosis of large intestine without perforation or abscess without bleeding: Secondary | ICD-10-CM | POA: Diagnosis not present

## 2020-07-03 DIAGNOSIS — K449 Diaphragmatic hernia without obstruction or gangrene: Secondary | ICD-10-CM | POA: Diagnosis not present

## 2020-07-03 DIAGNOSIS — Z800 Family history of malignant neoplasm of digestive organs: Secondary | ICD-10-CM | POA: Diagnosis not present

## 2020-07-25 ENCOUNTER — Encounter: Payer: Self-pay | Admitting: Family Medicine

## 2020-07-25 ENCOUNTER — Other Ambulatory Visit: Payer: Self-pay

## 2020-07-25 ENCOUNTER — Ambulatory Visit: Payer: Medicare PPO | Admitting: Family Medicine

## 2020-07-25 DIAGNOSIS — R42 Dizziness and giddiness: Secondary | ICD-10-CM | POA: Diagnosis not present

## 2020-07-25 DIAGNOSIS — Z0000 Encounter for general adult medical examination without abnormal findings: Secondary | ICD-10-CM | POA: Diagnosis not present

## 2020-07-25 DIAGNOSIS — L821 Other seborrheic keratosis: Secondary | ICD-10-CM | POA: Diagnosis not present

## 2020-07-25 NOTE — Progress Notes (Signed)
    CHIEF COMPLAINT / HPI: #1.  Follow up episodes of dizziness.  She saw the cardiologist and completed the Zio patch but has not yet heard back from them.  She did see the preliminary report in my chart.  She has had a couple of other episodes but none more as long or as severe as the earlier episodes.  The episodes seem to be less associated with any visual changes and occur more at rest.  No chest pain with them. 2.  Follow-up for itchy area on her back.  We saw it last time and it continues to be problematic.   PERTINENT  PMH / PSH: I have reviewed the patient's medications, allergies, past medical and surgical history, smoking status and updated in the EMR as appropriate.   OBJECTIVE:  BP 130/84   Pulse 68   Ht 5\' 2"  (1.575 m)   Wt 154 lb 9.6 oz (70.1 kg)   SpO2 97%   BMI 28.28 kg/m  GENERAL: Well-developed female no acute distress CV: Regular rate and rhythm no murmur Skin: Slightly raised hyperpigmented seborrheic keratosis right below the bra line in the mid back.  procedure: We froze this area x3 with liquid nitrogen.  Band-Aid applied.  ASSESSMENT / PLAN: #2.  Seborrheic keratosis, irritated and pruritic.  Liquid nitrogen freeze today.  If she has further problems with it or it does not resolve, she will let me know.  No problem-specific Assessment & Plan notes found for this encounter.   MD

## 2020-07-25 NOTE — Patient Instructions (Signed)
Call your cardiologists office, or use My Chart, and see what they recommend after reviewing your Zio Patch results. Let me know if you have any issues.  I sent in the Tdap rx to your pharmacy We updated your pneumonia shot last time.  Great to see you. I would probably recommend we touch base in a month or so--My Chart is fine---about your dizziness. Sooner with problems.

## 2020-07-25 NOTE — Assessment & Plan Note (Signed)
She wanted to get a prescription for Tdap which I gave her today.

## 2020-07-25 NOTE — Assessment & Plan Note (Signed)
I reviewed the preliminary reading of her Zio patch but not really sure I can do a formal interpretation of that.  I recommended she call her cardiologist or send him a MyChart message.  She did have a couple of episodes of short runs of V. tach.  I do not know if that is of concern or not.  Will await their read.  I like to see her back in 4 weeks or at least touch base with her about the dizziness.  If she has worsening symptoms in the interim, she will let me know.

## 2020-08-04 ENCOUNTER — Other Ambulatory Visit: Payer: Self-pay | Admitting: Family Medicine

## 2020-08-04 DIAGNOSIS — E034 Atrophy of thyroid (acquired): Secondary | ICD-10-CM

## 2020-08-07 DIAGNOSIS — H318 Other specified disorders of choroid: Secondary | ICD-10-CM | POA: Diagnosis not present

## 2020-08-07 DIAGNOSIS — H43811 Vitreous degeneration, right eye: Secondary | ICD-10-CM | POA: Diagnosis not present

## 2020-09-07 DIAGNOSIS — D3132 Benign neoplasm of left choroid: Secondary | ICD-10-CM | POA: Diagnosis not present

## 2020-09-07 DIAGNOSIS — H35453 Secondary pigmentary degeneration, bilateral: Secondary | ICD-10-CM | POA: Diagnosis not present

## 2020-09-07 DIAGNOSIS — Z961 Presence of intraocular lens: Secondary | ICD-10-CM | POA: Diagnosis not present

## 2020-09-07 DIAGNOSIS — H35363 Drusen (degenerative) of macula, bilateral: Secondary | ICD-10-CM | POA: Diagnosis not present

## 2020-09-07 DIAGNOSIS — H35373 Puckering of macula, bilateral: Secondary | ICD-10-CM | POA: Diagnosis not present

## 2020-09-07 DIAGNOSIS — H353131 Nonexudative age-related macular degeneration, bilateral, early dry stage: Secondary | ICD-10-CM | POA: Diagnosis not present

## 2020-09-27 ENCOUNTER — Encounter: Payer: Self-pay | Admitting: Family Medicine

## 2020-10-10 ENCOUNTER — Ambulatory Visit: Payer: Medicare PPO | Admitting: Family Medicine

## 2020-10-10 ENCOUNTER — Encounter: Payer: Self-pay | Admitting: Family Medicine

## 2020-10-10 ENCOUNTER — Other Ambulatory Visit: Payer: Self-pay

## 2020-10-10 VITALS — BP 128/74 | HR 74 | Ht 62.0 in | Wt 153.4 lb

## 2020-10-10 DIAGNOSIS — G44309 Post-traumatic headache, unspecified, not intractable: Secondary | ICD-10-CM | POA: Diagnosis not present

## 2020-10-11 DIAGNOSIS — G44309 Post-traumatic headache, unspecified, not intractable: Secondary | ICD-10-CM | POA: Insufficient documentation

## 2020-10-11 NOTE — Assessment & Plan Note (Signed)
Most likely this is a skull contusion.  I think she would feel better if we ruled out something like a subdural hematoma or occult skull fracture.  I agree that 6 weeks is a long time for this to hang around and not resolved.  We will set up CT brain/head and let her know the results.  She will call me in the interim if any new or worsening symptoms.

## 2020-10-11 NOTE — Progress Notes (Signed)
    CHIEF COMPLAINT / HPI:   Left-sided head pain.  She had a fall 6 weeks ago in her bathroom where she hit her head on the floor on the left side of her temple.  It has remained quite sore there.  For the first couple of days after the fall she had some blurry vision but that resolved.  She thinks she had a mild concussion.  Other than the tenderness to palpation on the temporal area of her skull and of her left outer ear, she notes that her migraines are now little more frequent.  They have traditionally started in the left side of her head and they continue to do that but she seems to be having them more frequently.  No symptoms that are new with the migraine.  She otherwise feels well but thinks its been 6 weeks and she is concerned that the tenderness has not gone away and concerned that the area may be a focus for migraines.  PERTINENT  PMH / PSH: I have reviewed the patient's medications, allergies, past medical and surgical history, smoking status and updated in the EMR as appropriate. Not on any regular anticoagulation She does have longstanding history of intermittent chronic dizziness but this is not changed.  OBJECTIVE:  BP 128/74   Pulse 74   Ht 5\' 2"  (1.575 m)   Wt 153 lb 6.4 oz (69.6 kg)   SpO2 98%   BMI 28.06 kg/m  GENERAL: Well-developed female no acute distress HEENT: Skull is mildly tender to palpation in the left temporal and parietal area but there is no skull defect that I can palpate.  There is no bruising in this area.  The left pinna is also mildly tender to deep palpation.  Her jaw opens and closes without any pain.  TMs look normal and there is no sign of hemotympanums.  Extraocular muscles are intact conjunctive is not icteric, pupils equal round reactive to light, convergence is normal. NECK: Full range of motion.  Nontender to palpation and percussion. NEURO: Upper extremity strength is normal.  She rises from a chair without any assistance and has a normal  gait  ASSESSMENT / PLAN:   Post-traumatic headache, not intractable Most likely this is a skull contusion.  I think she would feel better if we ruled out something like a subdural hematoma or occult skull fracture.  I agree that 6 weeks is a long time for this to hang around and not resolved.  We will set up CT brain/head and let her know the results.  She will call me in the interim if any new or worsening symptoms.   MD

## 2020-10-15 ENCOUNTER — Telehealth: Payer: Self-pay

## 2020-10-15 NOTE — Telephone Encounter (Signed)
Spoke with patient informed of appt at [facility] on Tue Sep. 20th at 10:00. Patient understood. Aquilla Solian, CMA

## 2020-10-23 ENCOUNTER — Ambulatory Visit
Admission: RE | Admit: 2020-10-23 | Discharge: 2020-10-23 | Disposition: A | Discharge status: Home / Self Care | Payer: Medicare PPO | Source: Ambulatory Visit | Attending: Family Medicine | Admitting: Family Medicine

## 2020-10-23 DIAGNOSIS — R519 Headache, unspecified: Secondary | ICD-10-CM | POA: Diagnosis not present

## 2020-10-23 DIAGNOSIS — G44309 Post-traumatic headache, unspecified, not intractable: Secondary | ICD-10-CM

## 2020-10-25 ENCOUNTER — Encounter: Payer: Self-pay | Admitting: Family Medicine

## 2020-11-19 DIAGNOSIS — E039 Hypothyroidism, unspecified: Secondary | ICD-10-CM | POA: Diagnosis not present

## 2020-11-19 DIAGNOSIS — I739 Peripheral vascular disease, unspecified: Secondary | ICD-10-CM | POA: Diagnosis not present

## 2020-11-19 DIAGNOSIS — K219 Gastro-esophageal reflux disease without esophagitis: Secondary | ICD-10-CM | POA: Diagnosis not present

## 2020-11-19 DIAGNOSIS — K5900 Constipation, unspecified: Secondary | ICD-10-CM | POA: Diagnosis not present

## 2020-11-19 DIAGNOSIS — Z800 Family history of malignant neoplasm of digestive organs: Secondary | ICD-10-CM | POA: Diagnosis not present

## 2020-11-19 DIAGNOSIS — I10 Essential (primary) hypertension: Secondary | ICD-10-CM | POA: Diagnosis not present

## 2020-11-19 DIAGNOSIS — M1990 Unspecified osteoarthritis, unspecified site: Secondary | ICD-10-CM | POA: Diagnosis not present

## 2020-11-19 DIAGNOSIS — Z801 Family history of malignant neoplasm of trachea, bronchus and lung: Secondary | ICD-10-CM | POA: Diagnosis not present

## 2020-11-19 DIAGNOSIS — Z8051 Family history of malignant neoplasm of kidney: Secondary | ICD-10-CM | POA: Diagnosis not present

## 2020-11-29 IMAGING — DX DG WRIST COMPLETE 3+V*R*
4 series · 4 of 4 positions shown · non-contrast
Comparison: None.

CLINICAL DATA: Right wrist pain and swelling after fall yesterday.

EXAM:
RIGHT WRIST - COMPLETE 3+ VIEW

[wrist pa]
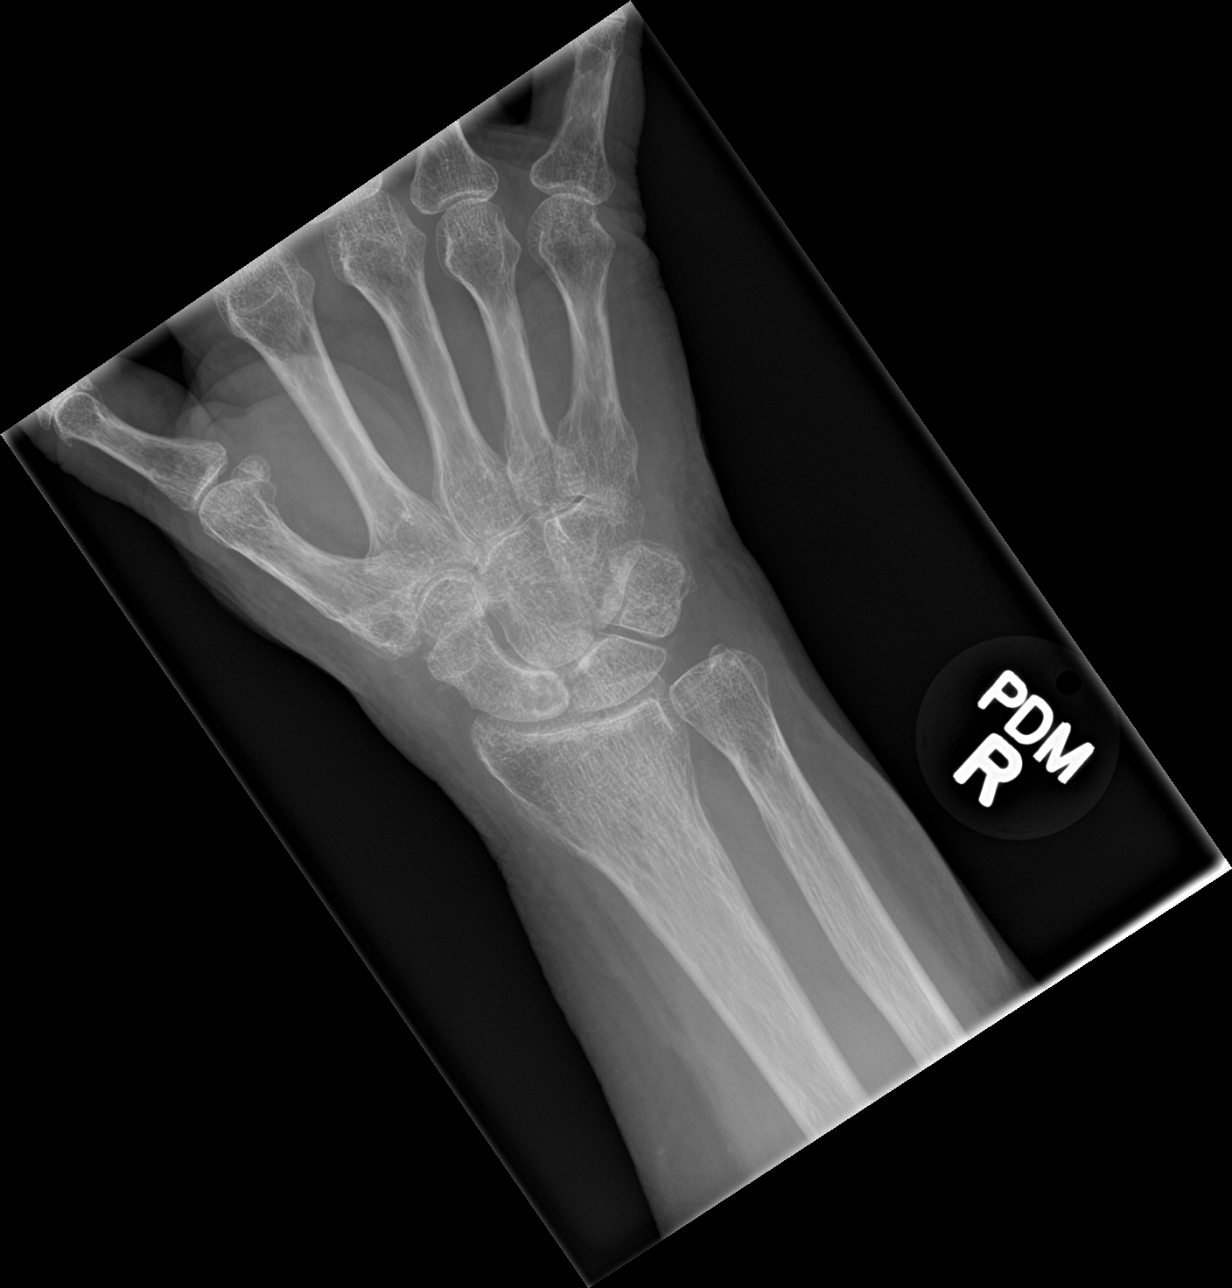

[wrist obl]
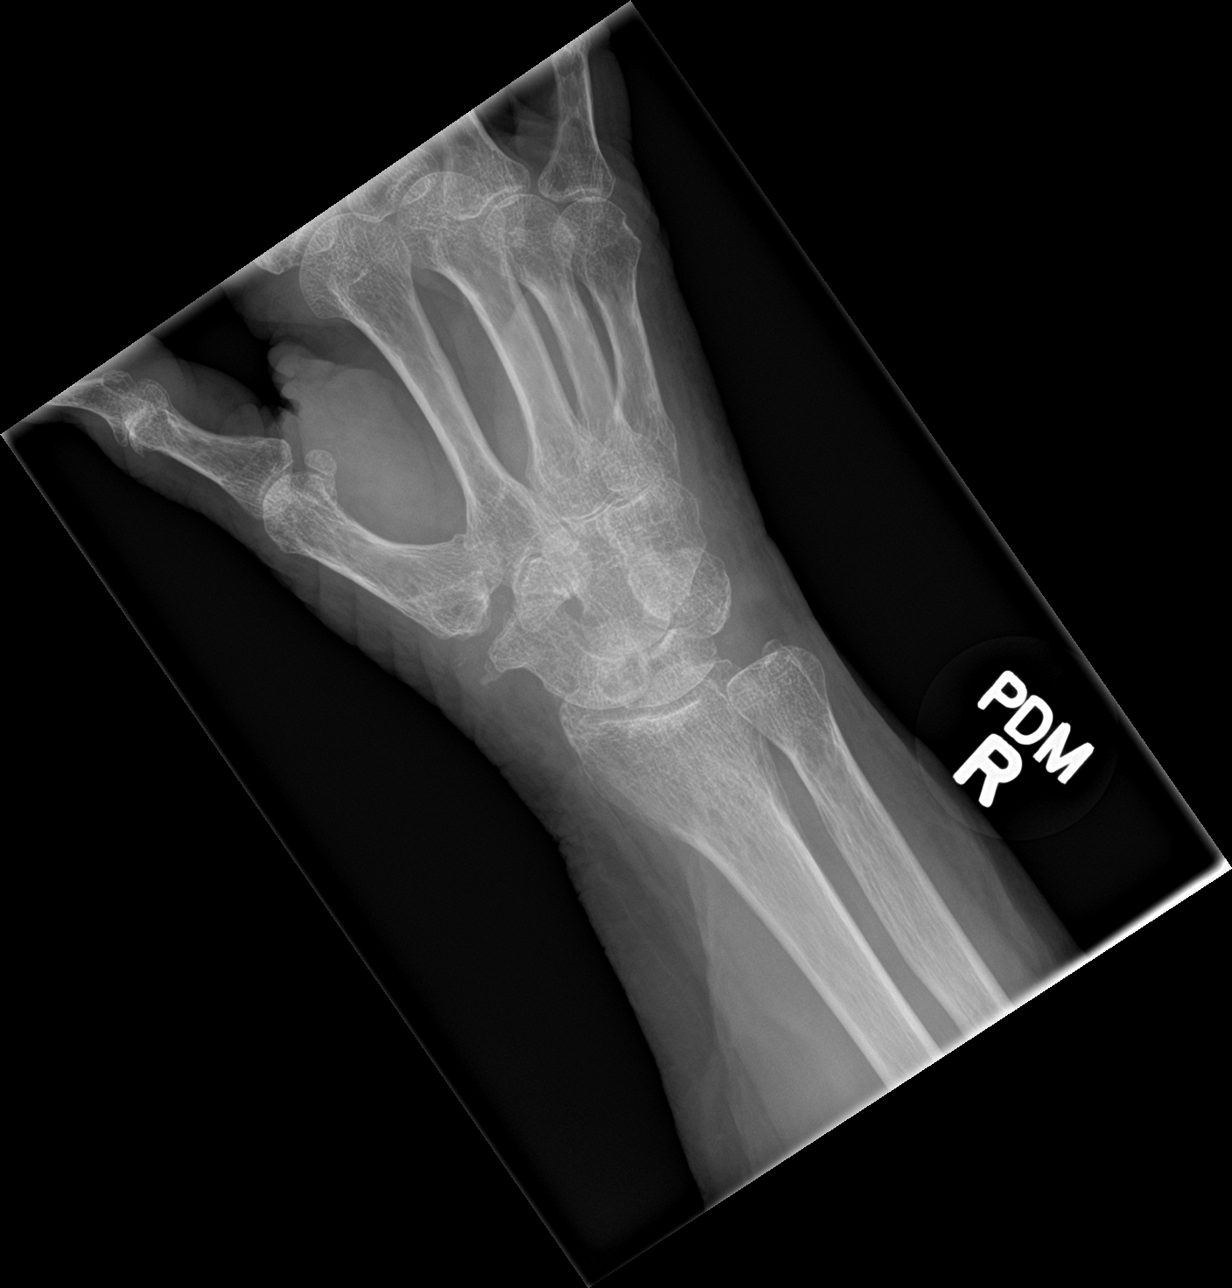

[wrist lat]
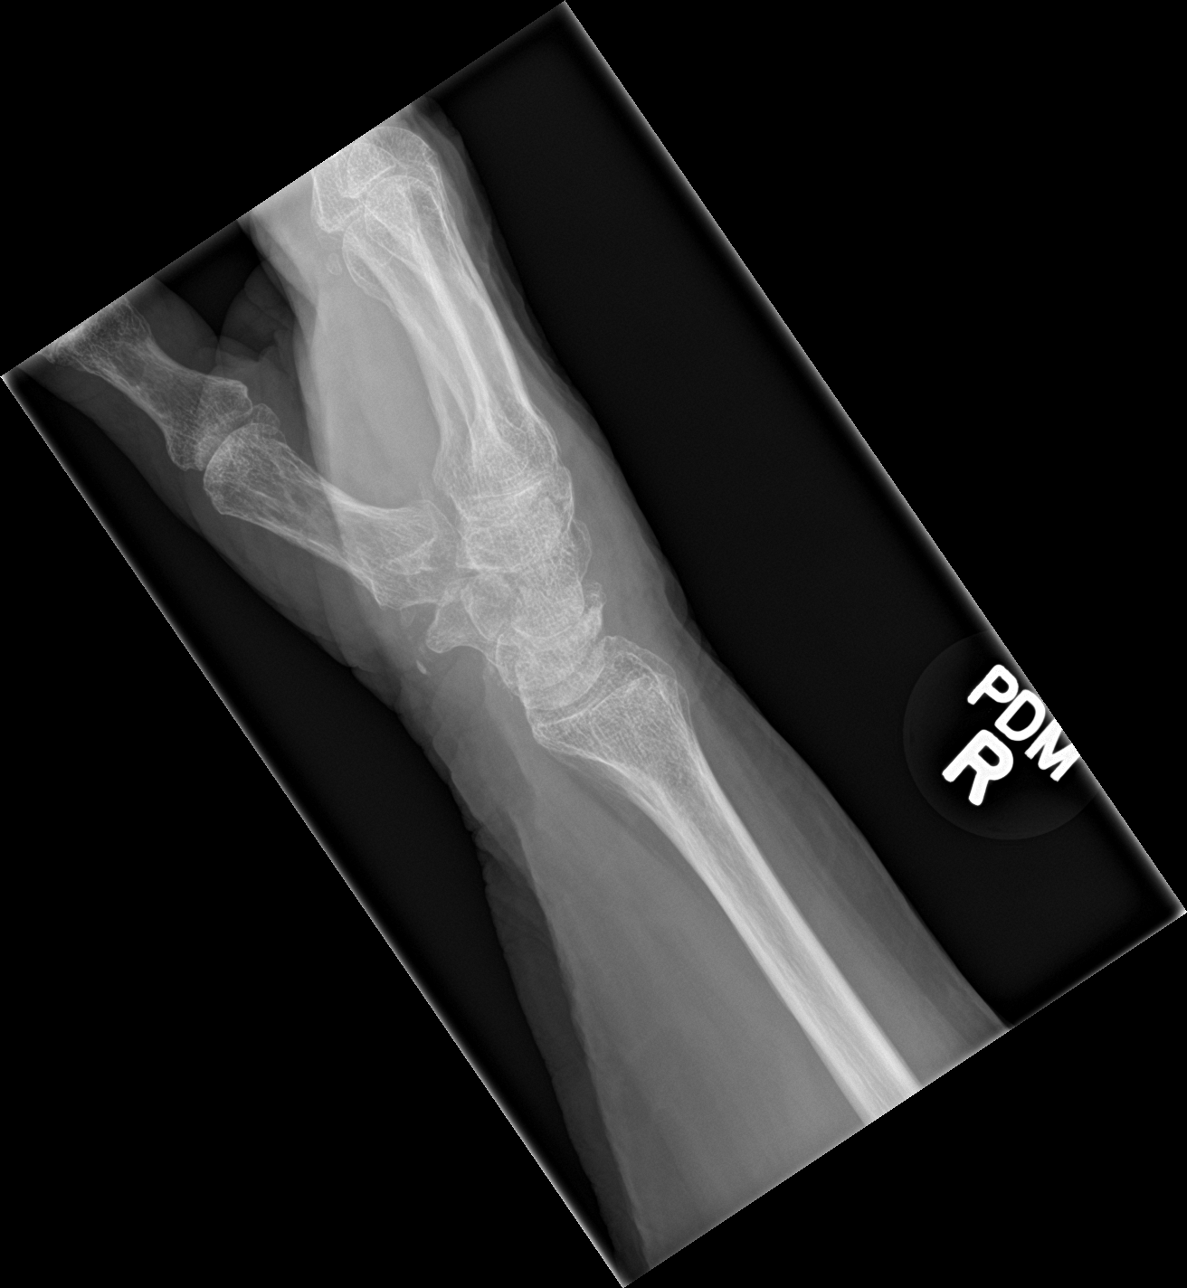

[navicular]
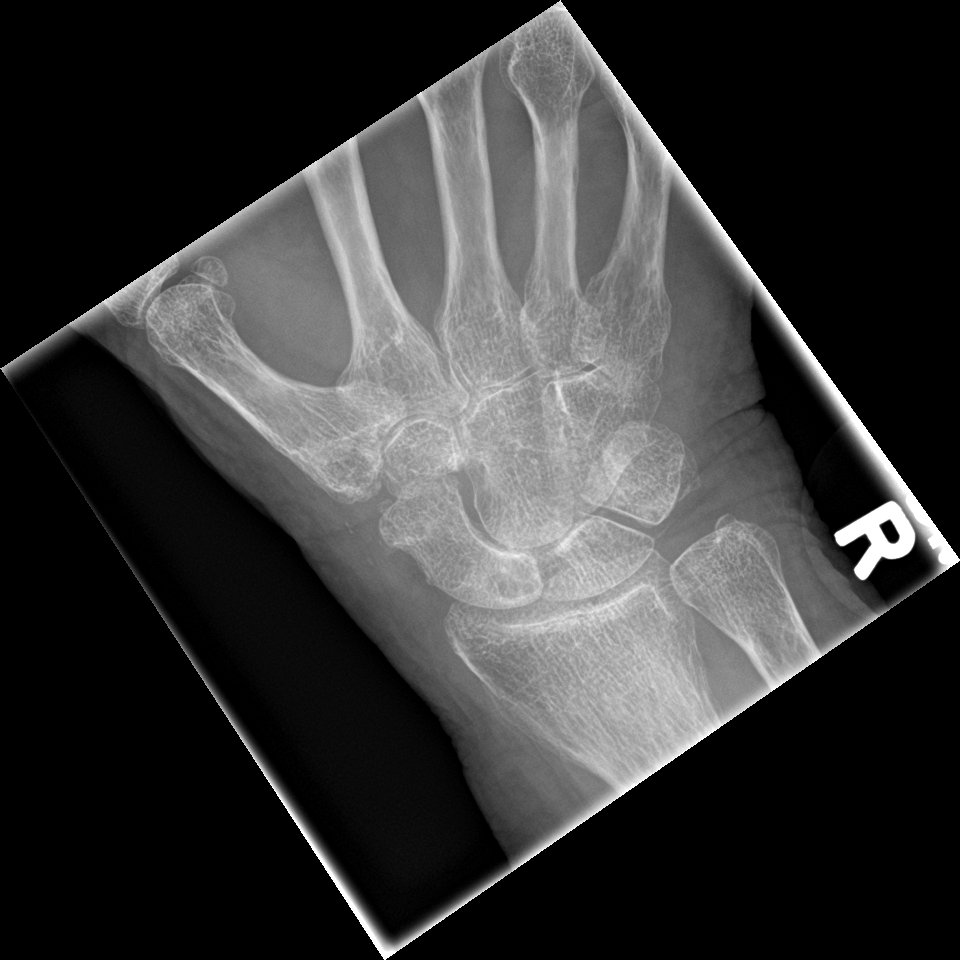

[4 of 4 positions shown; findings below may reference images not displayed]

FINDINGS: There is no evidence of fracture or dislocation. Narrowing of
several intercarpal and carpometacarpal joints is noted. Soft
tissues are unremarkable.
IMPRESSION: Degenerative changes as described above. No acute abnormality seen
in the right wrist.

## 2021-01-02 DIAGNOSIS — H401112 Primary open-angle glaucoma, right eye, moderate stage: Secondary | ICD-10-CM | POA: Diagnosis not present

## 2021-01-02 DIAGNOSIS — D3132 Benign neoplasm of left choroid: Secondary | ICD-10-CM | POA: Diagnosis not present

## 2021-01-02 DIAGNOSIS — Z961 Presence of intraocular lens: Secondary | ICD-10-CM | POA: Diagnosis not present

## 2021-01-02 DIAGNOSIS — H524 Presbyopia: Secondary | ICD-10-CM | POA: Diagnosis not present

## 2021-01-03 DIAGNOSIS — H5711 Ocular pain, right eye: Secondary | ICD-10-CM | POA: Diagnosis not present

## 2021-01-21 DIAGNOSIS — H401112 Primary open-angle glaucoma, right eye, moderate stage: Secondary | ICD-10-CM | POA: Diagnosis not present

## 2021-02-07 ENCOUNTER — Other Ambulatory Visit: Payer: Self-pay | Admitting: Family Medicine

## 2021-02-07 ENCOUNTER — Encounter: Payer: Self-pay | Admitting: Family Medicine

## 2021-02-07 MED ORDER — PAXLOVID (150/100) 10 X 150 MG & 10 X 100MG PO TBPK: 1.0000 | ORAL_TABLET | Freq: Two times a day (BID) | ORAL | 0 refills | Status: DC

## 2021-02-07 NOTE — Progress Notes (Signed)
Dear [facility] Team  I sent her a My Chart message but make sure she knows to read it re: I sent in his Paxlovid for COVID.  I have listed the meds she should HOLD or STOP whiole taking the Paxlovid

## 2021-03-11 DIAGNOSIS — E039 Hypothyroidism, unspecified: Secondary | ICD-10-CM | POA: Diagnosis not present

## 2021-03-11 DIAGNOSIS — Z882 Allergy status to sulfonamides status: Secondary | ICD-10-CM | POA: Diagnosis not present

## 2021-03-11 DIAGNOSIS — Z7722 Contact with and (suspected) exposure to environmental tobacco smoke (acute) (chronic): Secondary | ICD-10-CM | POA: Diagnosis not present

## 2021-03-11 DIAGNOSIS — Z880 Allergy status to penicillin: Secondary | ICD-10-CM | POA: Diagnosis not present

## 2021-03-11 DIAGNOSIS — K219 Gastro-esophageal reflux disease without esophagitis: Secondary | ICD-10-CM | POA: Diagnosis not present

## 2021-03-11 DIAGNOSIS — Z809 Family history of malignant neoplasm, unspecified: Secondary | ICD-10-CM | POA: Diagnosis not present

## 2021-03-11 DIAGNOSIS — M1990 Unspecified osteoarthritis, unspecified site: Secondary | ICD-10-CM | POA: Diagnosis not present

## 2021-03-11 DIAGNOSIS — Z87891 Personal history of nicotine dependence: Secondary | ICD-10-CM | POA: Diagnosis not present

## 2021-03-11 DIAGNOSIS — I10 Essential (primary) hypertension: Secondary | ICD-10-CM | POA: Diagnosis not present

## 2021-03-20 DIAGNOSIS — Z961 Presence of intraocular lens: Secondary | ICD-10-CM | POA: Diagnosis not present

## 2021-03-20 DIAGNOSIS — H35363 Drusen (degenerative) of macula, bilateral: Secondary | ICD-10-CM | POA: Diagnosis not present

## 2021-03-20 DIAGNOSIS — H353131 Nonexudative age-related macular degeneration, bilateral, early dry stage: Secondary | ICD-10-CM | POA: Diagnosis not present

## 2021-03-20 DIAGNOSIS — D3132 Benign neoplasm of left choroid: Secondary | ICD-10-CM | POA: Diagnosis not present

## 2021-03-20 DIAGNOSIS — H35371 Puckering of macula, right eye: Secondary | ICD-10-CM | POA: Diagnosis not present

## 2021-03-20 DIAGNOSIS — H35453 Secondary pigmentary degeneration, bilateral: Secondary | ICD-10-CM | POA: Diagnosis not present

## 2021-03-25 IMAGING — CT CT ANGIO CHEST-ABD-PELV FOR DISSECTION W/ AND WO/W CM
2 of 7 series · 14 of 46 positions shown, 16 images · IV contrast (omnipaque)
Comparison: None.

CLINICAL DATA: Chest pain radiating to the back with shortness of
breath

EXAM:
CT ANGIOGRAPHY CHEST, ABDOMEN AND PELVIS
TECHNIQUE: Multidetector CT imaging through the chest, abdomen and pelvis was
performed using the standard protocol during bolus administration of
intravenous contrast. Multiplanar reconstructed images and MIPs were
obtained and reviewed to evaluate the vascular anatomy.
CONTRAST:  100mL OMNIPAQUE IOHEXOL 350 MG/ML SOLN

[Series 7: dissection 2mm · axial · 0.83mm/px · z∈[+807,+1335]mm · 11 of 298 slices shown, 13 images]
[im 17/298  soft-tissue]
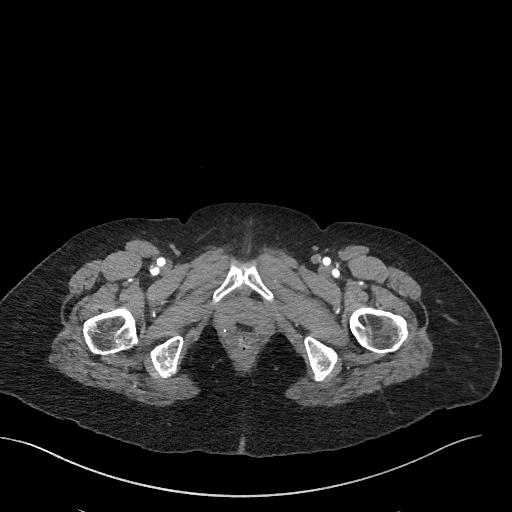
[im 17/298  bone]
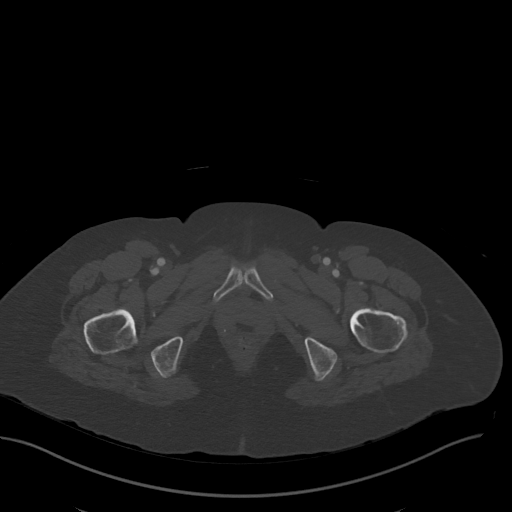
[im 50/298  soft-tissue]
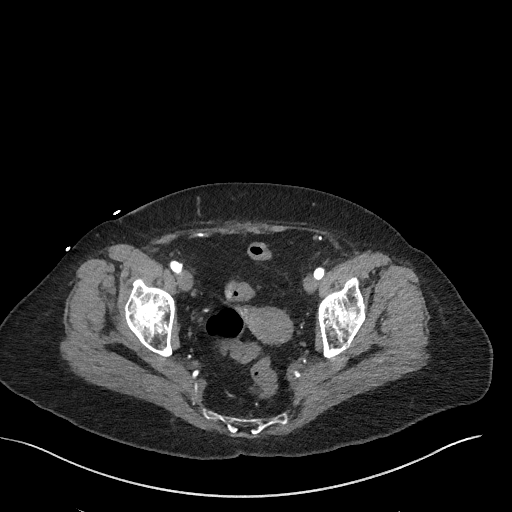
[im 67/298  soft-tissue]
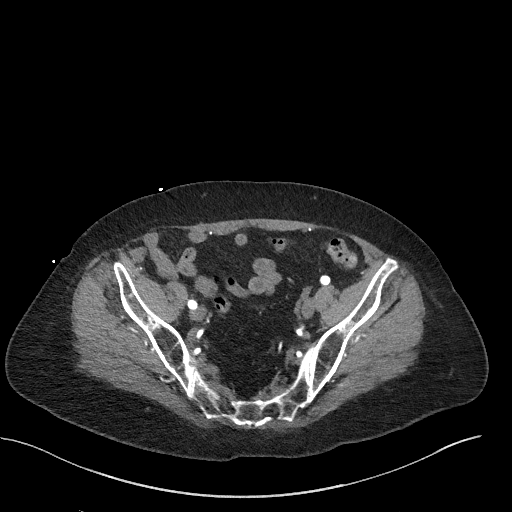
[im 100/298  soft-tissue]
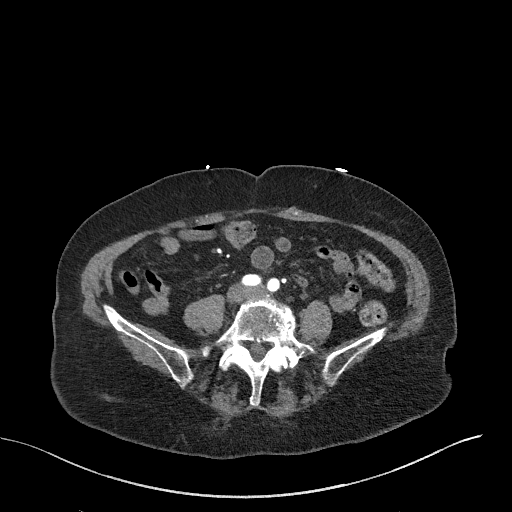
[im 116/298  soft-tissue]
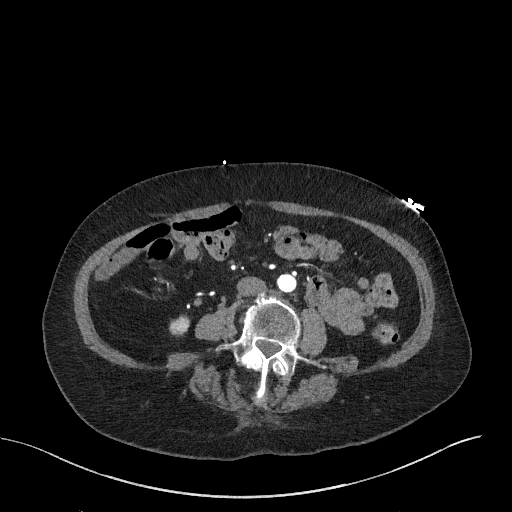
[im 149/298  soft-tissue]
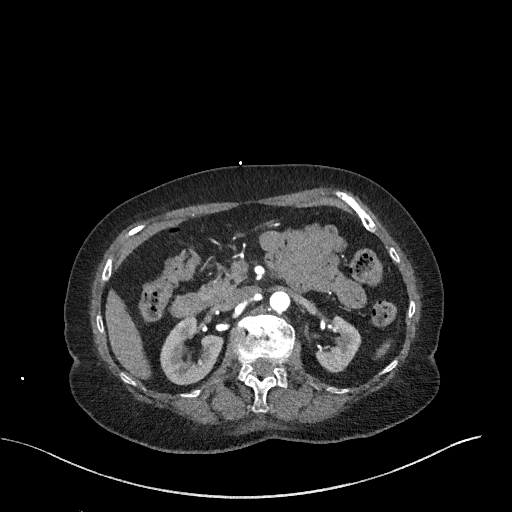
[im 182/298  soft-tissue]
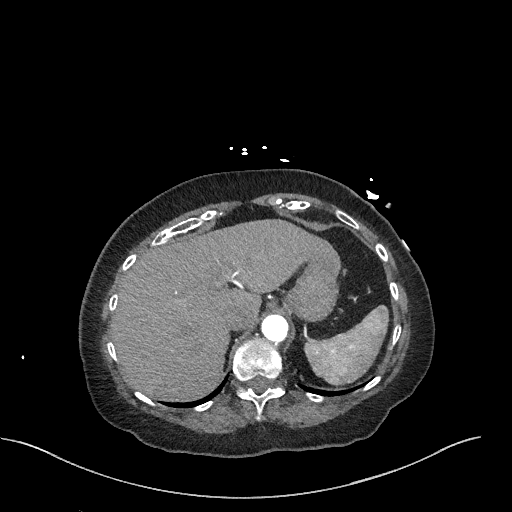
[im 199/298  soft-tissue]
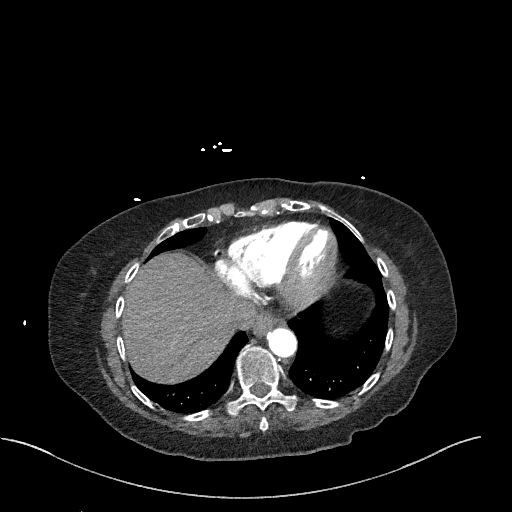
[im 232/298  soft-tissue]
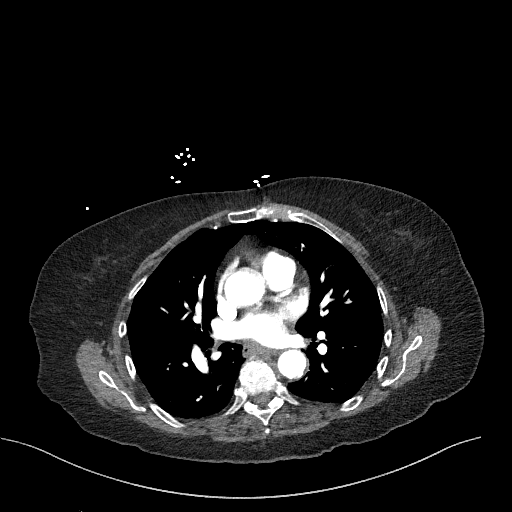
[im 232/298  bone]
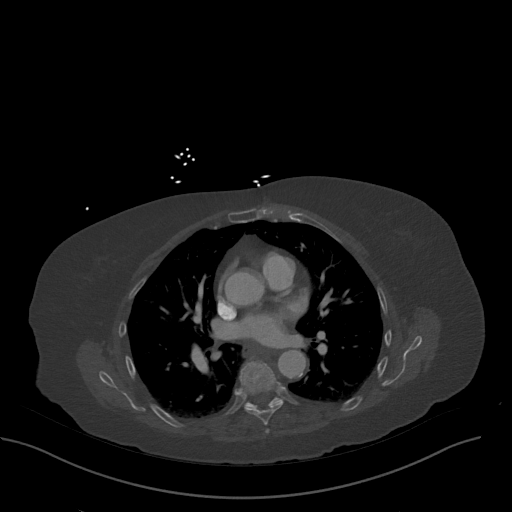
[im 248/298  soft-tissue]
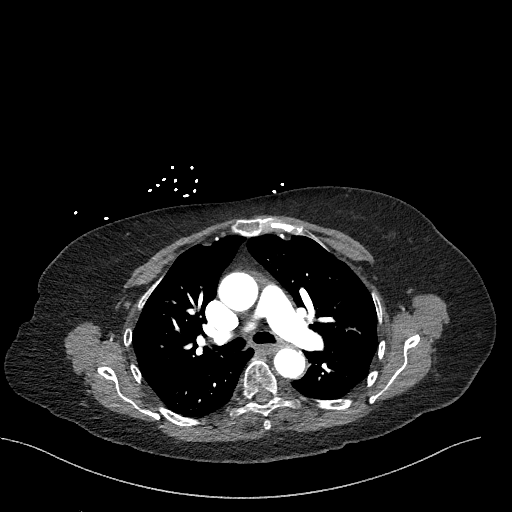
[im 281/298  soft-tissue]
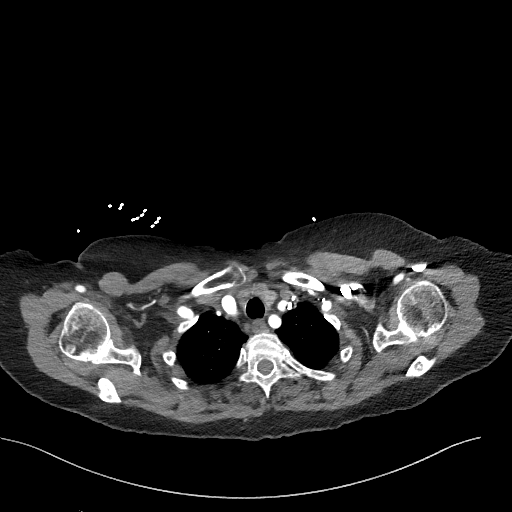

[Series 10: dissection 2mm cor · coronal · 0.67mm/px · 3 of 126 slices shown]
[im 32/126  soft-tissue]
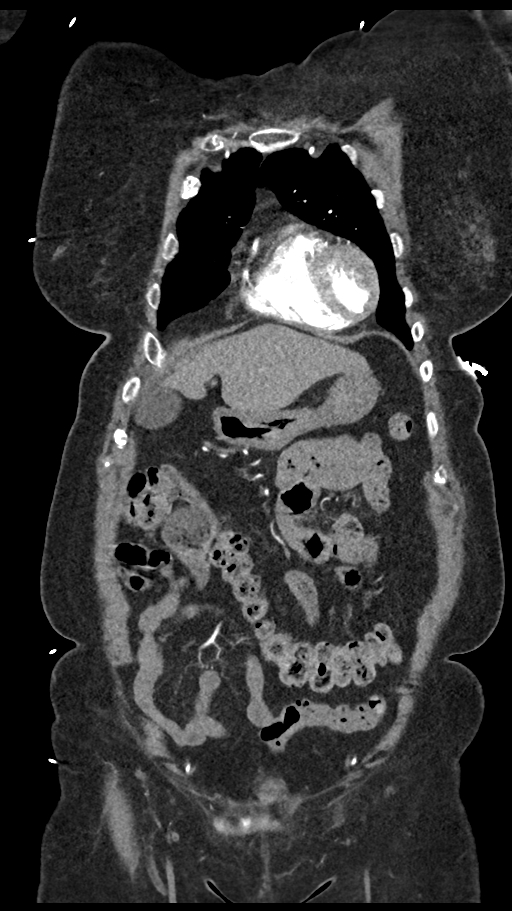
[im 63/126  soft-tissue]
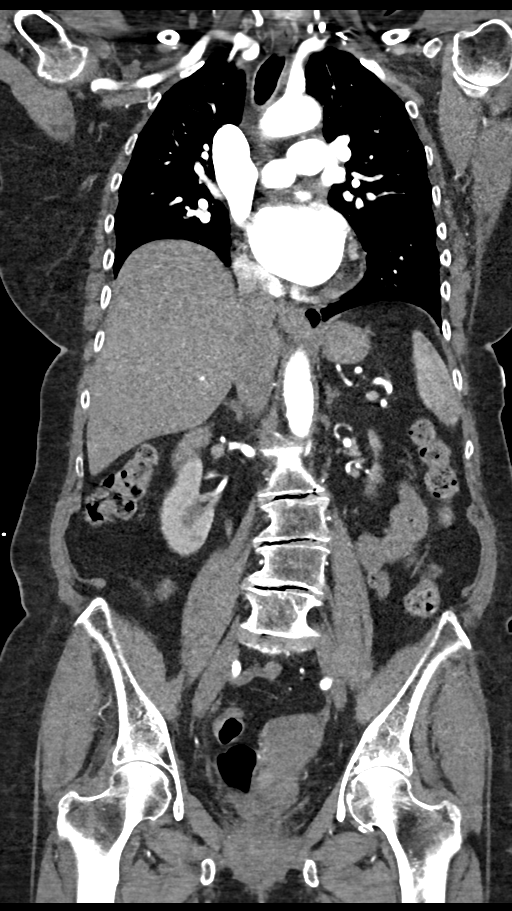
[im 94/126  soft-tissue]
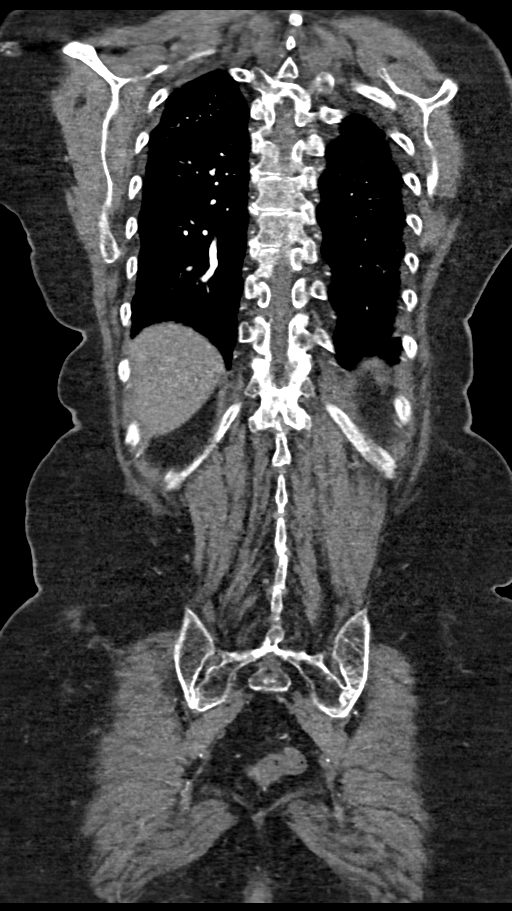

[14 of 46 positions shown; findings below may reference images not displayed]

FINDINGS: CTA CHEST FINDINGS

Cardiovascular: No intramural aortic hematoma. Mild for age
atherosclerotic plaque of the aorta without dissection or aneurysm.
Normal heart size. No pulmonary artery filling defect. No
pericardial effusion.

Mediastinum/Nodes: Negative for adenopathy.

Lungs/Pleura: Mild mosaic attenuation of the lungs. There is no
edema, consolidation, effusion, or pneumothorax.

Musculoskeletal: No acute or aggressive finding. Bilateral
glenohumeral osteoarthritis

Review of the MIP images confirms the above findings.

CTA ABDOMEN AND PELVIS FINDINGS

VASCULAR

Aorta: Atherosclerotic plaque.  No dissection or aneurysm.

Celiac: Widely patent with smooth branches

SMA: Smooth and widely patent.

Renals: Atheromatous plaque at the bilateral ostia without stenosis.
No dissection or aneurysm.

IMA: Patent

Inflow: Patent

Veins: Unremarkable in the arterial phase

Review of the MIP images confirms the above findings.

NON-VASCULAR

Hepatobiliary: No focal liver abnormality.No evidence of biliary
obstruction or stone.

Pancreas: Unremarkable.

Spleen: Unremarkable.

Adrenals/Urinary Tract: Negative adrenals. No hydronephrosis or
stone. Unremarkable bladder.

Stomach/Bowel:  No obstruction. Minimal colonic diverticulosis.

Lymphatic: No mass or adenopathy.

Reproductive:No pathologic findings.

Other: No ascites or pneumoperitoneum.

Musculoskeletal: No acute abnormalities.

Review of the MIP images confirms the above findings.
IMPRESSION: 1. No evidence of acute aortic syndrome. No specific explanation for
pain.
2. Mild for age atherosclerosis.

## 2021-03-25 IMAGING — DX PORTABLE CHEST - 1 VIEW
1 series · 1 of 1 positions shown · non-contrast
Comparison: Prior radiograph from 08/11/2014.

CLINICAL DATA: Initial evaluation for acute chest pain, shortness
of breath.

EXAM:
PORTABLE CHEST 1 VIEW

[chest ap]
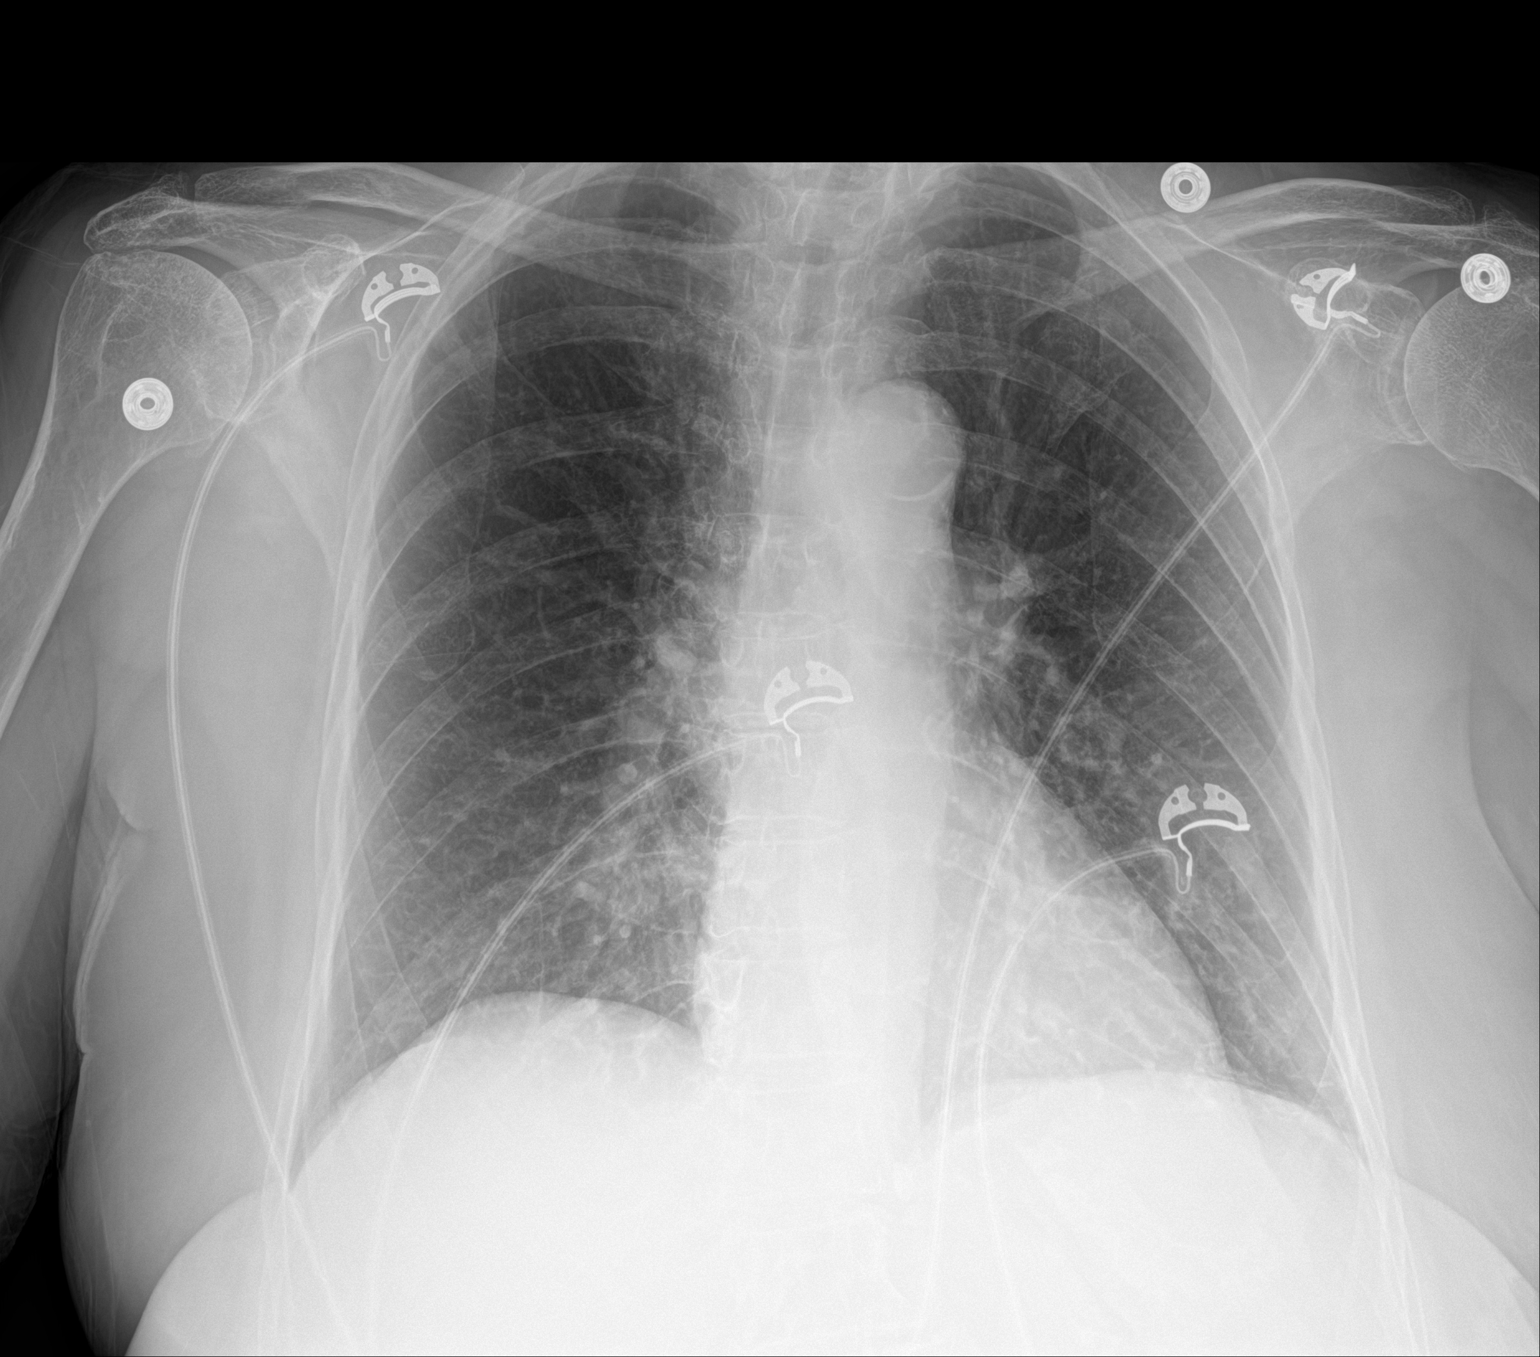

[1 of 1 positions shown; findings below may reference images not displayed]

FINDINGS: Transverse heart size within normal limits. Mediastinal silhouette
normal. Aortic atherosclerosis.

Lungs mildly hypoinflated. Associated mild subsegmental left basilar
atelectasis. No focal infiltrates. No pulmonary edema or pleural
effusion. No pneumothorax.

No acute osseous finding.  Osteopenia.
IMPRESSION: 1. Low lung volumes with associated mild left basilar subsegmental
atelectasis.
2. No other active cardiopulmonary disease.

## 2021-04-24 ENCOUNTER — Other Ambulatory Visit: Payer: Self-pay

## 2021-04-25 MED ORDER — AMLODIPINE BESYLATE 2.5 MG PO TABS: ORAL_TABLET | ORAL | 3 refills | Status: DC

## 2021-04-29 DIAGNOSIS — H401112 Primary open-angle glaucoma, right eye, moderate stage: Secondary | ICD-10-CM | POA: Diagnosis not present

## 2021-05-09 ENCOUNTER — Ambulatory Visit: Payer: Medicare PPO | Admitting: Family Medicine

## 2021-05-09 VITALS — BP 116/76 | HR 72 | Wt 150.0 lb

## 2021-05-09 DIAGNOSIS — J029 Acute pharyngitis, unspecified: Secondary | ICD-10-CM | POA: Diagnosis not present

## 2021-05-09 LAB — POCT RAPID STREP A (OFFICE): Rapid Strep A Screen: NEGATIVE

## 2021-05-09 NOTE — Patient Instructions (Signed)
It was wonderful to see you today. Thank you for allowing me to be a part of your care. Below is a short summary of what we discussed at your visit today: ? ?Sore throat ?Today we swabbed you for strep throat. If the results are negative, I will send you a letter or MyChart message. If the results are abnormal, I will give you a call and prescribe medication.  ? ?Given your constellation of symptoms, I favor this being environmental allergies.  ? ?Start flonase, 1 spray each nostril, at bedtime.  ? ?You may also take an over the counter allergy medication daily. Good choices for you would be Claritin or Allegra (or their generic formulations).  ? ?Reasons to come back for further care and work up: ?Developed fever above 100.4 ?F ?Uncontrollable nausea, vomiting, diarrhea ?Inability to keep down liquids ?Decreased urine output ?Difficulty breathing or development of chest congestion ? ? ?If you have any questions or concerns, please do not hesitate to contact us via phone or MyChart message.  ? ?Fayette Pho, MD  ? ? ?Cooking and Nutrition Classes ?[employer] in [place] provides many classes at low or no cost to [employer], nutrition, and agriculture.  Their website offers a huge variety of information related to topics such as gardening, nutrition, cooking, parenting, and health.  Also listed are classes and events, both online and in-person.  Check out their website here: [email or web address]://[place].[employer].nl  ? ?Therapy Resources ?Go to https://www.psychologytoday.com/us. Search for [place], [place] (or city of your choice).  On the next page, you will see filter options at the top.  You may filter therapists by insurance they take, issues you would like to work on, or therapy types.  You can even search by therapist age, gender, ethnicity, and religion.  ? ? ?

## 2021-05-09 NOTE — Progress Notes (Signed)
? ? ?  SUBJECTIVE:  ? ?CHIEF COMPLAINT / HPI:  ? ?Sore throat, dry cough, rhinorrhea ?Patient presents with 2-day history of sore throat, dry cough, rhinorrhea, nausea, headache, stuffy ear nose, and joint aches.  She is concerned about strep throat as her grandchildren just yesterday tested positive for strep throat.  She denies shortness of breath, wheezing, rash, vomiting, diarrhea, constipation.  She is fully vaccinated for flu and COVID, including a bivalent COVID booster. ? ?PERTINENT  PMH / PSH: HTN, migraine with aura, GERD, hypothyroidism ? ?OBJECTIVE:  ? ?BP 116/76   Pulse 72   Wt 150 lb (68 kg)   SpO2 96%   BMI 27.44 kg/m?   ? ?PHQ-9:  ? ?  10/10/2020  ? 11:30 AM 07/25/2020  ? 10:17 AM 06/13/2020  ?  9:44 AM  ?Depression screen PHQ 2/9  ?Decreased Interest 0 0 0  ?Down, Depressed, Hopeless 0 0 0  ?PHQ - 2 Score 0 0 0  ?Altered sleeping 0 0 0  ?Tired, decreased energy 0 0 0  ?Change in appetite 0 0 0  ?Feeling bad or failure about yourself  0 0 0  ?Trouble concentrating 0 0 0  ?Moving slowly or fidgety/restless 0 0 0  ?Suicidal thoughts 0 0 0  ?PHQ-9 Score 0 0 0  ?  ?Physical Exam ?General: Awake, alert, oriented ?HEENT: PERRL, bilateral TM pearly pink and flat, bilateral external auditory canals with minimal cerumen burden, no lesions, nasal mucosa slightly edematous, oral mucosa pink, moist, without lesion, intact dentition without obvious cavity ?Lymph: Shoddy lymphedema of anterior neck ?Cardiovascular: Regular rate and rhythm, S1 and S2 present, no murmurs auscultated ?Respiratory: Lung fields clear to auscultation bilaterally ? ?ASSESSMENT/PLAN:  ? ?Sore throat ?Acute, associated with dry cough and constellation of other symptoms.  Rapid strep test negative today.  Differential includes seasonal allergies, nonspecific viral URI, and COVID versus flu.  Given lack of fever and malaise, less likely to be infectious.  Highly suspect seasonal allergies.  Recommend Flonase and daily antihistamine such as  Allegra or Claritin.  Return precautions given, see AVS for more.   ?  ? ? ?Ezequiel Essex, MD ?[place]  ?

## 2021-05-09 NOTE — Assessment & Plan Note (Signed)
Acute, associated with dry cough and constellation of other symptoms.  Rapid strep test negative today.  Differential includes seasonal allergies, nonspecific viral URI, and COVID versus flu.  Given lack of fever and malaise, less likely to be infectious.  Highly suspect seasonal allergies.  Recommend Flonase and daily antihistamine such as Allegra or Claritin.  Return precautions given, see AVS for more.   ?

## 2021-05-14 DIAGNOSIS — Z20822 Contact with and (suspected) exposure to covid-19: Secondary | ICD-10-CM | POA: Diagnosis not present

## 2021-05-14 DIAGNOSIS — Z961 Presence of intraocular lens: Secondary | ICD-10-CM | POA: Diagnosis not present

## 2021-05-14 DIAGNOSIS — H35362 Drusen (degenerative) of macula, left eye: Secondary | ICD-10-CM | POA: Diagnosis not present

## 2021-05-14 DIAGNOSIS — H318 Other specified disorders of choroid: Secondary | ICD-10-CM | POA: Diagnosis not present

## 2021-05-14 DIAGNOSIS — Z03818 Encounter for observation for suspected exposure to other biological agents ruled out: Secondary | ICD-10-CM | POA: Diagnosis not present

## 2021-05-14 DIAGNOSIS — J029 Acute pharyngitis, unspecified: Secondary | ICD-10-CM | POA: Diagnosis not present

## 2021-05-14 DIAGNOSIS — H16143 Punctate keratitis, bilateral: Secondary | ICD-10-CM | POA: Diagnosis not present

## 2021-05-14 DIAGNOSIS — J309 Allergic rhinitis, unspecified: Secondary | ICD-10-CM | POA: Diagnosis not present

## 2021-05-14 DIAGNOSIS — H43811 Vitreous degeneration, right eye: Secondary | ICD-10-CM | POA: Diagnosis not present

## 2021-06-19 ENCOUNTER — Ambulatory Visit: Payer: Medicare PPO | Admitting: Family Medicine

## 2021-06-26 ENCOUNTER — Encounter: Payer: Self-pay | Admitting: Family Medicine

## 2021-06-26 ENCOUNTER — Ambulatory Visit: Payer: Medicare PPO | Admitting: Family Medicine

## 2021-06-26 ENCOUNTER — Ambulatory Visit (INDEPENDENT_AMBULATORY_CARE_PROVIDER_SITE_OTHER): Payer: Medicare PPO

## 2021-06-26 VITALS — BP 152/80 | HR 62 | Ht 62.0 in | Wt 149.4 lb

## 2021-06-26 DIAGNOSIS — Z0000 Encounter for general adult medical examination without abnormal findings: Secondary | ICD-10-CM | POA: Diagnosis not present

## 2021-06-26 DIAGNOSIS — Z23 Encounter for immunization: Secondary | ICD-10-CM | POA: Diagnosis not present

## 2021-06-26 NOTE — Patient Instructions (Signed)
You look great.  I am not concerned about the headaches.  You could try a different pillow.  Usually continue to bother you, let me know.  We can certainly reimage your hip but I do not think there is anything intracranial going on.  I do think this is a small scalp nerve that is irritated.  Great to see you!

## 2021-06-27 NOTE — Progress Notes (Signed)
    CHIEF COMPLAINT / HPI: Here for annual checkup and discussion of headaches.  She has long history of migraines and those have not really changed.  For the last 1 to 1-1/2 years she has had a different type of headache that starts with a very tender area on the left side of her head.  If she lays down on that side at night she will get a headache.  She wears a hat around that area that is tight, she will get a headache.  She is most concerned because both her brother and her father had to have brain surgery when they were older in their 24s or 49s or 53s.  Her migraine symptoms are unchanged and they seem unrelated to this type of headache.  Otherwise she is doing very well.   PERTINENT  PMH / PSH: I have reviewed the patient's medications, allergies, past medical and surgical history, smoking status and updated in the EMR as appropriate.   OBJECTIVE:  BP (!) 152/80   Pulse 62   Ht 5\' 2"  (1.575 m)   Wt 149 lb 6.4 oz (67.8 kg)   SpO2 99%   BMI 27.33 kg/m  Vital signs reviewed. GENERAL: Well-developed, well-nourished, no acute distress. CARDIOVASCULAR: Regular rate and rhythm no murmur gallop or rub LUNGS: Clear to auscultation bilaterally, no rales or wheeze. ABDOMEN: Soft positive bowel sounds NEURO: No gross focal neurological deficits. MSK: Movement of extremity x 4. SKULL: Area of tenderness is without any sign of defect, the skin around this area is normal. HEENT: Pupils equal round reactive to light extraocular muscles are intact.  ASSESSMENT / PLAN:  #1.  Well adult physical.  Update her immunizations.  No labs needed today 2.  Chronic migraine unchanged 3.  New type of headache related to pressure on the left side of her head.  Intermittent.  I do think this is related to some type of scalp nerve irritation since its worse if she lays on that side or if she wears a tight hat.  I think she is mostly concerned because her family members had brain surgery at older age.  I did  review with her that her CT scan in September 2022 was totally normal.  Hopefully this will reassure her.  If not, I would be willing to reimage her.  She does not want to do MRI if she can avoid it because of claustrophobia.  I think she is reassured. No problem-specific Assessment & Plan notes found for this encounter.   October 2022 MD

## 2021-07-09 ENCOUNTER — Encounter: Payer: Self-pay | Admitting: *Deleted

## 2021-08-12 ENCOUNTER — Other Ambulatory Visit: Payer: Self-pay | Admitting: *Deleted

## 2021-08-12 DIAGNOSIS — E034 Atrophy of thyroid (acquired): Secondary | ICD-10-CM

## 2021-08-14 MED ORDER — LEVOTHYROXINE SODIUM 75 MCG PO TABS: ORAL_TABLET | ORAL | 3 refills | Status: DC

## 2021-09-04 DIAGNOSIS — H401112 Primary open-angle glaucoma, right eye, moderate stage: Secondary | ICD-10-CM | POA: Diagnosis not present

## 2021-09-04 DIAGNOSIS — D3132 Benign neoplasm of left choroid: Secondary | ICD-10-CM | POA: Diagnosis not present

## 2021-09-04 DIAGNOSIS — Z961 Presence of intraocular lens: Secondary | ICD-10-CM | POA: Diagnosis not present

## 2021-09-26 DIAGNOSIS — H40051 Ocular hypertension, right eye: Secondary | ICD-10-CM | POA: Diagnosis not present

## 2021-09-26 DIAGNOSIS — H353131 Nonexudative age-related macular degeneration, bilateral, early dry stage: Secondary | ICD-10-CM | POA: Diagnosis not present

## 2021-09-26 DIAGNOSIS — D3132 Benign neoplasm of left choroid: Secondary | ICD-10-CM | POA: Diagnosis not present

## 2021-09-26 DIAGNOSIS — H35363 Drusen (degenerative) of macula, bilateral: Secondary | ICD-10-CM | POA: Diagnosis not present

## 2021-09-26 DIAGNOSIS — H35371 Puckering of macula, right eye: Secondary | ICD-10-CM | POA: Diagnosis not present

## 2021-09-26 DIAGNOSIS — H35453 Secondary pigmentary degeneration, bilateral: Secondary | ICD-10-CM | POA: Diagnosis not present

## 2021-10-03 DIAGNOSIS — R194 Change in bowel habit: Secondary | ICD-10-CM | POA: Diagnosis not present

## 2021-10-03 DIAGNOSIS — R1032 Left lower quadrant pain: Secondary | ICD-10-CM | POA: Diagnosis not present

## 2021-10-03 DIAGNOSIS — Z86010 Personal history of colonic polyps: Secondary | ICD-10-CM | POA: Diagnosis not present

## 2021-10-03 DIAGNOSIS — K219 Gastro-esophageal reflux disease without esophagitis: Secondary | ICD-10-CM | POA: Diagnosis not present

## 2021-10-03 DIAGNOSIS — Z1211 Encounter for screening for malignant neoplasm of colon: Secondary | ICD-10-CM | POA: Diagnosis not present

## 2021-10-03 DIAGNOSIS — R140 Abdominal distension (gaseous): Secondary | ICD-10-CM | POA: Diagnosis not present

## 2021-10-09 ENCOUNTER — Encounter: Payer: Self-pay | Admitting: Family Medicine

## 2021-10-09 ENCOUNTER — Ambulatory Visit: Payer: Medicare PPO | Admitting: Family Medicine

## 2021-10-09 VITALS — BP 160/70 | HR 66 | Ht 62.0 in | Wt 149.4 lb

## 2021-10-09 DIAGNOSIS — R1012 Left upper quadrant pain: Secondary | ICD-10-CM

## 2021-10-09 DIAGNOSIS — R109 Unspecified abdominal pain: Secondary | ICD-10-CM | POA: Diagnosis not present

## 2021-10-09 MED ORDER — CYCLOBENZAPRINE HCL 5 MG PO TABS: 5.0000 mg | ORAL_TABLET | Freq: Every day | ORAL | 1 refills | Status: DC

## 2021-10-09 MED ORDER — TRAMADOL HCL 50 MG PO TABS: ORAL_TABLET | ORAL | 0 refills | Status: DC

## 2021-10-09 NOTE — Patient Instructions (Signed)
I have ordered a CT scan of your abdomen.  I will let you know results of that.  I am also getting some lab work on you.  I have given you tramadol 50 mg tabs.  Take 1 by mouth when you get home and see how that does for your pain.  If it is not making significant improvement, you can take an additional 1 an hour or 2 after that but then moving forward I would take 1 or 2 every 8 hours.  The maximum is 6 tabs in a 24-hour period.  I have also sent in a muscle relaxer, cyclobenzaprine.  You can take 1 at night before bedtime.  Be cautious that in combination with tramadol, you could become quite sedated.  Do not drive while taking these medicines.  If you have worsening symptoms or if you break out in a flank rash, call my office and let me know as soon as possible.

## 2021-10-09 NOTE — Progress Notes (Signed)
    CHIEF COMPLAINT / HPI:   2 weeks of left flank pain radiating to left upper quadrant.  Feels like it is deep inside, not necessarily a muscle.  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  Her sister has metastatic colon cancer that has affected her kidney and that is also worrying her.  She has not had any blood in her urine and no urinary symptoms.  The pain is left flank not really left CVA area.  PERTINENT  PMH / PSH: I have reviewed the patient's medications, allergies, past medical and surgical history, smoking status and updated in the EMR as appropriate.   OBJECTIVE:  BP (!) 160/70   Pulse 66   Ht 5\' 2"  (1.575 m)   Wt 149 lb 6.4 oz (67.8 kg)   SpO2 100%   BMI 27.33 kg/m  GENERAL: Well-developed female, seems to be in some discomfort. Back: Area of pain starts in the left flank area and she draws a line up to the left upper quadrant area.  There is no masses here.  The skin is without any type of rash.  She is not tender to palpation or percussion.  Trunk rotation does increase her pain some but not specifically in that area.  ASSESSMENT / PLAN: Left flank and upper quadrant pain: Differential is broad includes herpes zoster but there is no rash and this has been going on 2 weeks.  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  We will check creatinine today.  Set up for abdominal CT scan.  Pain medication see if we can get this under control and low-dose Flexeril for muscle relaxant.  She is to use those sparingly.  Call immediately with new or worsening symptoms.  I will talk with her after she has the CT scan.  No problem-specific Assessment & Plan notes found for this encounter.   MD

## 2021-10-10 ENCOUNTER — Telehealth: Payer: Self-pay

## 2021-10-10 ENCOUNTER — Encounter: Payer: Self-pay | Admitting: Family Medicine

## 2021-10-10 DIAGNOSIS — R109 Unspecified abdominal pain: Secondary | ICD-10-CM

## 2021-10-10 LAB — COMPREHENSIVE METABOLIC PANEL
ALT: 20 IU/L (ref 0–32)
AST: 23 IU/L (ref 0–40)
Albumin/Globulin Ratio: 2.3 — ABNORMAL HIGH (ref 1.2–2.2)
Albumin: 4.4 g/dL (ref 3.7–4.7)
Alkaline Phosphatase: 70 IU/L (ref 44–121)
BUN/Creatinine Ratio: 20 (ref 12–28)
BUN: 15 mg/dL (ref 8–27)
Bilirubin Total: 0.3 mg/dL (ref 0.0–1.2)
CO2: 23 mmol/L (ref 20–29)
Calcium: 10 mg/dL (ref 8.7–10.3)
Chloride: 97 mmol/L (ref 96–106)
Creatinine, Ser: 0.74 mg/dL (ref 0.57–1.00)
Globulin, Total: 1.9 g/dL (ref 1.5–4.5)
Glucose: 85 mg/dL (ref 70–99)
Potassium: 4.4 mmol/L (ref 3.5–5.2)
Sodium: 137 mmol/L (ref 134–144)
Total Protein: 6.3 g/dL (ref 6.0–8.5)
eGFR: 78 mL/min/{1.73_m2} (ref >59)

## 2021-10-10 NOTE — Telephone Encounter (Signed)
Ok we can add pelvis. Do I need to change the order or add one or can they do it with a verbal from you? THANKS! [name]

## 2021-10-10 NOTE — Telephone Encounter (Signed)
Vanessa Melton at [facility] calls nurse line in regards to CT order.   Vanessa Melton reports due to diagnose of "flank pain" they are requesting a pelvic to be added as well.   If you have any questions regarding order you can reach Vanessa Melton at [phone].

## 2021-10-10 NOTE — Telephone Encounter (Addendum)
See telephone note.  Once order is adjusted by PCP I will call to schedule.

## 2021-10-11 NOTE — Telephone Encounter (Signed)
Spoke with [employer].   The request provider place order to add pelvis.   Once placed please let me know and I will call to get her scheduled.

## 2021-10-15 NOTE — Addendum Note (Signed)
Addended byNestor Ramp on: 10/15/2021 03:42 PM   Modules accepted: Orders

## 2021-10-15 NOTE — Telephone Encounter (Signed)
RN team I placed an order for  the ct abdomen and pelvis so if they could just cancel the previous ct of abdomen, then you should be able to scheduleit THANKS! Vanessa Melton

## 2021-10-16 NOTE — Telephone Encounter (Signed)
Called [facility] and rescheduled for 11/08/21 at 3:20 pm.   Called and informed patient of change.   Patient verbalizes understanding and is appreciative.   Veronda Prude, RN

## 2021-10-18 ENCOUNTER — Encounter: Payer: Self-pay | Admitting: Family Medicine

## 2021-10-30 DIAGNOSIS — H35363 Drusen (degenerative) of macula, bilateral: Secondary | ICD-10-CM | POA: Diagnosis not present

## 2021-10-30 DIAGNOSIS — H35373 Puckering of macula, bilateral: Secondary | ICD-10-CM | POA: Diagnosis not present

## 2021-10-30 DIAGNOSIS — H40051 Ocular hypertension, right eye: Secondary | ICD-10-CM | POA: Diagnosis not present

## 2021-10-30 DIAGNOSIS — H35453 Secondary pigmentary degeneration, bilateral: Secondary | ICD-10-CM | POA: Diagnosis not present

## 2021-10-30 DIAGNOSIS — H353131 Nonexudative age-related macular degeneration, bilateral, early dry stage: Secondary | ICD-10-CM | POA: Diagnosis not present

## 2021-10-30 DIAGNOSIS — Z961 Presence of intraocular lens: Secondary | ICD-10-CM | POA: Diagnosis not present

## 2021-11-08 ENCOUNTER — Ambulatory Visit
Admission: RE | Admit: 2021-11-08 | Discharge: 2021-11-08 | Disposition: A | Discharge status: Home / Self Care | Payer: Medicare PPO | Source: Ambulatory Visit | Attending: Family Medicine | Admitting: Family Medicine

## 2021-11-08 DIAGNOSIS — R109 Unspecified abdominal pain: Secondary | ICD-10-CM | POA: Diagnosis not present

## 2021-11-08 DIAGNOSIS — K5900 Constipation, unspecified: Secondary | ICD-10-CM | POA: Diagnosis not present

## 2021-11-11 ENCOUNTER — Other Ambulatory Visit: Payer: Medicare PPO

## 2021-11-11 ENCOUNTER — Encounter: Payer: Self-pay | Admitting: Family Medicine

## 2021-11-13 DIAGNOSIS — D3132 Benign neoplasm of left choroid: Secondary | ICD-10-CM | POA: Diagnosis not present

## 2021-12-17 DIAGNOSIS — H401111 Primary open-angle glaucoma, right eye, mild stage: Secondary | ICD-10-CM | POA: Diagnosis not present

## 2021-12-17 DIAGNOSIS — H40002 Preglaucoma, unspecified, left eye: Secondary | ICD-10-CM | POA: Diagnosis not present

## 2021-12-18 ENCOUNTER — Encounter: Payer: Self-pay | Admitting: Family Medicine

## 2021-12-20 ENCOUNTER — Other Ambulatory Visit: Payer: Self-pay | Admitting: Family Medicine

## 2021-12-20 DIAGNOSIS — M546 Pain in thoracic spine: Secondary | ICD-10-CM

## 2021-12-20 MED ORDER — HYDROCODONE-ACETAMINOPHEN 5-325 MG PO TABS: 1.0000 | ORAL_TABLET | Freq: Three times a day (TID) | ORAL | 0 refills | Status: AC | PRN

## 2021-12-20 NOTE — Addendum Note (Signed)
Addended byNestor Ramp on: 12/20/2021 05:28 PM   Modules accepted: Orders

## 2021-12-30 ENCOUNTER — Other Ambulatory Visit: Payer: Self-pay | Admitting: *Deleted

## 2021-12-30 ENCOUNTER — Telehealth: Payer: Self-pay

## 2021-12-30 NOTE — Telephone Encounter (Signed)
Patient informed that her referral was sent to [facility] and was provided the phone number to call and check on status.  Marty Sadlowski,CMA

## 2021-12-30 NOTE — Progress Notes (Signed)
[facility] Dr Yevette Edwards 01/10/22 @ 230p [address] [place]  [phone]  Their office ([place]) called and informed pt of appt

## 2021-12-30 NOTE — Telephone Encounter (Signed)
Patient calls nurse line checking the status of Ortho referral.   Will forward to referral coordinator to check the status.

## 2021-12-30 NOTE — Telephone Encounter (Signed)
Unsure of referral status as she was seen by Dr. Jennette Kettle at [facility].  Sending message to [employer].  Vestal Crandall,CMA

## 2022-01-01 DIAGNOSIS — H401112 Primary open-angle glaucoma, right eye, moderate stage: Secondary | ICD-10-CM | POA: Diagnosis not present

## 2022-01-01 DIAGNOSIS — H35363 Drusen (degenerative) of macula, bilateral: Secondary | ICD-10-CM | POA: Diagnosis not present

## 2022-01-01 DIAGNOSIS — H35453 Secondary pigmentary degeneration, bilateral: Secondary | ICD-10-CM | POA: Diagnosis not present

## 2022-01-01 DIAGNOSIS — D3132 Benign neoplasm of left choroid: Secondary | ICD-10-CM | POA: Diagnosis not present

## 2022-01-01 DIAGNOSIS — H35371 Puckering of macula, right eye: Secondary | ICD-10-CM | POA: Diagnosis not present

## 2022-01-01 DIAGNOSIS — H353131 Nonexudative age-related macular degeneration, bilateral, early dry stage: Secondary | ICD-10-CM | POA: Diagnosis not present

## 2022-01-01 DIAGNOSIS — Z961 Presence of intraocular lens: Secondary | ICD-10-CM | POA: Diagnosis not present

## 2022-01-10 DIAGNOSIS — M4696 Unspecified inflammatory spondylopathy, lumbar region: Secondary | ICD-10-CM | POA: Diagnosis not present

## 2022-01-10 DIAGNOSIS — M545 Low back pain, unspecified: Secondary | ICD-10-CM | POA: Diagnosis not present

## 2022-01-14 DIAGNOSIS — M6281 Muscle weakness (generalized): Secondary | ICD-10-CM | POA: Diagnosis not present

## 2022-01-14 DIAGNOSIS — S39012D Strain of muscle, fascia and tendon of lower back, subsequent encounter: Secondary | ICD-10-CM | POA: Diagnosis not present

## 2022-01-21 DIAGNOSIS — S39012D Strain of muscle, fascia and tendon of lower back, subsequent encounter: Secondary | ICD-10-CM | POA: Diagnosis not present

## 2022-01-21 DIAGNOSIS — M6281 Muscle weakness (generalized): Secondary | ICD-10-CM | POA: Diagnosis not present

## 2022-01-22 DIAGNOSIS — H401133 Primary open-angle glaucoma, bilateral, severe stage: Secondary | ICD-10-CM | POA: Diagnosis not present

## 2022-01-23 DIAGNOSIS — M6281 Muscle weakness (generalized): Secondary | ICD-10-CM | POA: Diagnosis not present

## 2022-01-23 DIAGNOSIS — S39012D Strain of muscle, fascia and tendon of lower back, subsequent encounter: Secondary | ICD-10-CM | POA: Diagnosis not present

## 2022-01-30 DIAGNOSIS — M6281 Muscle weakness (generalized): Secondary | ICD-10-CM | POA: Diagnosis not present

## 2022-01-30 DIAGNOSIS — S39012D Strain of muscle, fascia and tendon of lower back, subsequent encounter: Secondary | ICD-10-CM | POA: Diagnosis not present

## 2022-02-04 DIAGNOSIS — S39012D Strain of muscle, fascia and tendon of lower back, subsequent encounter: Secondary | ICD-10-CM | POA: Diagnosis not present

## 2022-02-04 DIAGNOSIS — M6281 Muscle weakness (generalized): Secondary | ICD-10-CM | POA: Diagnosis not present

## 2022-02-07 DIAGNOSIS — H401131 Primary open-angle glaucoma, bilateral, mild stage: Secondary | ICD-10-CM | POA: Diagnosis not present

## 2022-02-13 DIAGNOSIS — M6281 Muscle weakness (generalized): Secondary | ICD-10-CM | POA: Diagnosis not present

## 2022-02-13 DIAGNOSIS — S39012D Strain of muscle, fascia and tendon of lower back, subsequent encounter: Secondary | ICD-10-CM | POA: Diagnosis not present

## 2022-02-21 DIAGNOSIS — S39012D Strain of muscle, fascia and tendon of lower back, subsequent encounter: Secondary | ICD-10-CM | POA: Diagnosis not present

## 2022-02-21 DIAGNOSIS — M6281 Muscle weakness (generalized): Secondary | ICD-10-CM | POA: Diagnosis not present

## 2022-02-24 DIAGNOSIS — M6281 Muscle weakness (generalized): Secondary | ICD-10-CM | POA: Diagnosis not present

## 2022-02-24 DIAGNOSIS — S39012D Strain of muscle, fascia and tendon of lower back, subsequent encounter: Secondary | ICD-10-CM | POA: Diagnosis not present

## 2022-03-20 NOTE — Progress Notes (Signed)
I connected with  Vanessa Melton on 03/21/2022  by a audio enabled telemedicine application and verified that I am speaking with the correct person using two identifiers.  Patient Location: Home  Provider Location: Home Office  I discussed the limitations of evaluation and management by telemedicine. The patient expressed understanding and agreed to proceed.  Subjective:   Vanessa Melton is a 89 y.o. female who presents for Medicare Annual (Subsequent) preventive examination.  Review of Systems    Per HPI unless specifically indicated below.  Cardiac Risk Factors include: advanced age (>42mn, >>80women);female gender, and Essential Hypertension.          Objective:       10/09/2021   10:36 AM 06/26/2021    9:37 AM 06/26/2021    9:19 AM  Vitals with BMI  Height 5' 2"$   5' 2"$   Weight 149 lbs 6 oz  149 lbs 6 oz  BMI [id number] Systolic [id number] Diastolic 70 80 77  Pulse 66  62    Today's Vitals   03/21/22 1050  PainSc: 2    There is no height or weight on file to calculate BMI.     03/21/2022   11:19 AM 10/09/2021   10:38 AM 06/26/2021    9:23 AM 05/09/2021   11:20 AM 06/13/2020    9:35 AM 05/25/2020    1:31 PM 05/08/2020    9:37 AM  Advanced Directives  Does Patient Have a Medical Advance Directive? Yes No Yes Yes No No No  Type of [place]amed[place]of [place] will   [place] will     Does patient want to make changes to medical advance directive? No - Patient declined   No - Patient declined     Copy of [place] Chart? No - copy requested   Yes - validated most recent copy scanned in chart (See row information)     Would patient like information on creating a medical advance directive?     No - Patient declined No - Patient declined No - Patient declined    Current Medications (verified) Outpatient Encounter Medications as of 03/21/2022  Medication Sig   amLODipine (NORVASC) 2.5 MG  tablet Take one by mouth daily   levothyroxine (SYNTHROID) 75 MCG tablet TAKE 1 TABLET(75 MCG) BY MOUTH DAILY   Multiple Vitamins-Minerals (ICAPS AREDS 2 PO) Take 1 tablet by mouth 2 (two) times daily.   omeprazole (PRILOSEC) 40 MG capsule TAKE 1 CAPSULE BY MOUTH 20 MINUTES BEFORE BREAKFAST   OVER THE COUNTER MEDICATION Apply 1 application topically daily as needed (pain). CBD cream   rizatriptan (MAXALT) 10 MG tablet Take 1 tablet (10 mg total) by mouth as needed for migraine. May repeat in 2 hours if needed   cholecalciferol (VITAMIN D3) 25 MCG (1000 UT) tablet Take 3,000 Units by mouth daily.  (Patient not taking: Reported on 03/21/2022)   cyclobenzaprine (FLEXERIL) 5 MG tablet Take 1 tablet (5 mg total) by mouth at bedtime. (Patient not taking: Reported on 03/21/2022)   traMADol (ULTRAM) 50 MG tablet 1-2 tabs po every 8 hours prn pain (Patient not taking: Reported on 03/21/2022)   No facility-administered encounter medications on file as of 03/21/2022.    Allergies (verified) Beta adrenergic blockers, Candesartan cilexetil-hctz, Codeine, Penicillins, Ace inhibitors, Combigan [brimonidine tartrate-timolol], Doxazosin, and Other   History: Past Medical History:  Diagnosis Date   Allergy    Aortic atherosclerosis (  [place]) 05/03/2018   Arthritis    Atypical chest pain 05/03/2018   Cataract    GERD (gastroesophageal reflux disease)    Hypertension    Hypothyroidism    Mitral valve prolapse 05/03/2018   Right knee pain 01/03/2015   Right wrist injury 01/07/2018   Past Surgical History:  Procedure Laterality Date   CATARACT EXTRACTION W/ INTRAOCULAR LENS IMPLANT     COSMETIC SURGERY     EYE MUSCLE SURGERY     EYE SURGERY     FRACTURE SURGERY     Family History  Problem Relation Age of Onset   Colon cancer Mother    Emphysema Father    Aneurysm Brother    Stroke Brother    Colon polyps Daughter    Colon cancer Brother    Social History   Socioeconomic History   Marital status:  Significant Other    Spouse name: Not on file   Number of children: 3   Years of education: 21   Highest education level: Doctorate  Occupational History   Occupation: retired professor    Comment: [employer]  Tobacco Use   Smoking status: Former    Years: 5.00    Types: Cigarettes    Quit date: 02/03/1957    Years since quitting: 45.1    Passive exposure: Past   Smokeless tobacco: Never   Tobacco comments:    no plan to start again  Vaping Use   Vaping Use: Never used  Substance and Sexual Activity   Alcohol use: Yes    Alcohol/week: 2.0 standard drinks of alcohol    Types: 2 Glasses of wine per week    Comment: social   Drug use: Yes    Types: Marijuana   Sexual activity: Yes    Birth control/protection: None  Other Topics Concern   Not on file  Social History Narrative   Significant other: Dr, Vanessa Melton   Home is split level, 6 stairs. Handrails on stairs, has smoke alarms and fire extinguisher.   Exercises 5+ days a week 1-1.5 hours per day   Golfs   Shows horses--Arabian female named BOGO (after buying the mare she got him free--buy one get one=BOGO)   All [place]   Has an adopted Neurosurgeon.   Travels frequently   Wears seat belts in vehicles.      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   Social Determinants of Health   Financial Resource Strain: Low Risk  (03/21/2022)   Overall Financial Resource Strain (CARDIA)    Difficulty of Paying Living Expenses: Not hard at all  Food Insecurity: No Food Insecurity (03/21/2022)   Hunger Vital Sign    Worried About Running Out of Food in the Last Year: Never true    Ran Out of Food in the Last Year: Never true  Transportation Needs: No Transportation Needs (03/21/2022)   PRAPARE - Hydrologist (Medical): No    Lack of Transportation (Non-Medical): No  Physical Activity: Sufficiently Active (03/21/2022)   Exercise Vital Sign    Days of Exercise per Week: 4 days     Minutes of Exercise per Session: 50 min  Stress: No Stress Concern Present (03/21/2022)   [place]    Feeling of Stress : Not at all  Social Connections: [place] (03/21/2022)   Social Connection and Isolation Panel [NHANES]    Frequency of Communication with Friends  and Family: More than three times a week    Frequency of Social Gatherings with Friends and Family: More than three times a week    Attends Religious Services: More than 4 times per year    Active Member of [employer] or Organizations: Yes    Attends Music therapist: More than 4 times per year    Marital Status: Living with partner    Tobacco Counseling Counseling given: No Tobacco comments: no plan to start again   Clinical Intake:  Pre-visit preparation completed: No  Pain : 0-10 Pain Score: 2  Pain Type: Chronic pain Pain Location: Back Pain Orientation: Left, Medial, Mid Pain Descriptors / Indicators: Aching, Nagging Pain Onset: 1 to 4 weeks ago Pain Frequency: Intermittent     Nutritional Status: BMI of 19-24  Normal Nutritional Risks: None Diabetes: No  How often do you need to have someone help you when you read instructions, pamphlets, or other written materials from your doctor or pharmacy?: 1 - Never  Diabetic? No  Interpreter Needed?: No  Information entered by :: Donnie Mesa, CMA   Activities of Daily Living    03/21/2022   10:46 AM  In your present state of health, do you have any difficulty performing the following activities:  Hearing? 0  Vision? 0  Difficulty concentrating or making decisions? 1  Walking or climbing stairs? 0  Dressing or bathing? 0  Doing errands, shopping? 0    Patient Care Team: Dickie La, MD as PCP - General (Family Medicine) Calvert Cantor, MD as Consulting Physician (Ophthalmology) Materin, Rollene Rotunda, MD as Referring Physician (Ophthalmology) Edson Snowball,  MD as Referring Physician (Ophthalmology) Jalene Mullet, MD as Consulting Physician (Ophthalmology)  Indicate any recent Medical Services you may have received from other than [facility] providers in the past year (date may be approximate).     Assessment:   This is a routine wellness examination for Camiyah Stophel.  Hearing/Vision screen Denies any hearing issues. Denies any change to her vision. Wear glasses. Annual Eye Exam. Materin, Rollene Rotunda, MD  (Ophthalmology) Sylvan Cheese   Dietary issues and exercise activities discussed: Current Exercise Habits: Structured exercise class;Home exercise routine, Type of exercise: stretching;walking;strength training/weights, Time (Minutes): 45, Frequency (Times/Week): 4, Weekly Exercise (Minutes/Week): 180, Intensity: Moderate, Exercise limited by: None identified   Goals Addressed             This Visit's Progress    Stay Active and Independent-Mid Back Pain       Why is this important?   Regular activity or exercise is important to managing back pain.  Activity helps to keep your muscles strong.  You will sleep better and feel more relaxed.  You will have more energy and feel less stressed.  If you are not active now, start slowly. Little changes make a big difference.  Rest, but not too much.  Stay as active as you can and listen to your body's signals.           Depression Screen    03/21/2022   10:45 AM 10/09/2021   10:37 AM 06/26/2021    9:20 AM 10/10/2020   11:30 AM 07/25/2020   10:17 AM 06/13/2020    9:44 AM 05/25/2020    1:30 PM  PHQ 2/9 Scores  PHQ - 2 Score 0 0 0 0 0 0 0  PHQ- 9 Score  0 0 0 0 0 0    Fall Risk    03/21/2022  10:48 AM 10/09/2021   10:38 AM 06/26/2021    9:20 AM 05/09/2021   11:20 AM 10/10/2020   11:22 AM  Fall Risk   Falls in the past year? 0 0 1 1 1  $ Number falls in past yr: 0 0 0 1 0  Injury with Fall? 0  0 0   Risk for fall due to : Impaired vision;No Fall Risks   Other (Comment)   Follow up Falls  evaluation completed Falls evaluation completed Falls evaluation completed Falls prevention discussed     FALL RISK PREVENTION PERTAINING TO THE HOME:  Any stairs in or around the home? Yes  If so, are there any without handrails? No  Home free of loose throw rugs in walkways, pet beds, electrical cords, etc? Yes  Adequate lighting in your home to reduce risk of falls? Yes   ASSISTIVE DEVICES UTILIZED TO PREVENT FALLS:  Life alert? Yes  Use of a cane, walker or w/c? No  Grab bars in the bathroom? No  Shower chair or bench in shower? No  Elevated toilet seat or a handicapped toilet? No   TIMED UP AND GO:  Was the test performed? Unable to perform, virtual appointment   Cognitive Function:    11/30/2017    2:34 PM  MMSE - Mini Mental State Exam  Orientation to time 5  Orientation to Place 5  Registration 3  Attention/ Calculation 5  Recall 3  Language- name 2 objects 2  Language- repeat 1  Language- follow 3 step command 3  Language- read & follow direction 1  Write a sentence 1  Copy design 1  Total score 30        03/21/2022   10:49 AM 11/30/2017    2:35 PM  6CIT Screen  What Year? 0 points 0 points  What month? 3 points 0 points  What time? 0 points 0 points  Count back from 20 0 points 0 points  Months in reverse 0 points 0 points  Repeat phrase 0 points 0 points  Total Score 3 points 0 points    Immunizations Immunization History  Administered Date(s) Administered   Influenza, High Dose Seasonal PF 12/09/2017, 11/23/2018   PFIZER Comirnaty(Gray Top)Covid-19 Tri-Sucrose Vaccine 06/13/2020   PFIZER(Purple Top)SARS-COV-2 Vaccination 02/15/2019, 03/07/2019, 11/27/2020   PNEUMOCOCCAL CONJUGATE-20 06/13/2020   Pfizer Covid-19 Vaccine Bivalent Booster 66yr & up 06/26/2021   Pneumococcal Conjugate-13 05/21/2016   Td 02/04/2004, 04/28/2014   Zoster, Live 04/03/2012    TDAP status: Up to date  Influenza :Up to date  RSV: Up to date  Pneumococcal  vaccine status: Up to date  Covid-19 vaccine status: Information provided on how to obtain vaccines.   Qualifies for Shingles Vaccine? Yes   Zostavax completed No   Shingrix Completed?: No.    Education has been provided regarding the importance of this vaccine. Patient has been advised to call insurance company to determine out of pocket expense if they have not yet received this vaccine. Advised may also receive vaccine at local pharmacy or Health Dept. Verbalized acceptance and understanding.  Screening Tests Health Maintenance  Topic Date Due   Zoster Vaccines- Shingrix (1 of 2) Never done   INFLUENZA VACCINE  09/03/2021   COVID-19 Vaccine (6 - 2023-24 season) 10/04/2021   Medicare Annual Wellness (AWV)  03/22/2023   COLONOSCOPY (Pts 45-443yrInsurance coverage will need to be confirmed)  08/23/2023   DTaP/Tdap/Td (3 - Tdap) 04/27/2024   Pneumonia Vaccine 6551Years old  Completed  DEXA SCAN  Completed   HPV VACCINES  Aged Out    Health Maintenance  Health Maintenance Due  Topic Date Due   Zoster Vaccines- Shingrix (1 of 2) Never done   INFLUENZA VACCINE  09/03/2021   COVID-19 Vaccine (6 - 2023-24 season) 10/04/2021    Colorectal cancer screening: Type of screening: Colonoscopy. Completed 08/23/2018. Repeat every 5 years  Mammogram status: No longer required due to age.  DEXA Scan: 07/22/2006   Lung Cancer Screening: (Low Dose CT Chest recommended if Age 27-80 years, 30 pack-year currently smoking OR have quit w/in 15years.) does not qualify.   Lung Cancer Screening Referral: not applicable   Additional Screening:  Hepatitis C Screening: does not qualify; not applicable   Vision Screening: Recommended annual ophthalmology exams for early detection of glaucoma and other disorders of the eye. Is the patient up to date with their annual eye exam?  Yes  Who is the provider or what is the name of the office in which the patient attends annual eye exams? Materin, Rollene Rotunda, MD as Referring Physician (Ophthalmology) Edson Snowball, M If pt is not established with a provider, would they like to be referred to a provider to establish care? No .   Dental Screening: Recommended annual dental exams for proper oral hygiene  Community Resource Referral / Chronic Care Management: CRR required this visit?  No   CCM required this visit?  No      Plan:     I have personally reviewed and noted the following in the patient's chart:   Medical and social history Use of alcohol, tobacco or illicit drugs  Current medications and supplements including opioid prescriptions. Patient is not currently taking opioid prescriptions. Functional ability and status Nutritional status Physical activity Advanced directives List of other physicians Hospitalizations, surgeries, and ER visits in previous 12 months Vitals Screenings to include cognitive, depression, and falls Referrals and appointments  In addition, I have reviewed and discussed with patient certain preventive protocols, quality metrics, and best practice recommendations. A written personalized care plan for preventive services as well as general preventive health recommendations were provided to patient.     Ms. Spruiell , Thank you for taking time to come for your Medicare Wellness Visit. I appreciate your ongoing commitment to your health goals. Please review the following plan we discussed and let me know if I can assist you in the future.   These are the goals we discussed:  Goals      Patient Stated     Decreased back pain      Patient Stated     Improvement in eyes     Stay Active and Independent-Mid Back Pain     Why is this important?   Regular activity or exercise is important to managing back pain.  Activity helps to keep your muscles strong.  You will sleep better and feel more relaxed.  You will have more energy and feel less stressed.  If you are not active now, start slowly. Little  changes make a big difference.  Rest, but not too much.  Stay as active as you can and listen to your body's signals.            This is a list of the screening recommended for you and due dates:  Health Maintenance  Topic Date Due   Zoster (Shingles) Vaccine (1 of 2) Never done   Flu Shot  09/03/2021   COVID-19 Vaccine (6 - 2023-24  season) 10/04/2021   Medicare Annual Wellness Visit  03/22/2023   Colon Cancer Screening  08/23/2023   DTaP/Tdap/Td vaccine (3 - Tdap) 04/27/2024   Pneumonia Vaccine  Completed   DEXA scan (bone density measurement)  Completed   HPV Vaccine  Aged [address], [place]   03/21/2022  Nurse Notes: Approximately 30 minute Non-Face -To-Face Medicare Wellness Visit. The patient is requesting that you place an order for a virtual colonoscopy for her to have done at [facility]. She stated that she was informed by her Gastroenterologist that she doesn't need to have any more colonoscopies because of her age. She would like to continue getting them because she is considered high risk with polyps been removed every colonoscopy and /fm hx colon ca-mom, bro, sis/and 1st cousin.

## 2022-03-20 NOTE — Patient Instructions (Signed)

## 2022-03-21 ENCOUNTER — Ambulatory Visit (INDEPENDENT_AMBULATORY_CARE_PROVIDER_SITE_OTHER): Payer: Medicare PPO

## 2022-03-21 ENCOUNTER — Telehealth: Payer: Self-pay

## 2022-03-21 DIAGNOSIS — Z0000 Encounter for general adult medical examination without abnormal findings: Secondary | ICD-10-CM

## 2022-03-21 DIAGNOSIS — Z1211 Encounter for screening for malignant neoplasm of colon: Secondary | ICD-10-CM

## 2022-03-21 NOTE — Telephone Encounter (Signed)
The patient had her Annual [employer] Visit today. She is requesting that you place an order for a virtual colonoscopy for her to have done at [facility]. She stated that she was informed by her Gastroenterologist that she doesn't need to have any more colonoscopies because of her age. She would like to continue getting them because she states that she is  considered high risk with polyps been removed every time and her /fm hx colon ca-mom, bro, sis/and 1st cousin.

## 2022-03-24 NOTE — Addendum Note (Signed)
Addended byDorcas Mcmurray L on: 03/24/2022 12:57 PM   Modules accepted: Orders

## 2022-03-24 NOTE — Telephone Encounter (Signed)
Spoke w pt via phone. Discussed USPSFT guidleines for colon cancer screening, her past pathology (tubular adenoma with no high grade dysplasia, alternative testing virtual colonoscopy which I do not recommend, parthly due to cost and it is not likely to be covered by isurance given she is outside agr for even extended screening (USPSTF), Cologuard, FIT testing. She is pretty anxious about not being screened at all and considers herself hi risk due to hx of polyps.We decided on FIT testing yearly although if it is positive we would have to refer back to GI for potential other procedure (could also consider Cologuard).  I will order FIT test for her.

## 2022-03-25 ENCOUNTER — Other Ambulatory Visit: Payer: Self-pay | Admitting: Family Medicine

## 2022-03-25 DIAGNOSIS — Z1211 Encounter for screening for malignant neoplasm of colon: Secondary | ICD-10-CM

## 2022-04-09 ENCOUNTER — Other Ambulatory Visit: Payer: Self-pay | Admitting: Family Medicine

## 2022-04-09 DIAGNOSIS — Z1211 Encounter for screening for malignant neoplasm of colon: Secondary | ICD-10-CM | POA: Diagnosis not present

## 2022-04-14 LAB — FECAL OCCULT BLOOD, IMMUNOCHEMICAL: Fecal Occult Bld: NEGATIVE

## 2022-04-16 ENCOUNTER — Other Ambulatory Visit: Payer: Self-pay | Admitting: Family Medicine

## 2022-04-18 ENCOUNTER — Encounter: Payer: Self-pay | Admitting: Family Medicine

## 2022-05-12 DIAGNOSIS — H401131 Primary open-angle glaucoma, bilateral, mild stage: Secondary | ICD-10-CM | POA: Diagnosis not present

## 2022-06-05 DIAGNOSIS — D3132 Benign neoplasm of left choroid: Secondary | ICD-10-CM | POA: Diagnosis not present

## 2022-06-05 DIAGNOSIS — H35371 Puckering of macula, right eye: Secondary | ICD-10-CM | POA: Diagnosis not present

## 2022-06-05 DIAGNOSIS — H35363 Drusen (degenerative) of macula, bilateral: Secondary | ICD-10-CM | POA: Diagnosis not present

## 2022-06-05 DIAGNOSIS — Z961 Presence of intraocular lens: Secondary | ICD-10-CM | POA: Diagnosis not present

## 2022-06-05 DIAGNOSIS — H35453 Secondary pigmentary degeneration, bilateral: Secondary | ICD-10-CM | POA: Diagnosis not present

## 2022-06-05 DIAGNOSIS — H353131 Nonexudative age-related macular degeneration, bilateral, early dry stage: Secondary | ICD-10-CM | POA: Diagnosis not present

## 2022-06-16 ENCOUNTER — Ambulatory Visit (INDEPENDENT_AMBULATORY_CARE_PROVIDER_SITE_OTHER): Payer: Medicare PPO | Admitting: Student

## 2022-06-16 VITALS — BP 134/78 | HR 90 | Wt 145.0 lb

## 2022-06-16 DIAGNOSIS — R112 Nausea with vomiting, unspecified: Secondary | ICD-10-CM

## 2022-06-16 MED ORDER — ONDANSETRON HCL 4 MG PO TABS: 4.0000 mg | ORAL_TABLET | Freq: Three times a day (TID) | ORAL | 0 refills | Status: DC | PRN

## 2022-06-16 MED ORDER — ONDANSETRON 4 MG PO TBDP: 4.0000 mg | ORAL_TABLET | Freq: Once | ORAL | Status: AC

## 2022-06-16 MED ORDER — ONDANSETRON 4 MG PO TBDP
4.0000 mg | ORAL_TABLET | Freq: Once | ORAL | Status: AC
  Administered 2022-06-16: 4 mg via ORAL

## 2022-06-16 NOTE — Progress Notes (Unsigned)
  SUBJECTIVE:   CHIEF COMPLAINT / HPI:   Diarrhea and vomiting Severe vomiting and diarrhea since last night. They happen at the same time. Everything started yesterday afternoon. 6 you grandson started throwing up and another person from social event has been having diarrhea. Doesn't believe it's related to what she ate. More than 6-10 episodes, not able to keep food down. Also throwing up water. Notes no blood in stools and no oily appearance to stools.   PERTINENT  PMH / PSH: ***  Past Medical History:  Diagnosis Date   Allergy    Aortic atherosclerosis (HCC) 05/03/2018   Arthritis    Atypical chest pain 05/03/2018   Cataract    GERD (gastroesophageal reflux disease)    Hypertension    Hypothyroidism    Mitral valve prolapse 05/03/2018   Right knee pain 01/03/2015   Right wrist injury 01/07/2018    Patient Care Team: Nestor Ramp, MD as PCP - General (Family Medicine) Nelson Chimes, MD as Consulting Physician (Ophthalmology) Materin, Huey Romans, MD as Referring Physician (Ophthalmology) Saralyn Pilar, MD as Referring Physician (Ophthalmology) Carmela Rima, MD as Consulting Physician (Ophthalmology) OBJECTIVE:  BP 134/78   Pulse (!) 112   Wt 145 lb (65.8 kg)   SpO2 98%   BMI 26.52 kg/m  Physical Exam   ASSESSMENT/PLAN:  There are no diagnoses linked to this encounter. No follow-ups on file. Bess Kinds, MD 06/16/2022, 11:10 AM PGY-***, [facility] {    This will disappear when note is signed, click to select method of visit    :1}

## 2022-06-16 NOTE — Patient Instructions (Signed)
Have sent in a Rx for ondansetron as we discussed. Try sips and chips, constantly rather than larger amounts of water. If you do not feel like you are making progress, the next step would be to go to the ED for fluids. Your symptoms should mostly resolve in 24-48 hours. If not, we need to see you again.

## 2022-06-17 ENCOUNTER — Telehealth: Payer: Self-pay | Admitting: Family Medicine

## 2022-06-17 ENCOUNTER — Encounter: Payer: Self-pay | Admitting: Student

## 2022-06-17 DIAGNOSIS — R112 Nausea with vomiting, unspecified: Secondary | ICD-10-CM | POA: Insufficient documentation

## 2022-06-17 HISTORY — DX: Nausea with vomiting, unspecified: R11.2

## 2022-06-17 NOTE — Assessment & Plan Note (Addendum)
Patient notes nausea and vomiting/diarrhea that started last evening after social event.  Patient notes that 2 other guests that event began having GI symptoms as well, but patient does not believe it was anything related to what she ate.  Patient has had numerous episodes of diarrhea and vomiting with difficulty keeping any fluids down and is currently not taking in solids.  She notes she feels a little lightheaded when standing and walking.  Patient appears ill and tired with slightly moist nearly dry mucous membranes good skin turgor and cap refill.  Patient's vitals normotensive with slight tachycardia.  Patient likely suffering from GI virus given symptoms and recent social event with attendees having similar symptoms. Discussed ED precautions with patient and after receiving dose of Zofran, through shared decision making, patient decided to go home to try fluid management and will go to ED if needed -Zofran 4mg  x 20 -ED precautions given for dehydration -F/u if not improved in 24-48 hours -Small frequent sips/ice chips for hydration

## 2022-06-17 NOTE — Telephone Encounter (Signed)
Spoke w patient. She is feeling better. Still slight nausea and a bit of diarrhea but much better. Taking PO pretty well. Will call if things get worse again or if it does not resolve

## 2022-08-06 DIAGNOSIS — H35453 Secondary pigmentary degeneration, bilateral: Secondary | ICD-10-CM | POA: Diagnosis not present

## 2022-08-06 DIAGNOSIS — D3132 Benign neoplasm of left choroid: Secondary | ICD-10-CM | POA: Diagnosis not present

## 2022-08-06 DIAGNOSIS — H35363 Drusen (degenerative) of macula, bilateral: Secondary | ICD-10-CM | POA: Diagnosis not present

## 2022-08-06 DIAGNOSIS — H353132 Nonexudative age-related macular degeneration, bilateral, intermediate dry stage: Secondary | ICD-10-CM | POA: Diagnosis not present

## 2022-08-06 DIAGNOSIS — H401112 Primary open-angle glaucoma, right eye, moderate stage: Secondary | ICD-10-CM | POA: Diagnosis not present

## 2022-08-12 ENCOUNTER — Other Ambulatory Visit: Payer: Self-pay | Admitting: Family Medicine

## 2022-08-12 DIAGNOSIS — E034 Atrophy of thyroid (acquired): Secondary | ICD-10-CM

## 2022-09-01 NOTE — Progress Notes (Unsigned)
Referring:  Carmela Rima, MD [address] [place],  [place] [address]  PCP: Nestor Ramp, MD  Neurology was asked to evaluate Mera Zubek, an 89 year old female for a chief complaint of ocular migraines.  Our recommendations of care will be communicated by shared medical record.    CC:  ocular migraines  History provided from self  HPI:  Medical co-morbidities: HTN, MVP, hypothyroidism, aortic atherosclerosis  The patient presents for evaluation of vision changes which began 1 month ago. She describes this as a "fuzzy round black blob" in the center of her vision in her left eye. Also has flashing green, red, and white lights in her vision. This is not associated with headache. Vision changes are persistent and she has a constant spot in her left eye at all times, though it may fluctuate in intensity.  She has had migraine with visual aura since she was a child. She has not had a migraine headache for the past 18 months. Her previous migraine auras would last for ~1 hour, then fade away.   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 Discussed vision changes with her eye doctor who did not believe symptoms were related to the tumor.  Current Treatment: Abortive Maxalt 10 mg PRN  Preventative none  Prior Therapies                                 Rescue: Maxalt 10 mg PRN - sedation Cyclobenzaprine Tramadol Zofran  Prevention: Losartan 100 mg daily Lisinopril - cough Beta blockers - chest pain, dizziness amlodipine   LABS: CBC    Component Value Date/Time   WBC 5.7 05/08/2020 1042   WBC 8.1 05/03/2018 0247   RBC 4.59 05/08/2020 1042   RBC 4.80 05/03/2018 0247   HGB 13.4 05/08/2020 1042   HCT 40.8 05/08/2020 1042   PLT 187 05/08/2020 1042   MCV 89 05/08/2020 1042   MCH 29.2 05/08/2020 1042   MCH 28.8 05/03/2018 0247   MCHC 32.8 05/08/2020 1042   MCHC 31.7 05/03/2018 0247   RDW 12.9 05/08/2020 1042   LYMPHSABS 1.7 09/11/2011 1205    MONOABS 0.4 09/11/2011 1205   EOSABS 0.1 09/11/2011 1205   BASOSABS 0.0 09/11/2011 1205      Latest Ref Rng & Units 10/09/2021   11:55 AM 05/08/2020   10:42 AM 05/03/2018    2:47 AM  CMP  Glucose 70 - 99 mg/dL 85  95  914   BUN 8 - 27 mg/dL 15  9  13    Creatinine 0.57 - 1.00 mg/dL 7.82  9.56  2.13   Sodium 134 - 144 mmol/L 137  142  139   Potassium 3.5 - 5.2 mmol/L 4.4  4.1  3.8   Chloride 96 - 106 mmol/L 97  104  107   CO2 20 - 29 mmol/L 23  23  22    Calcium 8.7 - 10.3 mg/dL 08.6  9.7  57.8   Total Protein 6.0 - 8.5 g/dL 6.3  6.6    Total Bilirubin 0.0 - 1.2 mg/dL 0.3  0.4    Alkaline Phos 44 - 121 IU/L 70  71    AST 0 - 40 IU/L 23  18    ALT 0 - 32 IU/L 20  16       IMAGING:  CTH 10/2020: no acute intracranial process  Imaging independently reviewed on September 02, 2022   Current Outpatient Medications on File Prior to Visit  Medication Sig Dispense Refill   amLODipine (NORVASC) 2.5 MG tablet TAKE 1 TABLET BY MOUTH DAILY 90 tablet 3   levothyroxine (SYNTHROID) 75 MCG tablet TAKE 1 TABLET(75 MCG) BY MOUTH DAILY 90 tablet 3   Multiple Vitamins-Minerals (ICAPS AREDS 2 PO) Take 1 tablet by mouth 2 (two) times daily.     omeprazole (PRILOSEC) 40 MG capsule TAKE 1 CAPSULE BY MOUTH 20 MINUTES BEFORE BREAKFAST     OVER THE COUNTER MEDICATION Apply 1 application topically daily as needed (pain). CBD cream     rizatriptan (MAXALT) 10 MG tablet Take 1 tablet (10 mg total) by mouth as needed for migraine. May repeat in 2 hours if needed 12 tablet 3   cholecalciferol (VITAMIN D3) 25 MCG (1000 UT) tablet Take 3,000 Units by mouth daily.  (Patient not taking: Reported on 03/21/2022)     cyclobenzaprine (FLEXERIL) 5 MG tablet Take 1 tablet (5 mg total) by mouth at bedtime. (Patient not taking: Reported on 03/21/2022) 15 tablet 1   ondansetron (ZOFRAN) 4 MG tablet Take 1 tablet (4 mg total) by mouth every 8 (eight) hours as needed for nausea or vomiting. (Patient not taking: Reported on 09/02/2022) 20  tablet 0   traMADol (ULTRAM) 50 MG tablet 1-2 tabs po every 8 hours prn pain (Patient not taking: Reported on 03/21/2022) 42 tablet 0   Current Facility-Administered Medications on File Prior to Visit  Medication Dose Route Frequency Provider Last Rate Last Admin   ondansetron (ZOFRAN-ODT) disintegrating tablet 4 mg  4 mg Oral Once Bess Kinds, MD         Allergies: Allergies  Allergen Reactions   Beta Adrenergic Blockers Shortness Of Breath and Other (See Comments)    Chest pain, dizziness   Candesartan Cilexetil-Hctz     Dyspnea on exertion   Codeine Shortness Of Breath and Rash    Headache   Penicillins Shortness Of Breath and Rash    Headache Did it involve swelling of the face/tongue/throat, SOB, or low BP? Yes Did it involve sudden or severe rash/hives, skin peeling, or any reaction on the inside of your mouth or nose? No Did you need to seek medical attention at a hospital or doctor's office? Unknown When did it last happen?      childhood allergy If all above answers are "NO", may proceed with cephalosporin use.    Ace Inhibitors Cough   Combigan [Brimonidine Tartrate-Timolol]     Chest pain   Doxazosin     Unknown reaction   Other     Steroids - swelling, weight gain     Family History: Family History  Problem Relation Age of Onset   Colon cancer Mother    Emphysema Father    Aneurysm Brother    Stroke Brother    Colon polyps Daughter    Colon cancer Brother      Past Medical History: Past Medical History:  Diagnosis Date   Allergy    Aortic atherosclerosis (HCC) 05/03/2018   Arthritis    Atypical chest pain 05/03/2018   Cataract    GERD (gastroesophageal reflux disease)    Hypertension    Hypothyroidism    Mitral valve prolapse 05/03/2018   Nausea and vomiting 06/17/2022   Right knee pain 01/03/2015   Right wrist injury 01/07/2018    Past Surgical History Past Surgical History:  Procedure Laterality Date   CATARACT EXTRACTION W/ INTRAOCULAR  LENS IMPLANT  COSMETIC SURGERY     EYE MUSCLE SURGERY     EYE SURGERY     FRACTURE SURGERY      Social History: Social History   Tobacco Use   Smoking status: Former    Current packs/day: 0.00    Types: Cigarettes    Start date: 02/04/1952    Quit date: 02/03/1957    Years since quitting: 65.6    Passive exposure: Past   Smokeless tobacco: Never   Tobacco comments:    no plan to start again  Vaping Use   Vaping status: Never Used  Substance Use Topics   Alcohol use: Yes    Alcohol/week: 2.0 standard drinks of alcohol    Types: 2 Glasses of wine per week    Comment: social   Drug use: Yes    Types: Marijuana    ROS: Negative for fevers, chills. Positive for vision changes. All other systems reviewed and negative unless stated otherwise in HPI.   Physical Exam:   Vital Signs: BP 139/75 (BP Location: Right Arm, Patient Position: Sitting, Cuff Size: Normal)   Pulse 65   Ht 5\' 2"  (1.575 m)   Wt 142 lb 3.2 oz (64.5 kg)   BMI 26.01 kg/m  GENERAL: well appearing,in no acute distress,alert SKIN:  Color, texture, turgor normal. No rashes or lesions HEAD:  Normocephalic/atraumatic. CV:  RRR RESP: Normal respiratory effort MSK: no tenderness to palpation over occiput, neck, or shoulders  NEUROLOGICAL: Mental Status: Alert, oriented to person, place and time,Follows commands Cranial Nerves: PERRL, visual fields intact to confrontation, extraocular movements intact, facial sensation intact, no facial droop or ptosis, hearing grossly intact, no dysarthria, palate elevate symmetrically, tongue protrudes midline, shoulder shrug intact and symmetric Motor: muscle strength 5/5 both upper and lower extremities,no drift, normal tone Reflexes: 2+ throughout Sensation: intact to light touch all 4 extremities Coordination: Finger-to- nose-finger intact bilaterally Gait: normal-based   IMPRESSION: 89 year old female with a history of HTN, MVP, hypothyroidism, aortic  atherosclerosis who presents for evaluation of vision changes in her left eye for the past month. Will order [facility] and CTA head/neck as she reports a persistent dark spot in her vision during this time. Discussed MRI brain, however she is severely claustrophobic and would prefer to avoid MRI if possible. If testing is normal, suspect she is having a prolonged migraine aura without infarction. Will trial gabapentin at bedtime to reduce migraine aura. Counseled that daily magnesium may reduce migraine aura frequency.  PLAN: -CTH, CTA head/neck -Start gabapentin 100 mg at bedtime -Start magnesium glycinate 500 mg daily -Next steps: consider MRI if symptoms do not improve despite treatment   I spent a total of 27 minutes chart reviewing and counseling the patient. Headache education was done. Discussed treatment options including preventive and acute medications, and natural supplements. Discussed medication side effects, adverse reactions and drug interactions. Written educational materials and patient instructions outlining all of the above were given.  Follow-up: 4 months   Ocie Doyne, MD 09/02/2022   3:22 PM

## 2022-09-02 ENCOUNTER — Encounter: Payer: Self-pay | Admitting: Psychiatry

## 2022-09-02 ENCOUNTER — Ambulatory Visit: Payer: Medicare PPO | Admitting: Psychiatry

## 2022-09-02 ENCOUNTER — Telehealth: Payer: Self-pay | Admitting: Psychiatry

## 2022-09-02 VITALS — BP 139/75 | HR 65 | Ht 62.0 in | Wt 142.2 lb

## 2022-09-02 DIAGNOSIS — G43109 Migraine with aura, not intractable, without status migrainosus: Secondary | ICD-10-CM | POA: Diagnosis not present

## 2022-09-02 DIAGNOSIS — H5462 Unqualified visual loss, left eye, normal vision right eye: Secondary | ICD-10-CM | POA: Diagnosis not present

## 2022-09-02 MED ORDER — GABAPENTIN 100 MG PO CAPS: 100.0000 mg | ORAL_CAPSULE | Freq: Every day | ORAL | 5 refills | Status: DC

## 2022-09-02 NOTE — Telephone Encounter (Signed)
Pt would like to move forward with a MRI versus a CT scan, pt asking to be contacted for this to be scheduled.

## 2022-09-02 NOTE — Telephone Encounter (Signed)
Can you send this back to me when the correct orders are in and the incorrect ones are cancelled please? Thanks!

## 2022-09-02 NOTE — Telephone Encounter (Signed)
I called patient and she would like to proceed with Mri verses Ct scan neck/head. Pt said you gave her an option at the visit today. Pt thought about it more and home and would like CT order d/c'd and MRI done.   Please advise

## 2022-09-02 NOTE — Patient Instructions (Addendum)
Try taking magnesium glycinate 500 mg daily for migraine aura prevention Start gabapentin 100 mg at bedtime for aura prevention CT scan of brain and blood vessels in head and neck

## 2022-09-03 ENCOUNTER — Other Ambulatory Visit: Payer: Self-pay | Admitting: Psychiatry

## 2022-09-03 DIAGNOSIS — H547 Unspecified visual loss: Secondary | ICD-10-CM

## 2022-09-03 MED ORDER — DIAZEPAM 5 MG PO TABS: ORAL_TABLET | ORAL | 0 refills | Status: DC

## 2022-09-03 NOTE — Telephone Encounter (Signed)
CTA head and neck [employer] auth: [id number] exp. 09/03/22-11/02/22 sent to GI [phone]  I cancelled the CT head.

## 2022-09-03 NOTE — Telephone Encounter (Signed)
Dr.Chima pt aware of MRI order said she would like something, she typically takes Valium. Rx can be sent to [facility] in [place] on [address]  Please advise

## 2022-09-03 NOTE — Telephone Encounter (Signed)
I put the MRI order in for her. She mentioned being very claustrophobic, does she need something for sedation for the MRI?

## 2022-09-03 NOTE — Telephone Encounter (Signed)
Valium sent, thanks

## 2022-09-03 NOTE — Telephone Encounter (Signed)
MRI brain [employer] auth: [id number] exp. 09/03/22-11/02/22 sent to GI [phone]

## 2022-09-03 NOTE — Telephone Encounter (Signed)
She will still need the CTA of her head and neck to look at her blood vessels, but she does not need the CT head

## 2022-09-17 ENCOUNTER — Other Ambulatory Visit: Payer: Medicare PPO

## 2022-09-17 ENCOUNTER — Ambulatory Visit
Admission: RE | Admit: 2022-09-17 | Discharge: 2022-09-17 | Disposition: A | Discharge status: Home / Self Care | Payer: Medicare PPO | Source: Ambulatory Visit | Attending: Psychiatry | Admitting: Psychiatry

## 2022-09-17 ENCOUNTER — Ambulatory Visit: Payer: Medicare PPO

## 2022-09-17 ENCOUNTER — Ambulatory Visit: Admission: RE | Admit: 2022-09-17 | Payer: Medicare PPO | Source: Ambulatory Visit

## 2022-09-17 DIAGNOSIS — H5462 Unqualified visual loss, left eye, normal vision right eye: Secondary | ICD-10-CM | POA: Diagnosis not present

## 2022-09-17 DIAGNOSIS — H547 Unspecified visual loss: Secondary | ICD-10-CM

## 2022-09-17 MED ORDER — GADOPICLENOL 0.5 MMOL/ML IV SOLN
7.0000 mL | Freq: Once | INTRAVENOUS | Status: AC | PRN
  Administered 2022-09-17: 7 mL via INTRAVENOUS

## 2022-09-17 MED ORDER — IOPAMIDOL (ISOVUE-370) INJECTION 76%
80.0000 mL | Freq: Once | INTRAVENOUS | Status: AC | PRN
  Administered 2022-09-17: 80 mL via INTRAVENOUS

## 2022-09-23 ENCOUNTER — Other Ambulatory Visit: Payer: Self-pay | Admitting: Psychiatry

## 2022-09-23 DIAGNOSIS — H539 Unspecified visual disturbance: Secondary | ICD-10-CM

## 2022-10-01 ENCOUNTER — Ambulatory Visit: Payer: Medicare PPO | Admitting: Neurology

## 2022-10-01 ENCOUNTER — Other Ambulatory Visit: Payer: Self-pay | Admitting: Psychiatry

## 2022-10-01 DIAGNOSIS — H539 Unspecified visual disturbance: Secondary | ICD-10-CM

## 2022-10-01 DIAGNOSIS — I773 Arterial fibromuscular dysplasia: Secondary | ICD-10-CM

## 2022-10-01 DIAGNOSIS — G4089 Other seizures: Secondary | ICD-10-CM

## 2022-10-01 NOTE — Procedures (Signed)
   History:  89 year old woman with paroxysmal events concerning for seizure  EEG classification:  Awake and asleep  Duration: 26 minutes  Technical aspects: This EEG study was done with scalp electrodes positioned according to the 10-20 International system of electrode placement. Electrical activity was reviewed with band pass filter of 1-70Hz , sensitivity of 7 uV/mm, display speed of 73mm/sec with a 60Hz  notched filter applied as appropriate. EEG data were recorded continuously and digitally stored.   Description of the recording: The background rhythms of this recording consists of a low amplitude beta activity. As the record progresses, the patient initially is in the waking state, but appears to enter the early stage II sleep during the recording, with rudimentary sleep spindles and vertex sharp wave activity seen. During the wakeful state, photic stimulation was performed, and no abnormal responses were seen. Hyperventilation was not performed. No epileptiform discharges seen during this recording but sharply contoured waves seen in the temporal region. There was no focal slowing.   Abnormality: None   Impression: This is a normal EEG recording in the waking and sleeping state. No evidence of interictal epileptiform discharges seen. The beta activity seen in this recording in nonspecific and can be related to medication side effect.  Normal EEGs, however, do not rule out epilepsy.    Windell Norfolk, MD [facility]

## 2022-10-08 ENCOUNTER — Encounter: Payer: Self-pay | Admitting: Family Medicine

## 2022-10-09 ENCOUNTER — Encounter: Payer: Self-pay | Admitting: Family Medicine

## 2022-10-09 DIAGNOSIS — D3132 Benign neoplasm of left choroid: Secondary | ICD-10-CM | POA: Diagnosis not present

## 2022-10-09 DIAGNOSIS — Z961 Presence of intraocular lens: Secondary | ICD-10-CM | POA: Diagnosis not present

## 2022-10-09 DIAGNOSIS — H04123 Dry eye syndrome of bilateral lacrimal glands: Secondary | ICD-10-CM | POA: Diagnosis not present

## 2022-10-09 DIAGNOSIS — H401112 Primary open-angle glaucoma, right eye, moderate stage: Secondary | ICD-10-CM | POA: Diagnosis not present

## 2022-10-10 ENCOUNTER — Telehealth: Payer: Self-pay | Admitting: Psychiatry

## 2022-10-10 NOTE — Telephone Encounter (Signed)
CTA ab/pelvis [employer] auth: [id number] exp. 10/10/22-12/09/22 CTA chest cohere auth: [id number] exp. 10/10/22-12/09/22 for GI

## 2022-10-16 ENCOUNTER — Telehealth: Payer: Self-pay

## 2022-10-16 NOTE — Telephone Encounter (Signed)
Patient calls nurse line requesting same day appointment for evaluation of BP.   Reports recent BP readings of 182/78, 174/68. She is currently asymptomatic.   States that last night BP was in the 130's systolic. Unsure of diastolic BP.   She reports that BP has been fluctuating for the last several weeks. Advised that she schedule appointment for further evaluation.   Dr. Jennette Kettle does not have any availability until the beginning of October. Scheduled patient for tomorrow morning in ATC.   ED precautions discussed.   Veronda Prude, RN

## 2022-10-17 ENCOUNTER — Ambulatory Visit: Payer: Medicare PPO | Admitting: Family Medicine

## 2022-10-17 VITALS — BP 162/68 | HR 60 | Ht 62.0 in | Wt 149.8 lb

## 2022-10-17 DIAGNOSIS — I10 Essential (primary) hypertension: Secondary | ICD-10-CM | POA: Diagnosis not present

## 2022-10-17 DIAGNOSIS — I773 Arterial fibromuscular dysplasia: Secondary | ICD-10-CM

## 2022-10-17 DIAGNOSIS — E034 Atrophy of thyroid (acquired): Secondary | ICD-10-CM | POA: Diagnosis not present

## 2022-10-17 DIAGNOSIS — Z23 Encounter for immunization: Secondary | ICD-10-CM

## 2022-10-17 MED ORDER — AMLODIPINE BESYLATE 5 MG PO TABS: ORAL_TABLET | ORAL | 0 refills | Status: DC

## 2022-10-17 NOTE — Progress Notes (Signed)
    SUBJECTIVE:   CHIEF COMPLAINT / HPI: blood pressure  Called clinic yesterday with elevated blood pressure readings to 180s over 70s.  Was asymptomatic at that time.  Reported blood pressure fluctuating for the past several weeks.  She takes amlodipine 2.5 mg. Taking daily. Brought in blood pressure. Does have a headache, temporal. No shortness of breath, chest pain, blurry vision.  Reports she was having persistent ocular headaches, and her ophthalmologist was concerned for a vascular etiology.  She then went and had a CT angio of her head done.  This showed concern for fibromuscular dysplasia.  PERTINENT  PMH / PSH: Essential hypertension, migraines, GERD, hypothyroidism  OBJECTIVE:   BP (!) 162/68   Pulse 60   Ht 5\' 2"  (1.575 m)   Wt 149 lb 12.8 oz (67.9 kg)   SpO2 100%   BMI 27.40 kg/m   General: NAD  Neuro: A&O Cardiovascular: RRR, no murmurs, no peripheral edema Respiratory: normal WOB on RA, CTAB, no wheezes, ronchi or rales Extremities: Moving all 4 extremities equally   ASSESSMENT/PLAN:   Assessment & Plan Essential hypertension Patient's blood pressure is not controlled today. BP: (!) 162/68. Goal of 140/90. Patient's medication regimen includes amlodipine 2.5 mg.  Per review of patient's blood pressure recording, patient has elevated systolics borderline low diastolics.  There is significant risk of decreasing her diastolic blood pressure with increase in her blood pressure medication.  Will increase amlodipine mildly with careful monitoring. -Changes to current regimen include increase to amlodipine 5 mg. -Consider referral to pharmacy clinic, if not improved at next visit. -Follow up in 3 weeks -Check TSH  Hypothyroidism due to acquired atrophy of thyroid Taking Synthroid 75 mcg daily.  Last TSH 2022 within normal limits.  Recheck today, adjust as indicated.  If low, Synthroid dosage may be contributing to hypertension. -Check TSH Encounter for  immunization COVID and flu vaccine today. Fibromuscular dysplasia (HCC) CT angio of the neck showed beaded appearance of cervical internal carotid arteries suggesting fibromuscular dysplasia. However, imaging showed patent intracranial vasculature without significant stenosis.    Suspect that this is genetic and longstanding, likely a significant contributor to her hypertension over the years. Likely mild case as she she has not significant cardiovascular events up to this point. Will follow along with patient's work-up via her Neurologist.  -Follow-up CT Angio Abd.  -See above change for blood pressure.   Return in about 3 weeks (around 11/07/2022).  Celine Mans, MD [facility]

## 2022-10-17 NOTE — Assessment & Plan Note (Signed)
Taking Synthroid 75 mcg daily.  Last TSH 2022 within normal limits.  Recheck today, adjust as indicated.  If low, Synthroid dosage may be contributing to hypertension. -Check TSH

## 2022-10-17 NOTE — Assessment & Plan Note (Signed)
Patient's blood pressure is not controlled today. BP: (!) 162/68. Goal of 140/90. Patient's medication regimen includes amlodipine 2.5 mg.  Per review of patient's blood pressure recording, patient has elevated systolics borderline low diastolics.  There is significant risk of decreasing her diastolic blood pressure with increase in her blood pressure medication.  Will increase amlodipine mildly with careful monitoring. -Changes to current regimen include increase to amlodipine 5 mg. -Consider referral to pharmacy clinic, if not improved at next visit. -Follow up in 3 weeks -Check TSH

## 2022-10-17 NOTE — Patient Instructions (Addendum)
It was great to see you! Thank you for allowing me to participate in your care!  Our plans for today:  - Please increase your Amlodipine to 5mg  daily.  Please keep a close eye on your blood pressure, if the bottom number is going below 60 please decrease amlodipine back to 2.5 mg daily. -Please record your blood pressure and bring into your visit with Dr. Jennette Kettle in 2 weeks. -I am checking your thyroid level today, I will let you know if it is abnormal and we need to adjust your thyroid medication dose.   Please arrive 15 minutes PRIOR to your next scheduled appointment time! If you do not, this affects OTHER patients' care.  Take care and seek immediate care sooner if you develop any concerns.   Celine Mans, MD, PGY-2 [facility] 8:43 AM 10/17/2022  [facility]

## 2022-10-18 LAB — TSH: TSH: 1.91 u[IU]/mL (ref 0.450–4.500)

## 2022-10-22 ENCOUNTER — Ambulatory Visit
Admission: RE | Admit: 2022-10-22 | Discharge: 2022-10-22 | Disposition: A | Discharge status: Home / Self Care | Payer: Medicare PPO | Source: Ambulatory Visit | Attending: Psychiatry | Admitting: Psychiatry

## 2022-10-22 DIAGNOSIS — I700 Atherosclerosis of aorta: Secondary | ICD-10-CM | POA: Diagnosis not present

## 2022-10-22 DIAGNOSIS — I773 Arterial fibromuscular dysplasia: Secondary | ICD-10-CM | POA: Diagnosis not present

## 2022-10-22 MED ORDER — IOPAMIDOL (ISOVUE-370) INJECTION 76%
80.0000 mL | Freq: Once | INTRAVENOUS | Status: AC | PRN
  Administered 2022-10-22: 80 mL via INTRAVENOUS

## 2022-10-27 ENCOUNTER — Other Ambulatory Visit: Payer: Self-pay | Admitting: Psychiatry

## 2022-10-27 DIAGNOSIS — H35363 Drusen (degenerative) of macula, bilateral: Secondary | ICD-10-CM | POA: Diagnosis not present

## 2022-10-27 DIAGNOSIS — I150 Renovascular hypertension: Secondary | ICD-10-CM

## 2022-10-27 DIAGNOSIS — H35453 Secondary pigmentary degeneration, bilateral: Secondary | ICD-10-CM | POA: Diagnosis not present

## 2022-10-27 DIAGNOSIS — H353131 Nonexudative age-related macular degeneration, bilateral, early dry stage: Secondary | ICD-10-CM | POA: Diagnosis not present

## 2022-10-27 DIAGNOSIS — D3132 Benign neoplasm of left choroid: Secondary | ICD-10-CM | POA: Diagnosis not present

## 2022-10-27 DIAGNOSIS — H401112 Primary open-angle glaucoma, right eye, moderate stage: Secondary | ICD-10-CM | POA: Diagnosis not present

## 2022-10-29 ENCOUNTER — Telehealth: Payer: Self-pay | Admitting: Psychiatry

## 2022-10-29 NOTE — Telephone Encounter (Signed)
Referral for nephrology fax to [facility]. Phone: [phone], Fax: [phone]

## 2022-11-05 ENCOUNTER — Ambulatory Visit: Payer: Medicare PPO | Admitting: Family Medicine

## 2022-11-05 VITALS — BP 138/70 | HR 69 | Temp 98.2°F | Ht 62.0 in | Wt 149.0 lb

## 2022-11-05 DIAGNOSIS — I10 Essential (primary) hypertension: Secondary | ICD-10-CM

## 2022-11-05 DIAGNOSIS — I773 Arterial fibromuscular dysplasia: Secondary | ICD-10-CM

## 2022-11-05 MED ORDER — AMLODIPINE BESYLATE 10 MG PO TABS: 10.0000 mg | ORAL_TABLET | Freq: Every day | ORAL | 3 refills | Status: DC

## 2022-11-05 NOTE — Patient Instructions (Signed)
  Call for an Appointment [phone] [facility] fibromuscular dysplaisa

## 2022-11-06 ENCOUNTER — Encounter: Payer: Self-pay | Admitting: Family Medicine

## 2022-11-06 DIAGNOSIS — I773 Arterial fibromuscular dysplasia: Secondary | ICD-10-CM | POA: Insufficient documentation

## 2022-11-06 NOTE — Progress Notes (Signed)
    CHIEF COMPLAINT / HPI:  Was seen by neurology for ocular migraine and during work up found to have (most likely) fibromuscular dysplasia (carotid and renal sytem affected) Most notable symptom has been elevtaed BP. She has been seen recently and placed on amlodipine which she has been taking without problem. Brings with her BO readings from  last few weeks. She is asymptomatic when BP is elevated.   PERTINENT  PMH / PSH: I have reviewed the patient's medications, allergies, past medical and surgical history, smoking status and updated in the EMR as appropriate. MRI brain: MRI brain with and without contrast demonstrating: - Few scattered subcortical foci of nonspecific T2 hyperintensities.  Some of these are associated with gliosis on T2 flair views, whereas other may represent small cystic lesions.  - No abnormal enhancement.  No acute findings. 2. CTA head and neck:. No acute intracranial pathology on initial noncontrast head CT. - Beaded appearance of the cervical internal carotid arteries suggesting fibromuscular dysplasia.  - No hemodynamically significant stenosis, occlusion, dissection, or aneurysm/pseudoaneurysm. - Patent intracranial vasculature without significant stenosis, occlusion, or vessel irregularity. 3. CTA  VASCULAR  -  Morphologic changes compatible with mild fibromuscular dysplasia of the right renal artery and minimal fibromuscular dysplasia of the left renal artery. No additional sites of fibromuscular dysplasia are evident.  Normal EEG 10/01/2022  OBJECTIVE:  BP 138/70   Pulse 69   Temp 98.2 F (36.8 C)   Ht 5\' 2"  (1.575 m)   Wt 149 lb (67.6 kg)   SpO2 99%   BMI 27.25 kg/m  GEN WD NAD CV RRR PSYCH: AxOx4. Good eye contact.. No psychomotor retardation or agitation. Appropriate speech fluency and content. Asks and answers questions appropriately. Mood is congruent.    ASSESSMENT / PLAN:   Fibromuscular dysplasia (HCC) Reviewed her records and  imaging with her. She would like a comprehensive approach so I gave her info on fibromuscular dysplaia clinic at [facility]. Should she need formal referral she will call me. Likely she will need to get discof actual images to take with her to appt and we discussed how to do that. Her only current issue from this appears to be her BP  I will see her back 1 m after specialist appt or sooner wit problems  Essential hypertension Increase amlodipine to 10 mg. She will send me additional BP readings via MyChart or mail. She has a wide pulse pressure so we need to be cautious about over treatment.   [name] MD

## 2022-11-06 NOTE — Assessment & Plan Note (Signed)
Reviewed her records and imaging with her. She would like a comprehensive approach so I gave her info on fibromuscular dysplaia clinic at [facility]. Should she need formal referral she will call me. Likely she will need to get discof actual images to take with her to appt and we discussed how to do that. Her only current issue from this appears to be her BP  I will see her back 1 m after specialist appt or sooner wit problems

## 2022-11-06 NOTE — Assessment & Plan Note (Signed)
Increase amlodipine to 10 mg. She will send me additional BP readings via MyChart or mail. She has a wide pulse pressure so we need to be cautious about over treatment.

## 2022-11-12 DIAGNOSIS — D3132 Benign neoplasm of left choroid: Secondary | ICD-10-CM | POA: Diagnosis not present

## 2022-11-12 NOTE — Telephone Encounter (Signed)
Received fax from [facility] requesting reason for the referral and patient's updated labs. 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 Labs-do not have updated labs for the patient.

## 2022-11-17 ENCOUNTER — Other Ambulatory Visit: Payer: Self-pay | Admitting: Family Medicine

## 2022-11-17 DIAGNOSIS — I773 Arterial fibromuscular dysplasia: Secondary | ICD-10-CM | POA: Diagnosis not present

## 2022-11-17 DIAGNOSIS — I10 Essential (primary) hypertension: Secondary | ICD-10-CM

## 2022-12-17 ENCOUNTER — Encounter: Payer: Self-pay | Admitting: Family Medicine

## 2022-12-22 MED ORDER — AMLODIPINE BESYLATE 5 MG PO TABS: 5.0000 mg | ORAL_TABLET | Freq: Every day | ORAL | Status: DC

## 2022-12-22 MED ORDER — INDAPAMIDE 1.25 MG PO TABS: 1.2500 mg | ORAL_TABLET | Freq: Every day | ORAL | 3 refills | Status: DC

## 2022-12-24 ENCOUNTER — Other Ambulatory Visit: Payer: Self-pay | Admitting: Family Medicine

## 2022-12-31 ENCOUNTER — Ambulatory Visit: Payer: Medicare PPO | Admitting: Neurology

## 2022-12-31 ENCOUNTER — Encounter: Payer: Self-pay | Admitting: Neurology

## 2022-12-31 VITALS — BP 118/66 | HR 64 | Ht 62.0 in | Wt 152.0 lb

## 2022-12-31 DIAGNOSIS — I773 Arterial fibromuscular dysplasia: Secondary | ICD-10-CM

## 2022-12-31 DIAGNOSIS — G43109 Migraine with aura, not intractable, without status migrainosus: Secondary | ICD-10-CM

## 2022-12-31 NOTE — Patient Instructions (Addendum)
-Start magnesium glycinate 500 mg daily  - How Does Magnesium Prevent Migraine? In 2012, the American Head[employer] Society and the American Academy of Neurology gave magnesium a Level B rating among medications used for migraine prevention. This rating means it is "probably effective" and is an option for migraine preventive treatment .  It's believed that magnesium can prevent the brain signaling that causes visual and sensory changes associated with aura. It can also reduce or block pain-transmitting chemicals in the brain and may be helpful for brain blood vessels.  Who Should Consider Magnesium? Because magnesium is extremely safe, anyone who experiences migraine may want to talk to their doctor about using it as a preventive treatment.  Due to the way magnesium works, it can be especially helpful for people who have migraine with aura.   Send to Dr. Corliss Skains for cerebral angiogram for "POSSIBLE" cerebral fibromnuscular dyplasia evaluation and diagnoses, beadingseen on ICA(Internal carotid arteries on CT of arteries but no stenosis may not be firormuscular dysplasia but if it is may need to keep an eye with yealry CTAs of yearly Cerebral angiograms per Dr. Corliss Skains.    Discussed:  There is increased risk for stroke in women with migraine with aura and a contraindication for the combined contraceptive pill for use by women who have migraine with aura. The risk for women with migraine without aura is lower. However other risk factors like smoking are far more likely to increase stroke risk than migraine. There is a recommendation for no smoking and for the use of OCPs without estrogen such as progestogen only pills particularly for women with migraine with aura.Vanessa Melton People who have migraine headaches with auras may be 3 times more likely to have a stroke caused by a blood clot, compared to migraine patients who don't see auras. Women who take hormone-replacement therapy may be 30 percent more likely to  suffer a clot-based stroke than women not taking medication containing estrogen. Other risk factors like smoking and high blood pressure may be  much more important. And stroke is still a rare complication due to migraine aura and is controversial and lower doses may not cause a risk.  If ever want preventative Lamictal is great for migraine aura(not for headaches0 and may prevent ocular migraines  Fibromuscular dysplasia (FMD) is a rare, chronic condition that causes abnormal cell growth in artery walls, leading to a number of possible complications:  Symptoms Symptoms vary depending on which arteries are affected, but can include headaches, high blood pressure, dizziness, a pulsing feeling in the ears, or sudden neck pain.  Complications FMD can lead to arterial stenosis (narrowing), aneurysms, dissection, or occlusion. If the arteries supplying blood to the brain are affected, a stroke or mini-stroke can occur.  Diagnosis FMD is often discovered by accident during an unrelated imaging test. It can be diagnosed using imaging techniques such as CT angiography or MRI angiography.  Treatment There is no cure for FMD, but it can be managed with medications or procedures to open up the blood vessels. For example, if FMD is accompanied by high blood pressure, medications such as ACE inhibitors can help relax blood vessels.  The 'Rare' Disease That Isn't - [facility] FMD is different from most other vascular diseases because it doesn't involve plaque buildup or inflammation. Instead, it's caused by a change in the composition of artery walls, where strong, flexible cells are replaced with less flexible, fibrous cells.   Magnesium Salts Capsules or Tablets What is this medication? MAGNESIUM (mag NEE  zee um) prevents and treats low levels of magnesium in your body. Magnesium plays an important role in maintaining the health of your muscles and nervous system. This medicine may be used for other purposes; ask  your health care provider or pharmacist if you have questions. COMMON BRAND NAME(S): Mag-G, MagOx 400, Magtrate, Vear Clock Cramp-Free What should I tell my care team before I take this medication? They need to know if you have any of these conditions: Heart disease Kidney disease An unusual or allergic reaction to magnesium, other medications, foods, dyes, or preservatives Pregnant or trying to get pregnant Breast-feeding How should I use this medication? Take by mouth with a glass of water. Follow the directions on the label. Do not take this medication more often than directed. Talk to your care team about the use of this medication in children. Special care may be needed. Overdosage: If you think you have taken too much of this medicine contact a poison control center or emergency room at once. NOTE: This medicine is only for you. Do not share this medicine with others. What if I miss a dose? If you miss a dose, take it as soon as you can. If it is almost time for your next dose, take only that dose. Do not take double or extra doses. What may interact with this medication? Antibiotics, such as ciprofloxacin, doxycycline, tetracycline Cefditoren Cellulose sodium phosphate Edetate disodium, disodium EDTA Medications for chest pain, such as digoxin, nifedipine Other magnesium-containing antacids, laxatives or supplements Sodium polystyrene sulfonate Vitamin D This list may not describe all possible interactions. Give your health care provider a list of all the medicines, herbs, non-prescription drugs, or dietary supplements you use. Also tell them if you smoke, drink alcohol, or use illegal drugs. Some items may interact with your medicine. What should I watch for while using this medication? Visit your care team for regular check-ups. See your care team if your symptoms do not get better or if they get worse. If you are scheduled for any medical or dental procedure, tell your care team  that you are taking this medication. You may need to stop taking this medication before the procedure. Herbal or dietary supplements are not regulated like medications. Rigid quality control standards are not required for dietary supplements. The purity and strength of these products can vary. The safety and effect of this dietary supplement for a certain disease or illness is not well known. This product is not intended to diagnose, treat, cure or prevent any disease. The Food and Drug Administration suggests the following to help consumers protect themselves: Always read product labels and follow directions. Natural does not mean a product is safe for humans to take. Look for products that include USP after the ingredient name. This means that the manufacturer followed the standards of the [place] Pharmacopoeia. Products made or sold by a nationally known food or drug company are more likely to be made under tight controls. You can write to the company for more information about how the product was made. What side effects may I notice from receiving this medication? Side effects that you should report to your care team as soon as possible: Allergic reactions--skin rash, itching, hives, swelling of the face, lips, tongue, or throat High magnesium level--confusion, drowsiness, facial flushing, redness, sweating, muscle weakness, fast or irregular heartbeat, trouble breathing Side effects that usually do not require medical attention (report to your care team if they continue or are bothersome): Diarrhea This list may not  describe all possible side effects. Call your doctor for medical advice about side effects. You may report side effects to FDA at 1-800-FDA-1088. Where should I keep my medication? Keep out of the reach of children. Store at room temperature or as directed on the package label. Protect from moisture. Throw away any unused medication after the expiration date. NOTE: This sheet is a  summary. It may not cover all possible information. If you have questions about this medicine, talk to your doctor, pharmacist, or health care provider.  2024 [employer]/Gold Standard (2020-10-03 00:00:00)

## 2022-12-31 NOTE — Progress Notes (Signed)
Referring:  Vanessa Ramp, MD [address] [place],  [place] [address]  PCP: Vanessa Ramp, MD  Cc: ocular migraines  HPI: December 31, 2022, she is here for follow-up, she is a patient of Dr. Lauris Melton is who has left the office and is transitioning to myself, Dr. Daisy Melton.  I reviewed Dr. Sherri Melton notes she presented for evaluation of vision changes 1 month prior to original appointment, fuzzy round black blob in the center of vision in her left eye also has flashing green-red and white lights in her vision.  This is not associated with headache.  Vision changes are persistent she has a constant spot in her left eye at all times.  She has had migraines with visual aura since she was a child, she has not had a migraine in the past 18 months, her previous migraine would last about an hour then fade away.  She takes Maxalt.  She has also tried Flexeril, Zofran, tramadol.  Dr. Lauris Melton ordered CTA and CT head, started gabapentin at bedtime, started magnesium glycine 8 500 mg daily and then considered MRI if symptoms do not improve despite treatment. She now has ocular migraines. She hs the ocular migraines at night, she has one right now in the left eye a blob, always the same. Lasts a few hours she just noticed it. 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 She takes rizatriptan and that helps and doesn't knock her out but if that doesn;t work she goes to urgent medical.  He spent a week in the hospital in a migraine clinic and was diagnosed with migraines. She is concerned about the fibromuscular dysplasia.   Reviewed labs and images:   Fundus Photography Color - Zeiss Clarus- OU - Both Eyes  Anatomical Region Laterality Modality  Head -- Other   Narrative  OD: drusen and reticular  pseudodrusen OS: drusen and reticular pseudodrusen, choroidal melanocytic lesion with drusen. No growth compared to previous photos. Exam End: 11/12/22 10:47 Last Resulted: 11/12/22 12:12  Received From: [facility]  Result Received: 11/17/22 12:55    CTA H&N:  Narrative & Impression  CLINICAL DATA:  Vision loss in left eye, vasospasm suspect.   EXAM: CT ANGIOGRAPHY HEAD AND NECK   TECHNIQUE: Multidetector CT imaging of the head and neck was performed using the standard protocol during bolus administration of intravenous contrast. Multiplanar CT image reconstructions and MIPs were obtained to evaluate the vascular anatomy. Carotid stenosis measurements (when applicable) are obtained utilizing NASCET criteria, using the distal internal carotid diameter as the denominator.   RADIATION DOSE REDUCTION: This exam was performed according to the departmental dose-optimization program which includes automated exposure control, adjustment of the mA and/or kV according to patient size and/or use of iterative reconstruction technique.   CONTRAST:  80mL ISOVUE-370 IOPAMIDOL (ISOVUE-370) INJECTION 76%   COMPARISON:  CT brain MRI   FINDINGS: CT HEAD FINDINGS   Brain: There is no acute intracranial hemorrhage, extra-axial fluid collection, or acute infarct   Parenchymal volume is normal. The ventricles are normal in size. Gray-white differentiation is preserved. 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   The pituitary and suprasellar region are normal. There  is no mass lesion. There is no mass effect or midline shift.   Vascular: See below.   Skull: Normal. Negative for fracture or focal lesion.   Sinuses/Orbits: The paranasal sinuses are clear. Bilateral lens implants are in place. The globes and orbits are otherwise unremarkable.   Other: The mastoid air cells and middle ear  cavities are clear.   Review of the MIP images confirms the above findings   CTA NECK FINDINGS   Aortic arch: The aortic arch is not included within the field of view. The origin of the left subclavian artery is patent. The other origins are not included within the field of view. The remainder of the subclavian arteries are patent to the level imaged.   Right carotid system: The right common, internal, and external carotid arteries are patent, with mild plaque at the bifurcation but no hemodynamically significant stenosis or occlusion. There is a beaded appearance of the internal carotid artery suggesting fibromuscular dysplasia. There is no evidence of dissection or aneurysm/pseudoaneurysm.   Left carotid system: The left common, internal, and external carotid arteries are patent, without hemodynamically significant stenosis or occlusion. There is a beaded appearance of the internal carotid artery suggesting fibromuscular dysplasia. There is no evidence of aneurysm/pseudoaneurysm.   Vertebral arteries: The vertebral arteries are patent, without hemodynamically significant stenosis or occlusion. There is no evidence of beading of the vertebral arteries. There is no evidence of dissection or aneurysm/pseudoaneurysm.   Skeleton: There is no acute osseous abnormality or suspicious osseous lesion. There is moderate to advanced multilevel disc degeneration throughout the cervical spine. There is no visible canal hematoma.   Other neck: The soft tissues of the neck are unremarkable.   Upper chest: The imaged lung apices are clear.   Review of the MIP images confirms the above findings   CTA HEAD FINDINGS   Anterior circulation: There is a minimal calcified plaque in the intracranial ICAs without significant stenosis or occlusion.   The bilateral MCAs and ACAS are patent, without vessel irregularity, stenosis, or occlusion.   There is no aneurysm or AVM.   Posterior  circulation: The bilateral V4 segments are patent. The basilar artery is patent. The major cerebellar arteries appear patent.   The bilateral PCAs are patent, without vessel irregularity, stenosis, or occlusion. Posterior communicating arteries are not definitely seen.   There is no aneurysm or AVM.   Venous sinuses: Patent.   Anatomic variants: None.   Review of the MIP images confirms the above findings   IMPRESSION: 1. No acute intracranial pathology on initial noncontrast head CT. 2. Beaded appearance of the cervical internal carotid arteries suggesting fibromuscular dysplasia. No hemodynamically significant stenosis, occlusion, dissection, or aneurysm/pseudoaneurysm. 3. Patent intracranial vasculature without significant stenosis, occlusion, or vessel irregularity  09/17/2022:  FINDINGS:    No abnormal lesions are seen on diffusion-weighted views to suggest acute ischemia. The cortical sulci, fissures and cisterns are normal in size and appearance. Lateral, third and fourth ventricle are normal in size and appearance. No extra-axial fluid collections are seen. No evidence of mass effect or midline shift.  Few scattered periventricular subcortical foci of nonspecific T2 hyperintensities.  Some of these have associated gliosis on T2 flair, but others followed CSF signal intensity and may represent small cystic foci in the bilateral caudate and corona radiata regions.  No abnormal lesions are seen on post contrast views.     On sagittal views the posterior fossa, pituitary gland and corpus callosum are unremarkable. No evidence of intracranial  hemorrhage on SWI views. The orbits and their contents, paranasal sinuses and calvarium are unremarkable. Bilateral lens extractions. Intracranial flow voids are present.     IMPRESSION:    MRI brain with and without contrast demonstrating: - Few scattered subcortical foci of nonspecific T2 hyperintensities.  Some of these are associated with  gliosis on T2 flair views, whereas other may represent small cystic lesions.  - No abnormal enhancement.  No acute findings.  Neurology was asked to evaluate Vanessa Melton, an 89 year old female for a chief complaint of ocular migraines.  Our recommendations of care will be communicated by shared medical record.    CC:  ocular migraines  History provided from self  HPI:  Medical co-morbidities: HTN, MVP, hypothyroidism, aortic atherosclerosis  The patient presents for evaluation of vision changes which began 1 month ago. She describes this as a "fuzzy round black blob" in the center of her vision in her left eye. Also has flashing green, red, and white lights in her vision. This is not associated with headache. Vision changes are persistent and she has a constant spot in her left eye at all times, though it may fluctuate in intensity.  She has had migraine with visual aura since she was a child. She has not had a migraine headache for the past 18 months. Her previous migraine auras would last for ~1 hour, then fade away.   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 Discussed vision changes with her eye doctor who did not believe symptoms were related to the tumor.  Current Treatment: Abortive Maxalt 10 mg PRN  Preventative none  Prior Therapies                                 Rescue: Maxalt 10 mg PRN - sedation Cyclobenzaprine Tramadol Zofran  Prevention: Losartan 100 mg daily Lisinopril - cough Beta blockers - chest pain, dizziness amlodipine   LABS: CBC    Component Value Date/Time   WBC 5.7 05/08/2020 1042   WBC 8.1 05/03/2018 0247   RBC 4.59 05/08/2020 1042   RBC 4.80 05/03/2018 0247   HGB 13.4 05/08/2020 1042   HCT 40.8 05/08/2020 1042   PLT 187 05/08/2020 1042   MCV 89 05/08/2020 1042   MCH 29.2 05/08/2020 1042   MCH 28.8 05/03/2018 0247   MCHC 32.8 05/08/2020 1042   MCHC 31.7 05/03/2018 0247   RDW 12.9 05/08/2020 1042   LYMPHSABS 1.7  09/11/2011 1205   MONOABS 0.4 09/11/2011 1205   EOSABS 0.1 09/11/2011 1205   BASOSABS 0.0 09/11/2011 1205      Latest Ref Rng & Units 10/09/2021   11:55 AM 05/08/2020   10:42 AM 05/03/2018    2:47 AM  CMP  Glucose 70 - 99 mg/dL 85  95  098   BUN 8 - 27 mg/dL 15  9  13    Creatinine 0.57 - 1.00 mg/dL 1.19  1.47  8.29   Sodium 134 - 144 mmol/L 137  142  139   Potassium 3.5 - 5.2 mmol/L 4.4  4.1  3.8   Chloride 96 - 106 mmol/L 97  104  107   CO2 20 - 29 mmol/L 23  23  22    Calcium 8.7 - 10.3 mg/dL 56.2  9.7  13.0   Total Protein 6.0 - 8.5 g/dL 6.3  6.6    Total Bilirubin 0.0 - 1.2 mg/dL 0.3  0.4    Alkaline Phos 44 - 121 IU/L 70  71    AST 0 - 40 IU/L 23  18    ALT 0 - 32 IU/L 20  16       IMAGING:  CT[facility] 10/2020: no acute intracranial process  Imaging independently reviewed on January 04, 2023   Current Outpatient Medications on File Prior to Visit  Medication Sig Dispense Refill   amLODipine (NORVASC) 5 MG tablet Take 1 tablet (5 mg total) by mouth daily.     levothyroxine (SYNTHROID) 75 MCG tablet TAKE 1 TABLET(75 MCG) BY MOUTH DAILY 90 tablet 3   Multiple Vitamins-Minerals (ICAPS AREDS 2 PO) Take 1 tablet by mouth 2 (two) times daily.     omeprazole (PRILOSEC) 40 MG capsule TAKE 1 CAPSULE BY MOUTH 20 MINUTES BEFORE BREAKFAST     OVER THE COUNTER MEDICATION Apply 1 application topically daily as needed (pain). CBD cream     rizatriptan (MAXALT) 10 MG tablet Take 1 tablet (10 mg total) by mouth as needed for migraine. May repeat in 2 hours if needed 12 tablet 3   aspirin EC 81 MG tablet Take by mouth.     Current Facility-Administered Medications on File Prior to Visit  Medication Dose Route Frequency Provider Last Rate Last Admin   ondansetron (ZOFRAN-ODT) disintegrating tablet 4 mg  4 mg Oral Once Bess Kinds, MD         Allergies: Allergies  Allergen Reactions   Beta Adrenergic Blockers Shortness Of Breath and Other (See Comments)    Chest pain, dizziness    Candesartan Cilexetil-Hctz     Dyspnea on exertion   Codeine Shortness Of Breath and Rash    Headache   Penicillins Shortness Of Breath and Rash    Headache Did it involve swelling of the face/tongue/throat, SOB, or low BP? Yes Did it involve sudden or severe rash/hives, skin peeling, or any reaction on the inside of your mouth or nose? No Did you need to seek medical attention at a hospital or doctor's office? Unknown When did it last happen?      childhood allergy If all above answers are "NO", may proceed with cephalosporin use.    Ace Inhibitors Cough   Combigan [Brimonidine Tartrate-Timolol]     Chest pain   Doxazosin     Unknown reaction   Other     Steroids - swelling, weight gain     Family History: Family History  Problem Relation Age of Onset   Colon cancer Mother    Emphysema Father    Aneurysm Brother    Stroke Brother    Colon polyps Daughter    Colon cancer Brother      Past Medical History: Past Medical History:  Diagnosis Date   Allergy    Aortic atherosclerosis (HCC) 05/03/2018   Arthritis    Atypical chest pain 05/03/2018   Cataract    GERD (gastroesophageal reflux disease)    Hypertension    Hypothyroidism    Mitral valve prolapse 05/03/2018   Nausea and vomiting 06/17/2022   Right knee pain 01/03/2015   Right wrist injury 01/07/2018    Past Surgical History Past Surgical History:  Procedure Laterality Date   CATARACT EXTRACTION W/ INTRAOCULAR LENS IMPLANT     COSMETIC SURGERY     EYE MUSCLE SURGERY     EYE SURGERY     FRACTURE SURGERY      Social History: Social History   Tobacco Use   Smoking status:  Former    Current packs/day: 0.00    Types: Cigarettes    Start date: 02/04/1952    Quit date: 02/03/1957    Years since quitting: 65.9    Passive exposure: Past   Smokeless tobacco: Never   Tobacco comments:    no plan to start again  Vaping Use   Vaping status: Never Used  Substance Use Topics   Alcohol use: Yes    Alcohol/week:  2.0 standard drinks of alcohol    Types: 2 Glasses of wine per week    Comment: social   Drug use: Yes    Types: Marijuana    ROS: Patient complains of symptoms per HPI as well as the following symptoms: neg . Pertinent negatives and positives per HPI. All others negative    Physical Exam:   Physical exam: Exam: Gen: NAD, conversant, well nourised, obese, well groomed                     CV: RRR, no MRG. No Carotid Bruits. No peripheral edema, warm, nontender Eyes: Conjunctivae clear without exudates or hemorrhage  Neuro: Detailed Neurologic Exam  Speech:    Speech is normal; fluent and spontaneous with normal comprehension.  Cognition:    The patient is oriented to person, place, and time;     recent and remote memory intact;     language fluent;     normal attention, concentration,     fund of knowledge Cranial Nerves:    The pupils are equal, round, and reactive to light. Attempted pupils too small to visualizeVisual fields are full to finger confrontation. Extraocular movements are intact. Trigeminal sensation is intact and the muscles of mastication are normal. The face is symmetric. The palate elevates in the midline. Hearing intact. Voice is normal. Shoulder shrug is normal. The tongue has normal motion without fasciculations.   Coordination: nml  Gait: nml  Motor Observation:    No asymmetry, no atrophy, and no involuntary movements noted. Tone:    Normal muscle tone.    Posture:    Posture is normal. normal erect    Strength:    Strength is V/V in the upper and lower limbs.      Sensation: intact to LT     Reflex Exam:  DTR's:    Deep tendon reflexes in the upper and lower extremities are symmetrical bilaterally.   Toes:    The toes are downgoing bilaterally.   Clonus:    Clonus is absent.  IMPRESSION: 89 year old female with a history of HTN, MVP, hypothyroidism, aortic atherosclerosis who presents for evaluation of vision changes in her left  eye for the past month. Likely ocular migraines and posisbly ICA fibromuscular dysplasia which she is very worried about, we can send for eval to Dr. Corliss Skains for cerebral angigram as clinically warranted by him.   CTA: Beaded appearance of the cervical internal carotid arteries suggesting fibromuscular dysplasia. No hemodynamically significant stenosis, occlusion, dissection, or aneurysm/pseudoaneurysm.  -Start magnesium glycinate 500 mg daily  - How Does Magnesium Prevent Migraine? In 2012, the American Headache Society and the American Academy of Neurology gave magnesium a Level B rating among medications used for migraine prevention. This rating means it is "probably effective" and is an option for migraine preventive treatment .  It's believed that magnesium can prevent the brain signaling that causes visual and sensory changes associated with aura. It can also reduce or block pain-transmitting chemicals in the brain and may be helpful  for brain blood vessels.  Who Should Consider Magnesium? Because magnesium is extremely safe, anyone who experiences migraine may want to talk to their doctor about using it as a preventive treatment.  Due to the way magnesium works, it can be especially helpful for people who have migraine with aura.   Send to Dr. Corliss Skains for cerebral angiogram for "POSSIBLE" cerebral fibromnuscular dyplasia evaluation and diagnoses, beadingseen on ICA(Internal carotid arteries on CT of arteries but no stenosis may not be firormuscular dysplasia but if it is may need to keep an eye with yealry CTAs of yearly Cerebral angiograms per Dr. Corliss Skains.    Discussed:  There is increased risk for stroke in women with migraine with aura and a contraindication for the combined contraceptive pill for use by women who have migraine with aura. The risk for women with migraine without aura is lower. However other risk factors like smoking are far more likely to increase stroke risk  than migraine. There is a recommendation for no smoking and for the use of OCPs without estrogen such as progestogen only pills particularly for women with migraine with aura.Marland Kitchen People who have migraine headaches with auras may be 3 times more likely to have a stroke caused by a blood clot, compared to migraine patients who don't see auras. Women who take hormone-replacement therapy may be 30 percent more likely to suffer a clot-based stroke than women not taking medication containing estrogen. Other risk factors like smoking and high blood pressure may be  much more important. And stroke is still a rare complication due to migraine aura and is controversial and lower doses may not cause a risk.  If ever want preventative Lamictal is great for migraine aura(not for headaches0 and may prevent ocular migraines  Discusses Fibromuscular dysplasia (FMD) is a rare, chronic condition that causes abnormal cell growth in artery walls, leading to a number of possible complications:  Symptoms Symptoms vary depending on which arteries are affected, but can include headaches, high blood pressure, dizziness, a pulsing feeling in the ears, or sudden neck pain.  Complications FMD can lead to arterial stenosis (narrowing), aneurysms, dissection, or occlusion. If the arteries supplying blood to the brain are affected, a stroke or mini-stroke can occur.  Diagnosis FMD is often discovered by accident during an unrelated imaging test. It can be diagnosed using imaging techniques such as CT angiography or MRI angiography.  Treatment There is no cure for FMD, but it can be managed with medications or procedures to open up the blood vessels. For example, if FMD is accompanied by high blood pressure, medications such as ACE inhibitors can help relax blood vessels.  The 'Rare' Disease That Isn't - WSJ FMD is different from most other vascular diseases because it doesn't involve plaque buildup or inflammation. Instead, it's  caused by a change in the composition of artery walls, where strong, flexible cells are replaced with less flexible, fibrous cells.   -Start magnesium glycinate 500 mg daily  - How Does Magnesium Prevent Migraine? In 2012, the American Headache Society and the American Academy of Neurology gave magnesium a Level B rating among medications used for migraine prevention. This rating means it is "probably effective" and is an option for migraine preventive treatment .  It's believed that magnesium can prevent the brain signaling that causes visual and sensory changes associated with aura. It can also reduce or block pain-transmitting chemicals in the brain and may be helpful for brain blood vessels.  Who Should Consider Magnesium? Because  magnesium is extremely safe, anyone who experiences migraine may want to talk to their doctor about using it as a preventive treatment.  Due to the way magnesium works, it can be especially helpful for people who have migraine with aura.   Send to Dr. Corliss Skains for cerebral angiogram for "POSSIBLE" cerebral fibromnuscular dyplasia evaluation and diagnoses, beadingseen on ICA(Internal carotid arteries on CT of arteries but no stenosis may not be firormuscular dysplasia but if it is may need to keep an eye with yealry CTAs of yearly Cerebral angiograms per Dr. Corliss Skains.    Discussed:  There is increased risk for stroke in women with migraine with aura and a contraindication for the combined contraceptive pill for use by women who have migraine with aura. The risk for women with migraine without aura is lower. However other risk factors like smoking are far more likely to increase stroke risk than migraine. There is a recommendation for no smoking and for the use of OCPs without estrogen such as progestogen only pills particularly for women with migraine with aura.Marland Kitchen People who have migraine headaches with auras may be 3 times more likely to have a stroke caused by a  blood clot, compared to migraine patients who don't see auras. Women who take hormone-replacement therapy may be 30 percent more likely to suffer a clot-based stroke than women not taking medication containing estrogen. Other risk factors like smoking and high blood pressure may be  much more important. And stroke is still a rare complication due to migraine aura and is controversial and lower doses may not cause a risk.  Orders Placed This Encounter  Procedures   Ambulatory referral to Interventional Radiology   No orders of the defined types were placed in this encounter.    [name] MD, MD 01/04/2023   12:31 PM  I spent over 50 minutes of face-to-face and non-face-to-face time with patient on the  1. Fibromuscular dysplasia of carotid artery (HCC)   2. Ocular migraine    diagnosis.  This included previsit chart review, lab review, study review, order entry, electronic health record documentation, patient education on the different diagnostic and therapeutic options, counseling and coordination of care, risks and benefits of management, compliance, or risk factor reduction

## 2023-01-04 ENCOUNTER — Encounter: Payer: Self-pay | Admitting: Neurology

## 2023-01-04 DIAGNOSIS — G43109 Migraine with aura, not intractable, without status migrainosus: Secondary | ICD-10-CM | POA: Insufficient documentation

## 2023-01-05 ENCOUNTER — Other Ambulatory Visit: Payer: Self-pay | Admitting: Family Medicine

## 2023-01-06 ENCOUNTER — Other Ambulatory Visit (HOSPITAL_COMMUNITY): Payer: Self-pay | Admitting: Interventional Radiology

## 2023-01-06 DIAGNOSIS — I773 Arterial fibromuscular dysplasia: Secondary | ICD-10-CM

## 2023-01-06 MED ORDER — OMEPRAZOLE 40 MG PO CPDR: 40.0000 mg | DELAYED_RELEASE_CAPSULE | Freq: Every day | ORAL | 3 refills | Status: DC

## 2023-01-07 ENCOUNTER — Telehealth: Payer: Self-pay | Admitting: Family Medicine

## 2023-01-08 ENCOUNTER — Other Ambulatory Visit: Payer: Self-pay | Admitting: Family Medicine

## 2023-01-08 MED ORDER — AMLODIPINE BESYLATE 5 MG PO TABS: 5.0000 mg | ORAL_TABLET | Freq: Every day | ORAL | 0 refills | Status: DC

## 2023-01-08 NOTE — Telephone Encounter (Signed)
Amlodipine refilled.

## 2023-01-08 NOTE — Telephone Encounter (Signed)
Patient calls nurse line requesting a refill on Amlodipine.   She reports she was taking 10mg , however was decreased to 5mg . She reports she has been cutting her 10mg  in half to finish out her bottle.   She is requesting Amlodipine 5mg  to be sent to the pharmacy.   Advised with forward to PCP.

## 2023-01-14 ENCOUNTER — Other Ambulatory Visit: Payer: Self-pay | Admitting: Radiology

## 2023-01-14 DIAGNOSIS — I773 Arterial fibromuscular dysplasia: Secondary | ICD-10-CM

## 2023-01-15 ENCOUNTER — Ambulatory Visit (HOSPITAL_COMMUNITY)
Admission: RE | Admit: 2023-01-15 | Discharge: 2023-01-15 | Disposition: A | Discharge status: Home / Self Care | Payer: Medicare PPO | Source: Ambulatory Visit | Attending: Interventional Radiology | Admitting: Interventional Radiology

## 2023-01-15 ENCOUNTER — Encounter (HOSPITAL_COMMUNITY): Payer: Self-pay

## 2023-01-15 ENCOUNTER — Other Ambulatory Visit (HOSPITAL_COMMUNITY): Payer: Self-pay | Admitting: Interventional Radiology

## 2023-01-15 ENCOUNTER — Other Ambulatory Visit: Payer: Self-pay

## 2023-01-15 DIAGNOSIS — I773 Arterial fibromuscular dysplasia: Secondary | ICD-10-CM | POA: Insufficient documentation

## 2023-01-15 DIAGNOSIS — Z87891 Personal history of nicotine dependence: Secondary | ICD-10-CM | POA: Diagnosis not present

## 2023-01-15 DIAGNOSIS — Z01818 Encounter for other preprocedural examination: Secondary | ICD-10-CM | POA: Insufficient documentation

## 2023-01-15 DIAGNOSIS — Z79899 Other long term (current) drug therapy: Secondary | ICD-10-CM | POA: Insufficient documentation

## 2023-01-15 DIAGNOSIS — G43809 Other migraine, not intractable, without status migrainosus: Secondary | ICD-10-CM | POA: Diagnosis not present

## 2023-01-15 DIAGNOSIS — I6523 Occlusion and stenosis of bilateral carotid arteries: Secondary | ICD-10-CM | POA: Diagnosis not present

## 2023-01-15 HISTORY — PX: IR ANGIO INTRA EXTRACRAN SEL COM CAROTID INNOMINATE BILAT MOD SED: IMG5360

## 2023-01-15 HISTORY — PX: IR ANGIO VERTEBRAL SEL VERTEBRAL BILAT MOD SED: IMG5369

## 2023-01-15 LAB — BASIC METABOLIC PANEL
Anion gap: 12 (ref 5–15)
BUN: 19 mg/dL (ref 8–23)
CO2: 23 mmol/L (ref 22–32)
Calcium: 9.6 mg/dL (ref 8.9–10.3)
Chloride: 103 mmol/L (ref 98–111)
Creatinine, Ser: 0.92 mg/dL (ref 0.44–1.00)
GFR, Estimated: 60 mL/min — ABNORMAL LOW (ref >60)
Glucose, Bld: 104 mg/dL — ABNORMAL HIGH (ref 70–99)
Potassium: 3.7 mmol/L (ref 3.5–5.1)
Sodium: 138 mmol/L (ref 135–145)

## 2023-01-15 LAB — CBC
HCT: 39.1 % (ref 36.0–46.0)
Hemoglobin: 12.8 g/dL (ref 12.0–15.0)
MCH: 29 pg (ref 26.0–34.0)
MCHC: 32.7 g/dL (ref 30.0–36.0)
MCV: 88.5 fL (ref 80.0–100.0)
Platelets: 234 10*3/uL (ref 150–400)
RBC: 4.42 MIL/uL (ref 3.87–5.11)
RDW: 13.6 % (ref 11.5–15.5)
WBC: 6.3 10*3/uL (ref 4.0–10.5)
nRBC: 0 % (ref 0.0–0.2)

## 2023-01-15 LAB — PROTIME-INR
INR: 1 (ref 0.8–1.2)
Prothrombin Time: 13.7 s (ref 11.4–15.2)

## 2023-01-15 MED ORDER — SODIUM CHLORIDE 0.9 % IV SOLN: INTRAVENOUS | Status: AC

## 2023-01-15 MED ORDER — FENTANYL CITRATE (PF) 100 MCG/2ML IJ SOLN
INTRAMUSCULAR | Status: AC
  Filled 2023-01-15: qty 2

## 2023-01-15 MED ORDER — MIDAZOLAM HCL 2 MG/2ML IJ SOLN
INTRAMUSCULAR | Status: AC
  Filled 2023-01-15: qty 2

## 2023-01-15 MED ORDER — IOHEXOL 300 MG/ML  SOLN
50.0000 mL | Freq: Once | INTRAMUSCULAR | Status: AC | PRN
  Administered 2023-01-15: 19 mL via INTRA_ARTERIAL

## 2023-01-15 MED ORDER — FENTANYL CITRATE (PF) 100 MCG/2ML IJ SOLN
INTRAMUSCULAR | Status: AC | PRN
  Administered 2023-01-15: 25 ug via INTRAVENOUS

## 2023-01-15 MED ORDER — SODIUM CHLORIDE 0.9% FLUSH: 10.0000 mL | Freq: Two times a day (BID) | INTRAVENOUS | Status: DC

## 2023-01-15 MED ORDER — MIDAZOLAM HCL 2 MG/2ML IJ SOLN
INTRAMUSCULAR | Status: AC | PRN
Start: 2023-01-15 — End: 2023-01-15
  Administered 2023-01-15: 1 mg via INTRAVENOUS

## 2023-01-15 MED ORDER — FENTANYL CITRATE (PF) 100 MCG/2ML IJ SOLN
INTRAMUSCULAR | Status: AC | PRN
  Administered 2023-01-15 (×2): 25 ug via INTRAVENOUS

## 2023-01-15 MED ORDER — LIDOCAINE HCL 1 % IJ SOLN
20.0000 mL | Freq: Once | INTRAMUSCULAR | Status: AC
  Administered 2023-01-15: 10 mL via INTRADERMAL

## 2023-01-15 MED ORDER — LIDOCAINE HCL 1 % IJ SOLN
INTRAMUSCULAR | Status: AC
  Filled 2023-01-15: qty 20

## 2023-01-15 MED ORDER — SODIUM CHLORIDE 0.9 % IV BOLUS
INTRAVENOUS | Status: AC | PRN
  Administered 2023-01-15: 250 mL via INTRAVENOUS

## 2023-01-15 MED ORDER — HEPARIN SODIUM (PORCINE) 1000 UNIT/ML IJ SOLN
INTRAMUSCULAR | Status: AC | PRN
  Administered 2023-01-15: 1000 [IU] via INTRAVENOUS

## 2023-01-15 MED ORDER — IOHEXOL 300 MG/ML  SOLN
150.0000 mL | Freq: Once | INTRAMUSCULAR | Status: AC | PRN
  Administered 2023-01-15: 100 mL via INTRA_ARTERIAL

## 2023-01-15 MED ORDER — HEPARIN SODIUM (PORCINE) 1000 UNIT/ML IJ SOLN
INTRAMUSCULAR | Status: AC
  Filled 2023-01-15: qty 1

## 2023-01-15 NOTE — H&P (Signed)
Chief Complaint: Patient was seen in consultation today for cerebral arteriogram.  at the request of Vanessa Dean, MD.  Referring Physician(s): Vanessa Dean, MD.  Supervising Physician: Vanessa Melton  Patient Status: [facility] - Out-pt  Full Code  History of Present Illness: Vanessa Melton is a 89 y.o. female who presents today for a cerebral arteriogram at the request of Dr. Naomie Melton. Patient's last visit with Dr. Lucia Melton was 12/31/22. Patient reports having daily ocular migraines, which have been present for the last few months. Patient is currently prescribed maxalt for these migraines. Patient otherwise reports that she feels well today. Denies: chest pain, shortness of breath, and abdominal pain.  Past Medical History:  Diagnosis Date   Allergy    Aortic atherosclerosis (HCC) 05/03/2018   Arthritis    Atypical chest pain 05/03/2018   Cataract    GERD (gastroesophageal reflux disease)    Hypertension    Hypothyroidism    Mitral valve prolapse 05/03/2018   Nausea and vomiting 06/17/2022   Right knee pain 01/03/2015   Right wrist injury 01/07/2018    Past Surgical History:  Procedure Laterality Date   CATARACT EXTRACTION W/ INTRAOCULAR LENS IMPLANT     COSMETIC SURGERY     EYE MUSCLE SURGERY     EYE SURGERY     FRACTURE SURGERY      Allergies: Beta adrenergic blockers, Candesartan cilexetil-hctz, Codeine, Penicillins, Ace inhibitors, Combigan [brimonidine tartrate-timolol], Doxazosin, and Other  Medications: Prior to Admission medications   Medication Sig Start Date End Date Taking? Authorizing Provider  amLODipine (NORVASC) 5 MG tablet Take 1 tablet (5 mg total) by mouth daily. 01/08/23  Yes Billey Co, MD  aspirin EC 81 MG tablet Take by mouth.   Yes [provider]  levothyroxine (SYNTHROID) 75 MCG tablet TAKE 1 TABLET(75 MCG) BY MOUTH DAILY 08/12/22  Yes Nestor Ramp, MD  Multiple Vitamins-Minerals (ICAPS AREDS 2 PO) Take 1 tablet by mouth  2 (two) times daily.   Yes [provider]  omeprazole (PRILOSEC) 40 MG capsule Take 1 capsule (40 mg total) by mouth daily. 01/06/23  Yes Nestor Ramp, MD  OVER THE COUNTER MEDICATION Apply 1 application topically daily as needed (pain). CBD cream   Yes [provider]  rizatriptan (MAXALT) 10 MG tablet Take 1 tablet (10 mg total) by mouth as needed for migraine. May repeat in 2 hours if needed 05/13/17   Nestor Ramp, MD     Family History  Problem Relation Age of Onset   Colon cancer Mother    Emphysema Father    Aneurysm Brother    Stroke Brother    Colon polyps Daughter    Colon cancer Brother     Social History   Socioeconomic History   Marital status: Significant Other    Spouse name: Not on file   Number of children: 3   Years of education: 21   Highest education level: Doctorate  Occupational History   Occupation: retired professor    Comment: [employer]  Tobacco Use   Smoking status: Former    Current packs/day: 0.00    Types: Cigarettes    Start date: 02/04/1952    Quit date: 02/03/1957    Years since quitting: 65.9    Passive exposure: Past   Smokeless tobacco: Never   Tobacco comments:    no plan to start again  Vaping Use   Vaping status: Never Used  Substance and Sexual Activity   Alcohol  use: Yes    Alcohol/week: 2.0 standard drinks of alcohol    Types: 2 Glasses of wine per week    Comment: social   Drug use: Yes    Types: Marijuana   Sexual activity: Yes    Birth control/protection: None  Other Topics Concern   Not on file  Social History Narrative   Significant other: Dr, Vanessa Melton   Home is split level, 6 stairs. Handrails on stairs, has smoke alarms and fire extinguisher.   Exercises 5+ days a week 1-1.5 hours per day   Golfs   Shows horses--Arabian female named BOGO (after buying the mare she got him free--buy one get one=BOGO)   [address]   Has an adopted Medical laboratory scientific officer.   Travels frequently   Wears  seat belts in vehicles.      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   Social Drivers of Health   Financial Resource Strain: Low Risk  (03/21/2022)   Overall Financial Resource Strain (CARDIA)    Difficulty of Paying Living Expenses: Not hard at all  Food Insecurity: No Food Insecurity (03/21/2022)   Hunger Vital Sign    Worried About Running Out of Food in the Last Year: Never true    Ran Out of Food in the Last Year: Never true  Transportation Needs: No Transportation Needs (03/21/2022)   PRAPARE - Administrator, Civil Service (Medical): No    Lack of Transportation (Non-Medical): No  Physical Activity: Sufficiently Active (03/21/2022)   Exercise Vital Sign    Days of Exercise per Week: 4 days    Minutes of Exercise per Session: 50 min  Stress: No Stress Concern Present (03/21/2022)   [employer] of Occupational Health - Occupational Stress Questionnaire    Feeling of Stress : Not at all  Social Connections: Socially Integrated (03/21/2022)   Social Connection and Isolation Panel [NHANES]    Frequency of Communication with Friends and Family: More than three times a week    Frequency of Social Gatherings with Friends and Family: More than three times a week    Attends Religious Services: More than 4 times per year    Active Member of [employer] or Organizations: Yes    Attends Engineer, structural: More than 4 times per year    Marital Status: Living with partner      Review of Systems: A 12 point ROS discussed and pertinent positives are indicated in the HPI above.  All other systems are negative.  Review of Systems  Constitutional:  Negative for fever.  Eyes:  Positive for visual disturbance.  Respiratory:  Negative for shortness of breath.   Cardiovascular:  Negative for chest pain.  Neurological:  Positive for headaches.    Vital Signs: BP (!) 143/56   Pulse 68   Temp 97.9 F (36.6 C) (Oral)   Resp 16   Ht 5\' 2"  (1.575 m)   Wt 150 lb  (68 kg)   SpO2 95%   BMI 27.44 kg/m   Advance Care Plan: The advanced care plan/surrogate decision maker was discussed at the time of visit and documented in the medical record.    Physical Exam HENT:     Head: Normocephalic.     Mouth/Throat:     Mouth: Mucous membranes are moist.     Pharynx: Oropharynx is clear.  Cardiovascular:     Rate and Rhythm: Normal rate and regular rhythm.  Pulmonary:     Effort:  Pulmonary effort is normal.     Breath sounds: Normal breath sounds.  Abdominal:     General: Abdomen is flat.  Skin:    General: Skin is warm.     Findings: Rash (lateral portion of the left upper chest.) present.          Comments: Linear moist erythematous patch from the left shoulder towards the sternum. There are a few scattered vesicles.  Neurological:     Mental Status: She is alert and oriented to person, place, and time.  Psychiatric:        Thought Content: Thought content normal.        Judgment: Judgment normal.     Imaging: No results found.  Labs:  CBC: Recent Labs    01/15/23 0728  WBC 6.3  HGB 12.8  HCT 39.1  PLT 234    COAGS: Recent Labs    01/15/23 0728  INR 1.0    BMP: Recent Labs    01/15/23 0728  NA 138  K 3.7  CL 103  CO2 23  GLUCOSE 104*  BUN 19  CALCIUM 9.6  CREATININE 0.92  GFRNONAA 60*    LIVER FUNCTION TESTS: No results for input(s): "BILITOT", "AST", "ALT", "ALKPHOS", "PROT", "ALBUMIN" in the last 8760 hours.  TUMOR MARKERS: No results for input(s): "AFPTM", "CEA", "CA199", "CHROMGRNA" in the last 8760 hours.  Assessment and Plan:  Patient is scheduled today for a cerebral arteriogram.   Patient's rash was evaluated by Dr. Corliss Skains. Dr. Corliss Skains approved to proceed with the procedure.  Risks and benefits of cerebral angiogram with intervention were discussed with the patient including, but not limited to bleeding, infection, vascular injury, contrast induced renal failure, stroke or even  death.  This interventional procedure involves the use of X-rays and because of the nature of the planned procedure, it is possible that we will have prolonged use of X-ray fluoroscopy.  Potential radiation risks to you include (but are not limited to) the following: - A slightly elevated risk for cancer  several years later in life. This risk is typically less than 0.5% percent. This risk is low in comparison to the normal incidence of human cancer, which is 33% for women and 50% for men according to the American Cancer Society. - Radiation induced injury can include skin redness, resembling a rash, tissue breakdown / ulcers and hair loss (which can be temporary or permanent).   The likelihood of either of these occurring depends on the difficulty of the procedure and whether you are sensitive to radiation due to previous procedures, disease, or genetic conditions.   IF your procedure requires a prolonged use of radiation, you will be notified and given written instructions for further action.  It is your responsibility to monitor the irradiated area for the 2 weeks following the procedure and to notify your physician if you are concerned that you have suffered a radiation induced injury.    All of the patient's questions were answered, patient is agreeable to proceed.  Consent signed and in chart.   Thank you for this interesting consult.  I greatly enjoyed meeting PHARA CANTEY and look forward to participating in their care.  A copy of this report was sent to the requesting provider on this date.  Electronically Signed: Rosalita Levan, PA 01/15/2023, 8:47 AM   I spent a total of 30 Minutes   in face to face in clinical consultation, greater than 50% of which was counseling/coordinating care for a cerebral  arteriogram.

## 2023-01-15 NOTE — Sedation Documentation (Signed)
S/p procedure patient complained of 10/10 pain while holding femoral pressure. Pain medication administered as per order. Patient fainted. Pulses palpable. MD called to bedside. Desaturation mitigated with increased oxygenation. Lower blood pressure addressed with reverse trendelenburg and ns fluid bolus (see flowsheet). Vital signs responded to interventions patient is now at baseline. Femoral site continues to be soft, no hematoma. Dressing clean dry and intact. MD Dev assessed patient and explained findings at bedside to patient and husband. Husband and patient confirmed understanding. All belongings transported with patient.

## 2023-01-15 NOTE — Progress Notes (Signed)
Bedside report and assessment performed with receiving nurse. Patient alert and oriented. Patient able to follow commands. Patient airway and iv patent. Patient denies any s/s of pain or discomfort. None observed. Vascular site verified at bedside with receiving nurse. No hematoma present. Dressing clean  dry and intact. Pulses verified as palpable. VSS. All belongings with patient. Receiving nurse denies having any other questions.

## 2023-01-15 NOTE — Procedures (Signed)
INR.  Status post four-vessel cerebral arteriogram.  Right CFA approach.  Findings.  1.  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  No evidence of significant stenosis is noted at either site.   Patient tolerated the procedure well.  There were no acute complications  Vanessa Sanger MD.

## 2023-01-19 ENCOUNTER — Telehealth: Payer: Self-pay | Admitting: Student

## 2023-01-19 NOTE — Telephone Encounter (Signed)
Interventional Radiology Brief Note:  Patient called with questions about increasing exercise after recent angiogram.  Patient s/p cerebral angiogram with Dr. Corliss Skains 01/15/23.  She asks when she can begin exercising again.  States that she does have bruising to the groin, but denies lump or knots.  No extension of the bruising.  Feels that it is slowing getting better.  She is on aspirin 81mg  alone.  Encouraged patient to continue with ADLs and limited activity as tolerated.  Slowly increase activity with monitoring of her groin site.  May resume light exercise by end of week if continues to heal well.   Vanessa Dys, MS RD PA-C

## 2023-02-10 DIAGNOSIS — L814 Other melanin hyperpigmentation: Secondary | ICD-10-CM | POA: Diagnosis not present

## 2023-02-10 DIAGNOSIS — L570 Actinic keratosis: Secondary | ICD-10-CM | POA: Diagnosis not present

## 2023-02-10 DIAGNOSIS — L821 Other seborrheic keratosis: Secondary | ICD-10-CM | POA: Diagnosis not present

## 2023-02-13 DIAGNOSIS — I129 Hypertensive chronic kidney disease with stage 1 through stage 4 chronic kidney disease, or unspecified chronic kidney disease: Secondary | ICD-10-CM | POA: Diagnosis not present

## 2023-02-13 DIAGNOSIS — I773 Arterial fibromuscular dysplasia: Secondary | ICD-10-CM | POA: Diagnosis not present

## 2023-02-13 DIAGNOSIS — E039 Hypothyroidism, unspecified: Secondary | ICD-10-CM | POA: Diagnosis not present

## 2023-02-13 DIAGNOSIS — K219 Gastro-esophageal reflux disease without esophagitis: Secondary | ICD-10-CM | POA: Diagnosis not present

## 2023-02-27 ENCOUNTER — Other Ambulatory Visit: Payer: Self-pay | Admitting: Family Medicine

## 2023-03-10 DIAGNOSIS — L814 Other melanin hyperpigmentation: Secondary | ICD-10-CM | POA: Diagnosis not present

## 2023-03-10 DIAGNOSIS — L821 Other seborrheic keratosis: Secondary | ICD-10-CM | POA: Diagnosis not present

## 2023-03-10 DIAGNOSIS — L570 Actinic keratosis: Secondary | ICD-10-CM | POA: Diagnosis not present

## 2023-03-23 ENCOUNTER — Other Ambulatory Visit: Payer: Self-pay

## 2023-03-23 MED ORDER — AMLODIPINE BESYLATE 5 MG PO TABS: 5.0000 mg | ORAL_TABLET | Freq: Every day | ORAL | 3 refills | Status: DC

## 2023-03-30 ENCOUNTER — Encounter: Payer: Self-pay | Admitting: Family Medicine

## 2023-04-06 ENCOUNTER — Ambulatory Visit: Payer: Medicare PPO

## 2023-04-06 VITALS — Ht 62.0 in | Wt 154.0 lb

## 2023-04-06 DIAGNOSIS — Z0000 Encounter for general adult medical examination without abnormal findings: Secondary | ICD-10-CM | POA: Diagnosis not present

## 2023-04-06 NOTE — Progress Notes (Signed)
 Subjective:   Vanessa Melton is a 89 y.o. who presents for a Medicare Wellness preventive visit.  Visit Complete: Virtual I connected with  Vanessa Melton on 04/06/23 by a audio enabled telemedicine application and verified that I am speaking with the correct person using two identifiers.  Patient Location: Home  Provider Location: Office/Clinic  I discussed the limitations of evaluation and management by telemedicine. The patient expressed understanding and agreed to proceed.  Vital Signs: Because this visit was a virtual/telehealth visit, some criteria may be missing or patient reported. Any vitals not documented were not able to be obtained and vitals that have been documented are patient reported.  VideoDeclined- This patient declined Librarian, academic. Therefore the visit was completed with audio only.  AWV Questionnaire: No: Patient Medicare AWV questionnaire was not completed prior to this visit.  Cardiac Risk Factors include: advanced age (>13men, >67 women);hypertension     Objective:    Today's Vitals   04/06/23 1124  Weight: 154 lb (69.9 kg)  Height: 5\' 2"  (1.575 m)  PainSc: 0-No pain   Body mass index is 28.17 kg/m.     04/06/2023   11:26 AM 01/15/2023    7:35 AM 11/05/2022    9:32 AM 10/17/2022    8:44 AM 06/16/2022   11:01 AM 03/21/2022   11:19 AM 10/09/2021   10:38 AM  Advanced Directives  Does Patient Have a Medical Advance Directive? Yes Yes No No Yes Yes No  Type of Estate agent of [place];Living will Healthcare Power of [place];Living will   Healthcare Power of [place];Living will Healthcare Power of [place];Living will   Does patient want to make changes to medical advance directive?     No - Patient declined No - Patient declined   Copy of Healthcare Power of Attorney in Chart? No - copy requested     No - copy requested     Current Medications (verified) Outpatient Encounter Medications as  of 04/06/2023  Medication Sig   amLODipine (NORVASC) 5 MG tablet Take 1 tablet (5 mg total) by mouth daily.   aspirin EC 81 MG tablet Take by mouth.   levothyroxine (SYNTHROID) 75 MCG tablet TAKE 1 TABLET(75 MCG) BY MOUTH DAILY   Multiple Vitamins-Minerals (ICAPS AREDS 2 PO) Take 1 tablet by mouth 2 (two) times daily.   omeprazole (PRILOSEC) 40 MG capsule TAKE ONE TABLET BY MOUTH EVERY MORNING   OVER THE COUNTER MEDICATION Apply 1 application topically daily as needed (pain). CBD cream   rizatriptan (MAXALT) 10 MG tablet Take 1 tablet (10 mg total) by mouth as needed for migraine. May repeat in 2 hours if needed   Facility-Administered Encounter Medications as of 04/06/2023  Medication   ondansetron (ZOFRAN-ODT) disintegrating tablet 4 mg    Allergies (verified) Beta adrenergic blockers, Candesartan cilexetil-hctz, Codeine, Penicillins, Ace inhibitors, Combigan [brimonidine tartrate-timolol], Doxazosin, and Other   History: Past Medical History:  Diagnosis Date   Allergy    Aortic atherosclerosis (HCC) 05/03/2018   Arthritis    Atypical chest pain 05/03/2018   Cataract    GERD (gastroesophageal reflux disease)    Hypertension    Hypothyroidism    Mitral valve prolapse 05/03/2018   Nausea and vomiting 06/17/2022   Right knee pain 01/03/2015   Right wrist injury 01/07/2018   Past Surgical History:  Procedure Laterality Date   CATARACT EXTRACTION W/ INTRAOCULAR LENS IMPLANT     COSMETIC SURGERY     EYE MUSCLE SURGERY  EYE SURGERY     FRACTURE SURGERY     IR ANGIO INTRA EXTRACRAN SEL COM CAROTID INNOMINATE BILAT MOD SED  01/15/2023   IR ANGIO VERTEBRAL SEL VERTEBRAL BILAT MOD SED  01/15/2023   Family History  Problem Relation Age of Onset   Colon cancer Mother    Emphysema Father    Aneurysm Brother    Stroke Brother    Colon polyps Daughter    Colon cancer Brother    Social History   Socioeconomic History   Marital status: Significant Other    Spouse name: Not on file    Number of children: 3   Years of education: 21   Highest education level: Doctorate  Occupational History   Occupation: retired professor    Melton: [employer]  Tobacco Use   Smoking status: Former    Current packs/day: 0.00    Types: Cigarettes    Start date: 02/04/1952    Quit date: 02/03/1957    Years since quitting: 66.2    Passive exposure: Past   Smokeless tobacco: Never   Tobacco comments:    no plan to start again  Vaping Use   Vaping status: Never Used  Substance and Sexual Activity   Alcohol use: Yes    Alcohol/week: 2.0 standard drinks of alcohol    Types: 2 Glasses of wine per week    Melton: social   Drug use: Yes    Types: Marijuana   Sexual activity: Yes    Birth control/protection: None  Other Topics Concern   Not on file  Social History Narrative   Significant other: Dr, [name]   Home is split level, 6 stairs. Handrails on stairs, has smoke alarms and fire extinguisher.   Exercises 5+ days a week 1-1.5 hours per day   Golfs   Shows horses--Arabian female named BOGO (after buying the mare she got him free--buy one get one=BOGO)   All [address] [place]   Has an adopted Medical laboratory scientific officer.   Travels frequently   Wears seat belts in vehicles.      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   Social Drivers of Health   Financial Resource Strain: Low Risk  (04/06/2023)   Overall Financial Resource Strain (CARDIA)    Difficulty of Paying Living Expenses: Not hard at all  Food Insecurity: No Food Insecurity (04/06/2023)   Hunger Vital Sign    Worried About Running Out of Food in the Last Year: Never true    Ran Out of Food in the Last Year: Never true  Transportation Needs: No Transportation Needs (04/06/2023)   PRAPARE - Administrator, Civil Service (Medical): No    Lack of Transportation (Non-Medical): No  Physical Activity: Sufficiently Active (04/06/2023)   Exercise Vital Sign    Days of Exercise per Week: 5 days    Minutes of  Exercise per Session: 60 min  Stress: No Stress Concern Present (04/06/2023)   [employer] of Occupational Health - Occupational Stress Questionnaire    Feeling of Stress : Not at all  Social Connections: Socially Integrated (04/06/2023)   Social Connection and Isolation Panel [NHANES]    Frequency of Communication with Friends and Family: More than three times a week    Frequency of Social Gatherings with Friends and Family: More than three times a week    Attends Religious Services: More than 4 times per year    Active Member of [employer] or Organizations: Yes  Attends Engineer, structural: More than 4 times per year    Marital Status: Living with partner    Tobacco Counseling Counseling given: Not Answered Tobacco comments: no plan to start again    Clinical Intake:  Pre-visit preparation completed: Yes  Pain : No/denies pain Pain Score: 0-No pain     BMI - recorded: 28.17 Nutritional Status: BMI 25 -29 Overweight Nutritional Risks: None Diabetes: No  How often do you need to have someone help you when you read instructions, pamphlets, or other written materials from your doctor or pharmacy?: 1 - Never What is the last grade level you completed in school?: PH.D  Interpreter Needed?: No  Information entered by :: Natayla Cadenhead N. Malayia Spizzirri, LPN.   Activities of Daily Living     04/06/2023   11:30 AM  In your present state of health, do you have any difficulty performing the following activities:  Hearing? 0  Vision? 0  Difficulty concentrating or making decisions? 0  Walking or climbing stairs? 0  Dressing or bathing? 0  Doing errands, shopping? 0  Preparing Food and eating ? N  Using the Toilet? N  In the past six months, have you accidently leaked urine? N  Do you have problems with loss of bowel control? N  Managing your Medications? N  Managing your Finances? N  Housekeeping or managing your Housekeeping? N    Patient Care Team: Nestor Ramp, MD as  PCP - General (Family Medicine) Materin, Huey Romans, MD as Referring Physician (Ophthalmology) Saralyn Pilar, MD as Referring Physician (Ophthalmology) Carmela Rima, MD as Consulting Physician (Ophthalmology) Julieanne Cotton, MD as Consulting Physician (Interventional Radiology) Antony Contras, MD as Consulting Physician (Ophthalmology)  Indicate any recent Medical Services you may have received from other than [facility] providers in the past year (date may be approximate).     Assessment:   This is a routine wellness examination for Vanessa Melton.  Hearing/Vision screen Hearing Screening - Comments:: Denies hearing difficulties. No hearing aids.  Vision Screening - Comments:: No eyeglasses - up to date with routine eye exams with  Glaucoma: Antony Contras, MD. [facility]:  Left Eye: [facility] Retinal Specialist: [facility] (Dr. Arrie Aran)     Goals Addressed             This Visit's Progress    MY GOAL FOR 2025 IS TO LOSE WEIGHT.         Depression Screen     04/06/2023   11:30 AM 11/05/2022    9:33 AM 10/17/2022    8:44 AM 06/16/2022   11:02 AM 03/21/2022   10:45 AM 10/09/2021   10:37 AM 06/26/2021    9:20 AM  PHQ 2/9 Scores  PHQ - 2 Score 0 0 0 0 0 0 0  PHQ- 9 Score 0 0 0 0  0 0    Fall Risk     04/06/2023   11:29 AM 10/17/2022    8:44 AM 06/16/2022   11:02 AM 03/21/2022   10:48 AM 10/09/2021   10:38 AM  Fall Risk   Falls in the past year? 0 0 0 0 0  Number falls in past yr: 0 0 0 0 0  Injury with Fall? 0 0 0 0   Risk for fall due to : No Fall Risks  No Fall Risks Impaired vision;No Fall Risks   Follow up Falls prevention discussed;Falls evaluation completed  Falls prevention discussed Falls evaluation completed Falls  evaluation completed    MEDICARE RISK AT HOME:  Medicare Risk at Home Any stairs in or around the home?: No If so, are there any without handrails?: No Home free of loose throw rugs in walkways, pet beds,  electrical cords, etc?: Yes Adequate lighting in your home to reduce risk of falls?: Yes Life alert?: No Use of a cane, walker or w/c?: No Grab bars in the bathroom?: Yes Shower chair or bench in shower?: Yes (built in seat) Elevated toilet seat or a handicapped toilet?: No  TIMED UP AND GO:  Was the test performed?  No  Cognitive Function: 6CIT completed    04/06/2023   11:30 AM 11/30/2017    2:34 PM  MMSE - Mini Mental State Exam  Not completed: Unable to complete   Orientation to time  5  Orientation to Place  5  Registration  3  Attention/ Calculation  5  Recall  3  Language- name 2 objects  2  Language- repeat  1  Language- follow 3 step command  3  Language- read & follow direction  1  Write a sentence  1  Copy design  1  Total score  30        04/06/2023   11:30 AM 03/21/2022   10:49 AM 11/30/2017    2:35 PM  6CIT Screen  What Year? 0 points 0 points 0 points  What month? 0 points 3 points 0 points  What time? 0 points 0 points 0 points  Count back from 20 0 points 0 points 0 points  Months in reverse 0 points 0 points 0 points  Repeat phrase 0 points 0 points 0 points  Total Score 0 points 3 points 0 points    Immunizations Immunization History  Administered Date(s) Administered   Fluad Trivalent(High Dose 65+) 10/17/2022   Influenza, High Dose Seasonal PF 12/09/2017, 11/23/2018, 11/20/2021   PFIZER Comirnaty(Gray Top)Covid-19 Tri-Sucrose Vaccine 06/13/2020   PFIZER(Purple Top)SARS-COV-2 Vaccination 02/15/2019, 03/07/2019, 11/27/2020   PNEUMOCOCCAL CONJUGATE-20 06/13/2020   Pfizer Covid-19 Vaccine Bivalent Booster 31yrs & up 06/26/2021   Pfizer(Comirnaty)Fall Seasonal Vaccine 12 years and older 10/17/2022   Pneumococcal Conjugate-13 05/21/2016   RSV,unspecified 11/20/2021   Td 02/04/2004, 04/28/2014   Zoster, Live 04/03/2012    Screening Tests Health Maintenance  Topic Date Due   Zoster Vaccines- Shingrix (1 of 2) 07/26/1984   Colonoscopy   08/23/2023   Medicare Annual Wellness (AWV)  04/05/2024   DTaP/Tdap/Td (3 - Tdap) 04/27/2024   Pneumonia Vaccine 42+ Years old  Completed   INFLUENZA VACCINE  Completed   DEXA SCAN  Completed   COVID-19 Vaccine  Completed   HPV VACCINES  Aged Out    Health Maintenance  Health Maintenance Due  Topic Date Due   Zoster Vaccines- Shingrix (1 of 2) 07/26/1984   Health Maintenance Items Addressed: Yes  Additional Screening:  Vision Screening: Recommended annual ophthalmology exams for early detection of glaucoma and other disorders of the eye.  Dental Screening: Recommended annual dental exams for proper oral hygiene  Community Resource Referral / Chronic Care Management: CRR required this visit?  No   CCM required this visit?  No     Plan:     I have personally reviewed and noted the following in the patient's chart:   Medical and social history Use of alcohol, tobacco or illicit drugs  Current medications and supplements including opioid prescriptions. Patient is not currently taking opioid prescriptions. Functional ability and status Nutritional status Physical activity  Advanced directives List of other physicians Hospitalizations, surgeries, and ER visits in previous 12 months Vitals Screenings to include cognitive, depression, and falls Referrals and appointments  In addition, I have reviewed and discussed with patient certain preventive protocols, quality metrics, and best practice recommendations. A written personalized care plan for preventive services as well as general preventive health recommendations were provided to patient.     Mickeal Needy, LPN   02/08/1094   After Visit Summary: (MyChart) Due to this being a telephonic visit, the after visit summary with patients personalized plan was offered to patient via MyChart   Notes: Nothing significant to report at this time.

## 2023-04-06 NOTE — Patient Instructions (Signed)
 Ms. Rudell , Thank you for taking time to come for your Medicare Wellness Visit. I appreciate your ongoing commitment to your health goals. Please review the following plan we discussed and let me know if I can assist you in the future.   Referrals/Orders/Follow-Ups/Clinician Recommendations: Yes; Keep maintaining your health by keeping your appointments with Dr. Jennette Kettle and any specialists that you may see.  Call us if you need anything.  Have a great year!!!!  This is a list of the screening recommended for you and due dates:  Health Maintenance  Topic Date Due   Zoster (Shingles) Vaccine (1 of 2) 07/26/1984   Colon Cancer Screening  08/23/2023   Medicare Annual Wellness Visit  04/05/2024   DTaP/Tdap/Td vaccine (3 - Tdap) 04/27/2024   Pneumonia Vaccine  Completed   Flu Shot  Completed   DEXA scan (bone density measurement)  Completed   COVID-19 Vaccine  Completed   HPV Vaccine  Aged Out    Advanced directives: (Copy Requested) Please bring a copy of your health care power of attorney and living will to the office to be added to your chart at your convenience.  Next Medicare Annual Wellness Visit scheduled for next year: Yes

## 2023-04-20 DIAGNOSIS — R609 Edema, unspecified: Secondary | ICD-10-CM | POA: Diagnosis not present

## 2023-04-20 DIAGNOSIS — I773 Arterial fibromuscular dysplasia: Secondary | ICD-10-CM | POA: Diagnosis not present

## 2023-04-20 DIAGNOSIS — I10 Essential (primary) hypertension: Secondary | ICD-10-CM | POA: Diagnosis not present

## 2023-04-20 DIAGNOSIS — G43909 Migraine, unspecified, not intractable, without status migrainosus: Secondary | ICD-10-CM | POA: Diagnosis not present

## 2023-04-27 DIAGNOSIS — D3132 Benign neoplasm of left choroid: Secondary | ICD-10-CM | POA: Diagnosis not present

## 2023-04-27 DIAGNOSIS — H401112 Primary open-angle glaucoma, right eye, moderate stage: Secondary | ICD-10-CM | POA: Diagnosis not present

## 2023-04-27 DIAGNOSIS — H35363 Drusen (degenerative) of macula, bilateral: Secondary | ICD-10-CM | POA: Diagnosis not present

## 2023-04-27 DIAGNOSIS — H353131 Nonexudative age-related macular degeneration, bilateral, early dry stage: Secondary | ICD-10-CM | POA: Diagnosis not present

## 2023-04-30 DIAGNOSIS — H04123 Dry eye syndrome of bilateral lacrimal glands: Secondary | ICD-10-CM | POA: Diagnosis not present

## 2023-04-30 DIAGNOSIS — Z961 Presence of intraocular lens: Secondary | ICD-10-CM | POA: Diagnosis not present

## 2023-04-30 DIAGNOSIS — H401131 Primary open-angle glaucoma, bilateral, mild stage: Secondary | ICD-10-CM | POA: Diagnosis not present

## 2023-05-11 ENCOUNTER — Encounter: Payer: Self-pay | Admitting: Family Medicine

## 2023-05-15 ENCOUNTER — Other Ambulatory Visit: Payer: Self-pay | Admitting: Family Medicine

## 2023-06-15 DIAGNOSIS — R519 Headache, unspecified: Secondary | ICD-10-CM | POA: Diagnosis not present

## 2023-06-15 DIAGNOSIS — I773 Arterial fibromuscular dysplasia: Secondary | ICD-10-CM | POA: Diagnosis not present

## 2023-06-15 DIAGNOSIS — Z13220 Encounter for screening for lipoid disorders: Secondary | ICD-10-CM | POA: Diagnosis not present

## 2023-06-18 DIAGNOSIS — Z13220 Encounter for screening for lipoid disorders: Secondary | ICD-10-CM | POA: Diagnosis not present

## 2023-06-18 DIAGNOSIS — I773 Arterial fibromuscular dysplasia: Secondary | ICD-10-CM | POA: Diagnosis not present

## 2023-06-25 DIAGNOSIS — I773 Arterial fibromuscular dysplasia: Secondary | ICD-10-CM | POA: Diagnosis not present

## 2023-08-17 ENCOUNTER — Other Ambulatory Visit: Payer: Self-pay

## 2023-08-17 ENCOUNTER — Ambulatory Visit (INDEPENDENT_AMBULATORY_CARE_PROVIDER_SITE_OTHER)

## 2023-08-17 ENCOUNTER — Encounter: Payer: Self-pay | Admitting: Family Medicine

## 2023-08-17 ENCOUNTER — Ambulatory Visit: Admitting: Family Medicine

## 2023-08-17 VITALS — BP 128/70 | HR 75 | Ht 62.0 in | Wt 156.0 lb

## 2023-08-17 DIAGNOSIS — M25532 Pain in left wrist: Secondary | ICD-10-CM

## 2023-08-17 DIAGNOSIS — M2550 Pain in unspecified joint: Secondary | ICD-10-CM | POA: Diagnosis not present

## 2023-08-17 DIAGNOSIS — M1812 Unilateral primary osteoarthritis of first carpometacarpal joint, left hand: Secondary | ICD-10-CM | POA: Diagnosis not present

## 2023-08-17 DIAGNOSIS — M19032 Primary osteoarthritis, left wrist: Secondary | ICD-10-CM | POA: Diagnosis not present

## 2023-08-17 DIAGNOSIS — M8580 Other specified disorders of bone density and structure, unspecified site: Secondary | ICD-10-CM | POA: Diagnosis not present

## 2023-08-17 LAB — COMPREHENSIVE METABOLIC PANEL WITH GFR
ALT: 14 U/L (ref 0–35)
AST: 16 U/L (ref 0–37)
Albumin: 4.3 g/dL (ref 3.5–5.2)
Alkaline Phosphatase: 56 U/L (ref 39–117)
BUN: 15 mg/dL (ref 6–23)
CO2: 28 meq/L (ref 19–32)
Calcium: 9.8 mg/dL (ref 8.4–10.5)
Chloride: 99 meq/L (ref 96–112)
Creatinine, Ser: 0.82 mg/dL (ref 0.40–1.20)
GFR: 63.58 mL/min (ref >60.00)
Glucose, Bld: 113 mg/dL — ABNORMAL HIGH (ref 70–99)
Potassium: 3.3 meq/L — ABNORMAL LOW (ref 3.5–5.1)
Sodium: 135 meq/L (ref 135–145)
Total Bilirubin: 0.5 mg/dL (ref 0.2–1.2)
Total Protein: 6.6 g/dL (ref 6.0–8.3)

## 2023-08-17 LAB — CBC WITH DIFFERENTIAL/PLATELET
Basophils Absolute: 0 K/uL (ref 0.0–0.1)
Basophils Relative: 0.3 % (ref 0.0–3.0)
Eosinophils Absolute: 0.1 K/uL (ref 0.0–0.7)
Eosinophils Relative: 0.8 % (ref 0.0–5.0)
HCT: 39.8 % (ref 36.0–46.0)
Hemoglobin: 13.2 g/dL (ref 12.0–15.0)
Lymphocytes Relative: 17.2 % (ref 12.0–46.0)
Lymphs Abs: 1.6 K/uL (ref 0.7–4.0)
MCHC: 33.3 g/dL (ref 30.0–36.0)
MCV: 86.8 fl (ref 78.0–100.0)
Monocytes Absolute: 0.8 K/uL (ref 0.1–1.0)
Monocytes Relative: 7.9 % (ref 3.0–12.0)
Neutro Abs: 7.1 K/uL (ref 1.4–7.7)
Neutrophils Relative %: 73.8 % (ref 43.0–77.0)
Platelets: 200 K/uL (ref 150.0–400.0)
RBC: 4.58 Mil/uL (ref 3.87–5.11)
RDW: 14.5 % (ref 11.5–15.5)
WBC: 9.6 K/uL (ref 4.0–10.5)

## 2023-08-17 LAB — VITAMIN D 25 HYDROXY (VIT D DEFICIENCY, FRACTURES): VITD: 24.14 ng/mL — ABNORMAL LOW (ref 30.00–100.00)

## 2023-08-17 LAB — SEDIMENTATION RATE: Sed Rate: 9 mm/h (ref 0–30)

## 2023-08-17 LAB — VITAMIN B12: Vitamin B-12: 156 pg/mL — ABNORMAL LOW (ref 211–911)

## 2023-08-17 MED ORDER — TRAMADOL HCL 50 MG PO TABS: 50.0000 mg | ORAL_TABLET | Freq: Two times a day (BID) | ORAL | 0 refills | Status: AC | PRN

## 2023-08-17 NOTE — Assessment & Plan Note (Signed)
 Significant swelling of the joint noted.  We discussed potential aspiration but I do concern with patient's history of fibromuscular dysplasia I do feel we need further evaluation to make sure there is no significant dissection that is occurring.  I do think that there is a chance that there is an insufficiency fracture of the wrist as well but difficult to assess secondary to the osteopenia on the x-rays today.  Discussed with patient about icing regimen of home exercises.  Increase activity slowly.  Follow-up with me again after bracing for 2 weeks and getting the CT angiogram worsening pain to seek medical attention.  Tramadol  prescribed.

## 2023-08-17 NOTE — Patient Instructions (Signed)
 IV hydration Tramadol  prescribed up to 2x a day with tylenol  You have 14 days to return or exchange your brace Call [phone], then return the brace to our office Brace day and night for 2 weeks. Try to wear as much as you can See you again in 2 weeks (okay to double)

## 2023-08-17 NOTE — Progress Notes (Signed)
 [facility] [address] [place] [address] Phone: [phone] Subjective:   Vanessa Melton, am serving as a scribe for Dr. Arthea Vanessa.  I'm seeing this patient by the request  of:  Rosalynn Camie CROME, MD  CC: Left wrist pain  YEP:Dlagzrupcz  Vanessa Melton is a 89 y.o. female coming in with complaint of L wrist pain. Woke up in the middle of night with wrist swollen and painful. Took some Tylenol  and applied some topical, but didn't help. Never has happened before. Not able to move finger causes pain on dorsal side of hand.       Past Medical History:  Diagnosis Date   Allergy    Aortic atherosclerosis (HCC) 05/03/2018   Arthritis    Atypical chest pain 05/03/2018   Cataract    GERD (gastroesophageal reflux disease)    Hypertension    Hypothyroidism    Mitral valve prolapse 05/03/2018   Nausea and vomiting 06/17/2022   Right knee pain 01/03/2015   Right wrist injury 01/07/2018   Past Surgical History:  Procedure Laterality Date   CATARACT EXTRACTION W/ INTRAOCULAR LENS IMPLANT     COSMETIC SURGERY     EYE MUSCLE SURGERY     EYE SURGERY     FRACTURE SURGERY     IR ANGIO INTRA EXTRACRAN SEL COM CAROTID INNOMINATE BILAT MOD SED  01/15/2023   IR ANGIO VERTEBRAL SEL VERTEBRAL BILAT MOD SED  01/15/2023   Social History   Socioeconomic History   Marital status: Significant Other    Spouse name: Not on file   Number of children: 3   Years of education: 21   Highest education level: Doctorate  Occupational History   Occupation: retired professor    Comment: [employer]  Tobacco Use   Smoking status: Former    Current packs/day: 0.00    Types: Cigarettes    Start date: 02/04/1952    Quit date: 02/03/1957    Years since quitting: 66.5    Passive exposure: Past   Smokeless tobacco: Never   Tobacco comments:    no plan to start again  Vaping Use   Vaping status: Never Used  Substance and Sexual Activity   Alcohol use: Yes     Alcohol/week: 2.0 standard drinks of alcohol    Types: 2 Glasses of wine per week    Comment: social   Drug use: Yes    Types: Marijuana   Sexual activity: Yes    Birth control/protection: None  Other Topics Concern   Not on file  Social History Narrative   Significant other: Dr, Alm Mac   Home is split level, 6 stairs. Handrails on stairs, has smoke alarms and fire extinguisher.   Exercises 5+ days a week 1-1.5 hours per day   Golfs   Shows horses--Arabian female named BOGO (after buying the mare she got him free--buy one get one=BOGO)   All [address] [place]   Has an adopted Medical laboratory scientific officer.   Travels frequently   Wears seat belts in vehicles.      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   Social Drivers of Health   Financial Resource Strain: Low Risk  (04/06/2023)   Overall Financial Resource Strain (CARDIA)    Difficulty of Paying Living Expenses: Not hard at all  Food Insecurity: No Food Insecurity (04/06/2023)   Hunger Vital Sign    Worried About Running Out of Food in the Last Year: Never  true    Ran Out of Food in the Last Year: Never true  Transportation Needs: No Transportation Needs (04/06/2023)   PRAPARE - Administrator, Civil Service (Medical): No    Lack of Transportation (Non-Medical): No  Physical Activity: Sufficiently Active (04/06/2023)   Exercise Vital Sign    Days of Exercise per Week: 5 days    Minutes of Exercise per Session: 60 min  Stress: No Stress Concern Present (04/06/2023)   [employer] of Occupational Health - Occupational Stress Questionnaire    Feeling of Stress : Not at all  Social Connections: Socially Integrated (04/06/2023)   Social Connection and Isolation Panel    Frequency of Communication with Friends and Family: More than three times a week    Frequency of Social Gatherings with Friends and Family: More than three times a week    Attends Religious Services: More than 4 times per year    Active Member of [employer] or  Organizations: Yes    Attends Engineer, structural: More than 4 times per year    Marital Status: Living with partner   Allergies  Allergen Reactions   Beta Adrenergic Blockers Shortness Of Breath and Other (See Comments)    Chest pain, dizziness   Candesartan  Cilexetil-Hctz     Dyspnea on exertion   Codeine Shortness Of Breath and Rash    Headache   Penicillins Shortness Of Breath and Rash    Headache Did it involve swelling of the face/tongue/throat, SOB, or low BP? Yes Did it involve sudden or severe rash/hives, skin peeling, or any reaction on the inside of your mouth or nose? No Did you need to seek medical attention at a hospital or doctor's office? Unknown When did it last happen?      childhood allergy If all above answers are "NO", may proceed with cephalosporin use.    Ace Inhibitors Cough   Combigan [Brimonidine Tartrate-Timolol]     Chest pain   Doxazosin      Unknown reaction   Other     Steroids - swelling, weight gain    Family History  Problem Relation Age of Onset   Colon cancer Mother    Emphysema Father    Aneurysm Brother    Stroke Brother    Colon polyps Daughter    Colon cancer Brother     Current Outpatient Medications (Endocrine & Metabolic):    levothyroxine  (SYNTHROID ) 75 MCG tablet, TAKE 1 TABLET(75 MCG) BY MOUTH DAILY   Current Outpatient Medications (Cardiovascular):    amLODipine  (NORVASC ) 5 MG tablet, Take 1 tablet (5 mg total) by mouth daily.     Current Outpatient Medications (Analgesics):    traMADol  (ULTRAM ) 50 MG tablet, Take 1 tablet (50 mg total) by mouth every 12 (twelve) hours as needed for up to 5 days.   aspirin  EC 81 MG tablet, Take by mouth.   rizatriptan  (MAXALT ) 10 MG tablet, Take 1 tablet (10 mg total) by mouth as needed for migraine. May repeat in 2 hours if needed     Current Outpatient Medications (Other):    Multiple Vitamins-Minerals (ICAPS AREDS 2 PO), Take 1 tablet by mouth 2 (two) times daily.    omeprazole  (PRILOSEC) 40 MG capsule, TAKE ONE CAPSULE BY MOUTH EVERY MORNING   OVER THE COUNTER MEDICATION, Apply 1 application topically daily as needed (pain). CBD cream  Current Facility-Administered Medications (Other):    ondansetron  (ZOFRAN -ODT) disintegrating tablet 4 mg   Reviewed prior external information including notes  and imaging from  primary care provider As well as notes that were available from care everywhere and other healthcare systems.  Past medical history, social, surgical and family history all reviewed in electronic medical record.  No pertanent information unless stated regarding to the chief complaint.   Review of Systems:  No headache, visual changes, nausea, vomiting, diarrhea, constipation, dizziness, abdominal pain, skin rash, fevers, chills, night sweats, weight loss, swollen lymph nodes, body aches, joint swelling, chest pain, shortness of breath, mood changes. POSITIVE muscle aches  Objective  Blood pressure 128/70, pulse 75, height 5' 2 (1.575 m), weight 156 lb (70.8 kg), SpO2 98%.   General: No apparent distress alert and oriented x3 mood and affect normal, dressed appropriately.  HEENT: Pupils equal, extraocular movements intact  Respiratory: Patient's speak in full sentences and does not appear short of breath  Cardiovascular: No lower extremity edema, non tender, no erythema  Left wrist swelling noted on exam today.  Limited range of motion secondary to tightness.  Patient neurovascularly intact distally.  Most of the swelling seems to be on the dorsal aspect of the wrist.  Limited muscular skeletal ultrasound was performed and interpreted by Vanessa Melton, M  Limited ultrasound shows that patient does have significant what appears to be more of a joint effusion.  Difficult to tell though if this is a vascular aneurysm noted.  Severe pain with compression. Impression: Likely irritation of underlying wrist arthritis but difficult to fully examine  with ultrasound   Impression and Recommendations:     The above documentation has been reviewed and is accurate and complete Vanessa Gartin M Cassadi Purdie, DO

## 2023-08-18 ENCOUNTER — Ambulatory Visit: Payer: Self-pay | Admitting: Family Medicine

## 2023-08-18 ENCOUNTER — Encounter: Payer: Self-pay | Admitting: Family Medicine

## 2023-08-19 ENCOUNTER — Other Ambulatory Visit: Payer: Self-pay | Admitting: Family Medicine

## 2023-08-19 DIAGNOSIS — E034 Atrophy of thyroid (acquired): Secondary | ICD-10-CM

## 2023-08-21 ENCOUNTER — Ambulatory Visit
Admission: RE | Admit: 2023-08-21 | Discharge: 2023-08-21 | Disposition: A | Discharge status: Home / Self Care | Source: Ambulatory Visit | Attending: Family Medicine | Admitting: Family Medicine

## 2023-08-21 DIAGNOSIS — M25532 Pain in left wrist: Secondary | ICD-10-CM

## 2023-08-21 DIAGNOSIS — M19042 Primary osteoarthritis, left hand: Secondary | ICD-10-CM | POA: Diagnosis not present

## 2023-08-21 DIAGNOSIS — M19032 Primary osteoarthritis, left wrist: Secondary | ICD-10-CM | POA: Diagnosis not present

## 2023-08-26 ENCOUNTER — Ambulatory Visit
Admission: RE | Admit: 2023-08-26 | Discharge: 2023-08-26 | Disposition: A | Discharge status: Home / Self Care | Source: Ambulatory Visit | Attending: Family Medicine | Admitting: Family Medicine

## 2023-08-26 DIAGNOSIS — M25532 Pain in left wrist: Secondary | ICD-10-CM | POA: Diagnosis not present

## 2023-08-26 MED ORDER — IOPAMIDOL (ISOVUE-370) INJECTION 76%
80.0000 mL | Freq: Once | INTRAVENOUS | Status: AC | PRN
  Administered 2023-08-26: 80 mL via INTRAVENOUS

## 2023-08-27 ENCOUNTER — Other Ambulatory Visit: Payer: Self-pay | Admitting: Medical Genetics

## 2023-08-27 NOTE — Progress Notes (Signed)
 [facility] [address] [place] [address] Phone: [phone] Subjective:   Vanessa Melton, am serving as a scribe for Dr. Arthea Claudene.  I'm seeing this patient by the request  of:  Vanessa Camie CROME, MD  CC: Left wrist pain  YEP:Dlagzrupcz  08/17/2023 Significant swelling of the joint noted.  We discussed potential aspiration but I do concern with patient's history of fibromuscular dysplasia I do feel we need further evaluation to make sure there is no significant dissection that is occurring.  I do think that there is a chance that there is an insufficiency fracture of the wrist as well but difficult to assess secondary to the osteopenia on the x-rays today.  Discussed with patient about icing regimen of home exercises.  Increase activity slowly.  Follow-up with me again after bracing for 2 weeks and getting the CT angiogram worsening pain to seek medical attention.  Tramadol  prescribed.     Update 08/31/2023 Vanessa Melton is a 89 y.o. female coming in with complaint of L wrist pain. Patient states that she is the same as last visit.     Past Medical History:  Diagnosis Date   Allergy    Aortic atherosclerosis (HCC) 05/03/2018   Arthritis    Atypical chest pain 05/03/2018   Cataract    GERD (gastroesophageal reflux disease)    Hypertension    Hypothyroidism    Mitral valve prolapse 05/03/2018   Nausea and vomiting 06/17/2022   Right knee pain 01/03/2015   Right wrist injury 01/07/2018   Past Surgical History:  Procedure Laterality Date   CATARACT EXTRACTION W/ INTRAOCULAR LENS IMPLANT     COSMETIC SURGERY     EYE MUSCLE SURGERY     EYE SURGERY     FRACTURE SURGERY     IR ANGIO INTRA EXTRACRAN SEL COM CAROTID INNOMINATE BILAT MOD SED  01/15/2023   IR ANGIO VERTEBRAL SEL VERTEBRAL BILAT MOD SED  01/15/2023   Social History   Socioeconomic History   Marital status: Significant Other    Spouse name: Not on file   Number of  children: 3   Years of education: 21   Highest education level: Doctorate  Occupational History   Occupation: retired professor    Comment: [employer]  Tobacco Use   Smoking status: Former    Current packs/day: 0.00    Types: Cigarettes    Start date: 02/04/1952    Quit date: 02/03/1957    Years since quitting: 66.6    Passive exposure: Past   Smokeless tobacco: Never   Tobacco comments:    no plan to start again  Vaping Use   Vaping status: Never Used  Substance and Sexual Activity   Alcohol use: Yes    Alcohol/week: 2.0 standard drinks of alcohol    Types: 2 Glasses of wine per week    Comment: social   Drug use: Yes    Types: Marijuana   Sexual activity: Yes    Birth control/protection: None  Other Topics Concern   Not on file  Social History Narrative   Significant other: Dr, Alm Mac   Home is split level, 6 stairs. Handrails on stairs, has smoke alarms and fire extinguisher.   Exercises 5+ days a week 1-1.5 hours per day   Golfs   Shows horses--Arabian female named BOGO (after buying the mare she got him free--buy one get one=BOGO)   All [address] [place]   Has an  adopted cat.   Travels frequently   Wears seat belts in vehicles.      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   Social Drivers of Health   Financial Resource Strain: Low Risk  (04/06/2023)   Overall Financial Resource Strain (CARDIA)    Difficulty of Paying Living Expenses: Not hard at all  Food Insecurity: No Food Insecurity (04/06/2023)   Hunger Vital Sign    Worried About Running Out of Food in the Last Year: Never true    Ran Out of Food in the Last Year: Never true  Transportation Needs: No Transportation Needs (04/06/2023)   PRAPARE - Administrator, Civil Service (Medical): No    Lack of Transportation (Non-Medical): No  Physical Activity: Sufficiently Active (04/06/2023)   Exercise Vital Sign    Days of Exercise per Week: 5 days    Minutes of Exercise per  Session: 60 min  Stress: No Stress Concern Present (04/06/2023)   [employer] of Occupational Health - Occupational Stress Questionnaire    Feeling of Stress : Not at all  Social Connections: Socially Integrated (04/06/2023)   Social Connection and Isolation Panel    Frequency of Communication with Friends and Family: More than three times a week    Frequency of Social Gatherings with Friends and Family: More than three times a week    Attends Religious Services: More than 4 times per year    Active Member of [employer] or Organizations: Yes    Attends Engineer, structural: More than 4 times per year    Marital Status: Living with partner   Allergies  Allergen Reactions   Beta Adrenergic Blockers Shortness Of Breath and Other (See Comments)    Chest pain, dizziness   Candesartan  Cilexetil-Hctz     Dyspnea on exertion   Codeine Shortness Of Breath and Rash    Headache   Penicillins Shortness Of Breath and Rash    Headache Did it involve swelling of the face/tongue/throat, SOB, or low BP? Yes Did it involve sudden or severe rash/hives, skin peeling, or any reaction on the inside of your mouth or nose? No Did you need to seek medical attention at a hospital or doctor's office? Unknown When did it last happen?      childhood allergy If all above answers are "NO", may proceed with cephalosporin use.    Ace Inhibitors Cough   Combigan [Brimonidine Tartrate-Timolol]     Chest pain   Doxazosin      Unknown reaction   Other     Steroids - swelling, weight gain    Family History  Problem Relation Age of Onset   Colon cancer Mother    Emphysema Father    Aneurysm Brother    Stroke Brother    Colon polyps Daughter    Colon cancer Brother     Current Outpatient Medications (Endocrine & Metabolic):    levothyroxine  (SYNTHROID ) 75 MCG tablet, TAKE ONE TABLET ONCE DAILY   Current Outpatient Medications (Cardiovascular):    amLODipine  (NORVASC ) 5 MG tablet, Take 1 tablet (5 mg  total) by mouth daily.     Current Outpatient Medications (Analgesics):    allopurinol  (ZYLOPRIM ) 100 MG tablet, Take 1 tablet (100 mg total) by mouth daily.   aspirin  EC 81 MG tablet, Take by mouth.   rizatriptan  (MAXALT ) 10 MG tablet, Take 1 tablet (10 mg total) by mouth as needed for migraine. May repeat in 2 hours if needed  Current Outpatient Medications (Other):    Multiple Vitamins-Minerals (ICAPS AREDS 2 PO), Take 1 tablet by mouth 2 (two) times daily.   omeprazole  (PRILOSEC) 40 MG capsule, TAKE ONE CAPSULE BY MOUTH EVERY MORNING   OVER THE COUNTER MEDICATION, Apply 1 application topically daily as needed (pain). CBD cream  Current Facility-Administered Medications (Other):    ondansetron  (ZOFRAN -ODT) disintegrating tablet 4 mg   Reviewed prior external information including notes and imaging from  primary care provider As well as notes that were available from care everywhere and other healthcare systems.  Past medical history, social, surgical and family history all reviewed in electronic medical record.  No pertanent information unless stated regarding to the chief complaint.   Review of Systems:  No headache, visual changes, nausea, vomiting, diarrhea, constipation, dizziness, abdominal pain, skin rash, fevers, chills, night sweats, weight loss, swollen lymph nodes, body aches, joint swelling, chest pain, shortness of breath, mood changes. POSITIVE muscle aches  Objective  Blood pressure 120/82, pulse 75, height 5' 2 (1.575 m), weight 153 lb (69.4 kg), SpO2 96%.   General: No apparent distress alert and oriented x3 mood and affect normal, dressed appropriately.  HEENT: Pupils equal, extraocular movements intact  Respiratory: Patient's speak in full sentences and does not appear short of breath  Cardiovascular: No lower extremity edema, non tender, no erythema   Wrist does have still some swelling noted.  Still some motion noted patient no warmth joint  Limited  muscular skeletal ultrasound was performed and interpreted by CLAUDENE HUSSAR, M   Limited ultrasound of patient's wrist still shows some hypoechoic changes coming from the scaphoid lunate joint it appears more than anywhere else.  Potential some swelling over the TFCC.  Increasing Doppler flow still noted in the vicinity. Impression: Continued arthritis with swelling of the wrist.   Impression and Recommendations:    The above documentation has been reviewed and is accurate and complete Aydon Swamy M Rabiah Goeser, DO

## 2023-08-31 ENCOUNTER — Other Ambulatory Visit: Payer: Self-pay

## 2023-08-31 ENCOUNTER — Ambulatory Visit: Admitting: Family Medicine

## 2023-08-31 ENCOUNTER — Encounter: Payer: Self-pay | Admitting: Family Medicine

## 2023-08-31 VITALS — BP 120/82 | HR 75 | Ht 62.0 in | Wt 153.0 lb

## 2023-08-31 DIAGNOSIS — M1120 Other chondrocalcinosis, unspecified site: Secondary | ICD-10-CM | POA: Diagnosis not present

## 2023-08-31 DIAGNOSIS — E538 Deficiency of other specified B group vitamins: Secondary | ICD-10-CM | POA: Insufficient documentation

## 2023-08-31 DIAGNOSIS — M19032 Primary osteoarthritis, left wrist: Secondary | ICD-10-CM | POA: Diagnosis not present

## 2023-08-31 DIAGNOSIS — M25532 Pain in left wrist: Secondary | ICD-10-CM | POA: Diagnosis not present

## 2023-08-31 MED ORDER — ALLOPURINOL 100 MG PO TABS: 100.0000 mg | ORAL_TABLET | Freq: Every day | ORAL | 0 refills | Status: AC

## 2023-08-31 MED ORDER — CYANOCOBALAMIN 1000 MCG/ML IJ SOLN
1000.0000 ug | Freq: Once | INTRAMUSCULAR | Status: AC
  Administered 2023-08-31: 1000 ug via INTRAMUSCULAR

## 2023-08-31 NOTE — Patient Instructions (Addendum)
 Allopuirnol 100mg  daily Tart cherry 3600mg  in pill form B12 injection today See me again in 1 month

## 2023-08-31 NOTE — Assessment & Plan Note (Signed)
 Injection given today.  Tolerated the procedure well.  May need to consider repeating monthly.

## 2023-08-31 NOTE — Assessment & Plan Note (Signed)
 Started allopurinol .  Warned of side effects.  Discussed the possibility of colchicine necessary.  Follow-up again 6 to 8 weeks otherwise

## 2023-08-31 NOTE — Assessment & Plan Note (Signed)
 Discussed again with patient.  Discussed with patient's of swelling still noted on the ultrasound.  Significant increasing in Doppler flow.  We discussed the possibility of injection but patient wants to avoid any type of steroid including injection at this point.  I discussed that on CT scan did show some signs and symptoms consistent with CPPD and discussed treatment with that including a low-dose of allopurinol .  Patient is to take this at the moment.  Warned of potential side effects.  I do believe it will be safe for her with her other comorbidities.  Patient will consider the possibility of injection if necessary at a later date but once again wants to avoid these injections and surgery.  Follow-up with me again 1 month

## 2023-09-01 ENCOUNTER — Other Ambulatory Visit (HOSPITAL_COMMUNITY)
Admission: RE | Admit: 2023-09-01 | Discharge: 2023-09-01 | Disposition: A | Discharge status: Home / Self Care | Payer: Self-pay | Source: Ambulatory Visit | Attending: Medical Genetics | Admitting: Medical Genetics

## 2023-09-08 ENCOUNTER — Other Ambulatory Visit: Payer: Self-pay | Admitting: Family Medicine

## 2023-09-12 LAB — GENECONNECT MOLECULAR SCREEN: Genetic Analysis Overall Interpretation: NEGATIVE

## 2023-09-23 ENCOUNTER — Encounter: Payer: Self-pay | Admitting: Family Medicine

## 2023-09-23 ENCOUNTER — Ambulatory Visit: Admitting: Family Medicine

## 2023-09-23 VITALS — BP 124/65 | HR 73 | Wt 155.0 lb

## 2023-09-23 DIAGNOSIS — E538 Deficiency of other specified B group vitamins: Secondary | ICD-10-CM

## 2023-09-23 DIAGNOSIS — I773 Arterial fibromuscular dysplasia: Secondary | ICD-10-CM | POA: Diagnosis not present

## 2023-09-23 DIAGNOSIS — G43109 Migraine with aura, not intractable, without status migrainosus: Secondary | ICD-10-CM

## 2023-09-23 DIAGNOSIS — M19032 Primary osteoarthritis, left wrist: Secondary | ICD-10-CM

## 2023-09-23 NOTE — Progress Notes (Signed)
   Discussed the use of AI scribe software for clinical note transcription with the patient, who gave verbal consent to proceed.  History of Present Illness   Vanessa Melton is an 89 year old female with fibromuscular dysplasia and severe osteoarthritis who presents for follow-up of wrist pain and ocular migraines.  Wrist pain and swelling - Severe wrist pain and swelling occurred overnight without known injury. - X-ray demonstrated missing joint space and cystic changes. - Allopurinol  was initially prescribed but discontinued due to diarrhea. - Wrist pain has improved and has not recurred after discontinuation of allopurinol . - History of prior trapezoid trapeziectomy and severe osteoarthritis at the triscaphe joint.  Ocular migraines - Persistent visual disturbances described as a 'big black blob' in vision, occasionally turning red. - No associated headaches. - Under neurologic care and awaiting further evaluation.  Fibromuscular dysplasia symptoms - Neck pain and 'whooshing' sounds in the ears during exertion. - Participation in clinical studies for fibromuscular dysplasia.  Nutritional deficiency and laboratory findings - Receives monthly B12 injections for low B12 levels. - Recent laboratory results showed slightly low potassium; other values were within normal limits.        PERTINENT  PMH / PSH: I have reviewed the patient's medications, allergies, past medical and surgical history, smoking status.  Pertinent findings that relate to today's visit / issues include:   Physical Exam   MEASUREMENTS: Weight- 88.     GENERAL: Well developed, well nourished and no acute distress. RESPIRATORY: respiratory rate and effort normal CARDIOVASCULAR: RRR. MSK: Normal gait, normal muscle bulk and tone.  PSYCH: Alert and oriented. Normal speech fluency and content. Asks and answers questions appropriately. No agitation noted. NEURO: no gross focal motor deficits are  noted.  Results   LABS Hb: 13 (09/17/2023) PLT: 200 (09/17/2023) Blood Glucose: 113 (09/17/2023) Potassium: Low (09/17/2023)  RADIOLOGY Wrist X-ray: Decreased joint space, sclerotic bones, cystic change in scaphoid bone, consistent with arthritis Wrist CT: Cystic changes consistent with arthritis MRI: Unremarkable (09/17/2022)  DIAGNOSTIC Angiogram: Patent arteries, no aneurysm, unremarkable thyroid  EEG: Normal EKG: Normal (09/27/2022)       Assessment and Plan     Severe osteoarthritis of left wrist Severe osteoarthritis with cystic changes in the scaphoid bone, missing joint space, and sclerosis on X-ray. Recent flare-up of pain and swelling. Allopurinol  discontinued due to diarrhea, wrist pain did not return. - Consider restarting allopurinol  at lower dose if pain returns, monitor for diarrhea. - Taper off allopurinol  if diarrhea occurs.  Fibromuscular dysplasia involving neck and kidneys Fibromuscular dysplasia in neck and kidneys, low risk of progression unless arterial insult occurs. Neck discomfort and tinnitus possibly related. - Continue aspirin  therapy. - Order CT scan of head and neck for new headaches. - Refer to neurologist for headache and migraine management. - Provide educational materials for first-degree relatives. - Advise chair yoga, avoid excessive neck stretching. - Check lipid panel, ESR, CRP. - Schedule neurologist appointment after CT scan.  Ocular migraines Ocular migraines with visual disturbances, managed by neurologist. - Continue follow-up with neurologist for management. - Attend appointment with Doctor Tobie on September 11.

## 2023-09-30 NOTE — Progress Notes (Unsigned)
 [facility] [address] [place] [address] Phone: [phone] Subjective:   Vanessa Melton Vanessa Melton, am serving as a scribe for Dr. Arthea Claudene.  I'm seeing this patient by the request  of:  Rosalynn Camie CROME, MD  CC: Wrist pain follow-up  YEP:Dlagzrupcz  08/31/2023 Injection given today.  Tolerated the procedure well.  May need to consider repeating monthly.     Started allopurinol . Warned of side effects. Discussed the possibility of colchicine necessary. Follow-up again 6 to 8 weeks otherwise   Discussed again with patient.  Discussed with patient's of swelling still noted on the ultrasound.  Significant increasing in Doppler flow.  We discussed the possibility of injection but patient wants to avoid any type of steroid including injection at this point.  I discussed that on CT scan did show some signs and symptoms consistent with CPPD and discussed treatment with that including a low-dose of allopurinol .  Patient is to take this at the moment.  Warned of potential side effects.  I do believe it will be safe for her with her other comorbidities.  Patient will consider the possibility of injection if necessary at a later date but once again wants to avoid these injections and surgery.  Follow-up with me again 1 month      Update 10/01/2023 Vanessa Melton is a 89 y.o. female coming in with complaint of L wrist pain.  Found to have gouty deposits.  Arthritic changes of the wrist.  Started on allopurinol  and given injection.  Patient was unable to tolerate the allopurinol  and discontinued it secondary to GI upset.  Patient states that she is doing much better.     Past Medical History:  Diagnosis Date   Allergy    Aortic atherosclerosis (HCC) 05/03/2018   Arthritis    Atypical chest pain 05/03/2018   Cataract    GERD (gastroesophageal reflux disease)    Hypertension    Hypothyroidism    Mitral valve prolapse 05/03/2018   Nausea and vomiting 06/17/2022    Right knee pain 01/03/2015   Right wrist injury 01/07/2018   Past Surgical History:  Procedure Laterality Date   CATARACT EXTRACTION W/ INTRAOCULAR LENS IMPLANT     COSMETIC SURGERY     EYE MUSCLE SURGERY     EYE SURGERY     FRACTURE SURGERY     IR ANGIO INTRA EXTRACRAN SEL COM CAROTID INNOMINATE BILAT MOD SED  01/15/2023   IR ANGIO VERTEBRAL SEL VERTEBRAL BILAT MOD SED  01/15/2023   Social History   Socioeconomic History   Marital status: Significant Other    Spouse name: Not on file   Number of children: 3   Years of education: 21   Highest education level: Doctorate  Occupational History   Occupation: retired professor    Comment: [employer]  Tobacco Use   Smoking status: Former    Current packs/day: 0.00    Types: Cigarettes    Start date: 02/04/1952    Quit date: 02/03/1957    Years since quitting: 66.7    Passive exposure: Past   Smokeless tobacco: Never   Tobacco comments:    no plan to start again  Vaping Use   Vaping status: Never Used  Substance and Sexual Activity   Alcohol use: Yes    Alcohol/week: 2.0 standard drinks of alcohol    Types: 2 Glasses of wine per week    Comment: social   Drug use: Yes  Types: Marijuana   Sexual activity: Yes    Birth control/protection: None  Other Topics Concern   Not on file  Social History Narrative   Significant other: Dr, Alm Mac   Home is split level, 6 stairs. Handrails on stairs, has smoke alarms and fire extinguisher.   Exercises 5+ days a week 1-1.5 hours per day   Golfs   Shows horses--Arabian female named BOGO (after buying the mare she got him free--buy one get one=BOGO)   [address] [place]   Has an adopted Medical laboratory scientific officer.   Travels frequently   Wears seat belts in vehicles.      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   Social Drivers of Health   Financial Resource Strain: Low Risk  (04/06/2023)   Overall Financial Resource Strain (CARDIA)    Difficulty of Paying Living  Expenses: Not hard at all  Food Insecurity: No Food Insecurity (04/06/2023)   Hunger Vital Sign    Worried About Running Out of Food in the Last Year: Never true    Ran Out of Food in the Last Year: Never true  Transportation Needs: No Transportation Needs (04/06/2023)   PRAPARE - Administrator, Civil Service (Medical): No    Lack of Transportation (Non-Medical): No  Physical Activity: Sufficiently Active (04/06/2023)   Exercise Vital Sign    Days of Exercise per Week: 5 days    Minutes of Exercise per Session: 60 min  Stress: No Stress Concern Present (04/06/2023)   [employer] of Occupational Health - Occupational Stress Questionnaire    Feeling of Stress : Not at all  Social Connections: Socially Integrated (04/06/2023)   Social Connection and Isolation Panel    Frequency of Communication with Friends and Family: More than three times a week    Frequency of Social Gatherings with Friends and Family: More than three times a week    Attends Religious Services: More than 4 times per year    Active Member of [employer] or Organizations: Yes    Attends Engineer, structural: More than 4 times per year    Marital Status: Living with partner   Allergies  Allergen Reactions   Beta Adrenergic Blockers Shortness Of Breath and Other (See Comments)    Chest pain, dizziness   Candesartan  Cilexetil-Hctz     Dyspnea on exertion   Codeine Shortness Of Breath and Rash    Headache   Penicillins Shortness Of Breath and Rash    Headache Did it involve swelling of the face/tongue/throat, SOB, or low BP? Yes Did it involve sudden or severe rash/hives, skin peeling, or any reaction on the inside of your mouth or nose? No Did you need to seek medical attention at a hospital or doctor's office? Unknown When did it last happen?      childhood allergy If all above answers are "NO", may proceed with cephalosporin use.    Ace Inhibitors Cough   Combigan [Brimonidine Tartrate-Timolol]      Chest pain   Doxazosin      Unknown reaction   Other     Steroids - swelling, weight gain    Family History  Problem Relation Age of Onset   Colon cancer Mother    Emphysema Father    Aneurysm Brother    Stroke Brother    Colon polyps Daughter    Colon cancer Brother     Current Outpatient Medications (Endocrine & Metabolic):    levothyroxine  (SYNTHROID ) 75 MCG tablet,  TAKE ONE TABLET ONCE DAILY   Current Outpatient Medications (Cardiovascular):    amLODipine  (NORVASC ) 5 MG tablet, Take 1 tablet (5 mg total) by mouth daily.   indapamide  (LOZOL ) 1.25 MG tablet, TAKE ONE TABLET BY MOUTH EVERY EVENING     Current Outpatient Medications (Analgesics):    allopurinol  (ZYLOPRIM ) 100 MG tablet, Take 1 tablet (100 mg total) by mouth daily.   aspirin  EC 81 MG tablet, Take by mouth.   rizatriptan  (MAXALT ) 10 MG tablet, Take 1 tablet (10 mg total) by mouth as needed for migraine. May repeat in 2 hours if needed     Current Outpatient Medications (Other):    Multiple Vitamins-Minerals (ICAPS AREDS 2 PO), Take 1 tablet by mouth 2 (two) times daily.   omeprazole  (PRILOSEC) 40 MG capsule, TAKE ONE CAPSULE BY MOUTH EVERY MORNING   OVER THE COUNTER MEDICATION, Apply 1 application topically daily as needed (pain). CBD cream  Current Facility-Administered Medications (Other):    ondansetron  (ZOFRAN -ODT) disintegrating tablet 4 mg   Reviewed prior external information including notes and imaging from  primary care provider As well as notes that were available from care everywhere and other healthcare systems.  Past medical history, social, surgical and family history all reviewed in electronic medical record.  No pertanent information unless stated regarding to the chief complaint.   Review of Systems:  No headache, visual changes, nausea, vomiting, diarrhea, constipation, dizziness, abdominal pain, skin rash, fevers, chills, night sweats, weight loss, swollen lymph nodes, body aches,  joint swelling, chest pain, shortness of breath, mood changes. POSITIVE muscle aches  Objective  Blood pressure 132/82, pulse 70, height 5' 2 (1.575 m), weight 154 lb (69.9 kg), SpO2 96%.   General: No apparent distress alert and oriented x3 mood and affect normal, dressed appropriately.  HEENT: Pupils equal, extraocular movements intact  Respiratory: Patient's speak in full sentences and does not appear short of breath  Cardiovascular: No lower extremity edema, non tender, no erythema  Wrist exam shows patient still has some arthritic changes noted.  Significant decrease in the swelling noted and significant improvement in the range of motion.   Limited muscular skeletal ultrasound was performed and interpreted by CLAUDENE HUSSAR, M  Limited ultrasound shows the patient does have significant decrease in hypoechoic changes of the arthritic changes noted of the wrist. Impression: Interval improvement   Impression and Recommendations:     The above documentation has been reviewed and is accurate and complete Percy Winterrowd M Keylor Rands, DO

## 2023-10-01 ENCOUNTER — Encounter: Payer: Self-pay | Admitting: Family Medicine

## 2023-10-01 ENCOUNTER — Ambulatory Visit: Admitting: Family Medicine

## 2023-10-01 ENCOUNTER — Other Ambulatory Visit: Payer: Self-pay

## 2023-10-01 VITALS — BP 132/82 | HR 70 | Ht 62.0 in | Wt 154.0 lb

## 2023-10-01 DIAGNOSIS — E538 Deficiency of other specified B group vitamins: Secondary | ICD-10-CM

## 2023-10-01 DIAGNOSIS — M1120 Other chondrocalcinosis, unspecified site: Secondary | ICD-10-CM | POA: Diagnosis not present

## 2023-10-01 DIAGNOSIS — M19032 Primary osteoarthritis, left wrist: Secondary | ICD-10-CM

## 2023-10-01 DIAGNOSIS — M25532 Pain in left wrist: Secondary | ICD-10-CM | POA: Diagnosis not present

## 2023-10-01 MED ORDER — CYANOCOBALAMIN 1000 MCG/ML IJ SOLN
1000.0000 ug | Freq: Once | INTRAMUSCULAR | Status: AC
  Administered 2023-10-01: 1000 ug via INTRAMUSCULAR

## 2023-10-01 NOTE — Assessment & Plan Note (Signed)
 Arthritis of the wrist noted.  Discussed icing regimen and home exercises, discussed which activities to do and which ones to avoid.  Increase activity slowly.  Discussed icing regimen.  Follow-up again in 6 to 8 weeks.  She is

## 2023-10-01 NOTE — Patient Instructions (Signed)
 Great to see you B12 today Wrist looks much better 3600mg  of [employer]

## 2023-10-01 NOTE — Assessment & Plan Note (Addendum)
 Doing well with tart cherry.  Difficulty with allopurinol  can change to uloric if needed RTC PRN

## 2023-10-01 NOTE — Assessment & Plan Note (Signed)
 B12 injection given today, tolerated the procedure well will come in monthly hereafter.

## 2023-10-01 NOTE — Addendum Note (Signed)
 Addended by: GEROME ASHLEY SAUNDERS on: 10/01/2023 10:48 AM   Modules accepted: Orders

## 2023-10-06 DIAGNOSIS — L820 Inflamed seborrheic keratosis: Secondary | ICD-10-CM | POA: Diagnosis not present

## 2023-10-06 DIAGNOSIS — L821 Other seborrheic keratosis: Secondary | ICD-10-CM | POA: Diagnosis not present

## 2023-10-06 DIAGNOSIS — L570 Actinic keratosis: Secondary | ICD-10-CM | POA: Diagnosis not present

## 2023-10-06 DIAGNOSIS — L814 Other melanin hyperpigmentation: Secondary | ICD-10-CM | POA: Diagnosis not present

## 2023-10-15 DIAGNOSIS — D3132 Benign neoplasm of left choroid: Secondary | ICD-10-CM | POA: Diagnosis not present

## 2023-10-15 DIAGNOSIS — H401112 Primary open-angle glaucoma, right eye, moderate stage: Secondary | ICD-10-CM | POA: Diagnosis not present

## 2023-10-15 DIAGNOSIS — H353131 Nonexudative age-related macular degeneration, bilateral, early dry stage: Secondary | ICD-10-CM | POA: Diagnosis not present

## 2023-10-15 DIAGNOSIS — H35373 Puckering of macula, bilateral: Secondary | ICD-10-CM | POA: Diagnosis not present

## 2023-10-15 DIAGNOSIS — H35363 Drusen (degenerative) of macula, bilateral: Secondary | ICD-10-CM | POA: Diagnosis not present

## 2023-10-21 ENCOUNTER — Encounter: Payer: Self-pay | Admitting: Family Medicine

## 2023-10-29 ENCOUNTER — Other Ambulatory Visit: Payer: Self-pay | Admitting: Family Medicine

## 2023-10-29 DIAGNOSIS — M545 Low back pain, unspecified: Secondary | ICD-10-CM

## 2023-11-09 ENCOUNTER — Ambulatory Visit (INDEPENDENT_AMBULATORY_CARE_PROVIDER_SITE_OTHER)

## 2023-11-09 DIAGNOSIS — E538 Deficiency of other specified B group vitamins: Secondary | ICD-10-CM | POA: Diagnosis not present

## 2023-11-09 MED ORDER — CYANOCOBALAMIN 1000 MCG/ML IJ SOLN
1000.0000 ug | Freq: Once | INTRAMUSCULAR | Status: AC
  Administered 2023-11-09: 1000 ug via INTRAMUSCULAR

## 2023-11-09 NOTE — Progress Notes (Signed)
 Patient received B12 1000 mcg/ml in right arm. Patient tolerated well.

## 2023-11-10 DIAGNOSIS — L578 Other skin changes due to chronic exposure to nonionizing radiation: Secondary | ICD-10-CM | POA: Diagnosis not present

## 2023-11-10 DIAGNOSIS — L820 Inflamed seborrheic keratosis: Secondary | ICD-10-CM | POA: Diagnosis not present

## 2023-11-11 DIAGNOSIS — M5451 Vertebrogenic low back pain: Secondary | ICD-10-CM | POA: Diagnosis not present

## 2023-11-17 DIAGNOSIS — D3132 Benign neoplasm of left choroid: Secondary | ICD-10-CM | POA: Diagnosis not present

## 2023-11-17 DIAGNOSIS — H35363 Drusen (degenerative) of macula, bilateral: Secondary | ICD-10-CM | POA: Diagnosis not present

## 2023-11-19 DIAGNOSIS — H401131 Primary open-angle glaucoma, bilateral, mild stage: Secondary | ICD-10-CM | POA: Diagnosis not present

## 2023-11-19 DIAGNOSIS — Z961 Presence of intraocular lens: Secondary | ICD-10-CM | POA: Diagnosis not present

## 2023-11-19 DIAGNOSIS — D3132 Benign neoplasm of left choroid: Secondary | ICD-10-CM | POA: Diagnosis not present

## 2023-12-11 ENCOUNTER — Ambulatory Visit

## 2023-12-11 DIAGNOSIS — E538 Deficiency of other specified B group vitamins: Secondary | ICD-10-CM | POA: Diagnosis not present

## 2023-12-11 MED ORDER — CYANOCOBALAMIN 1000 MCG/ML IJ SOLN
1000.0000 ug | Freq: Once | INTRAMUSCULAR | Status: AC
  Administered 2023-12-11: 1000 ug via INTRAMUSCULAR

## 2023-12-11 NOTE — Progress Notes (Signed)
 Patient received B12 1000 ml in right arm. Patient tolerated well.

## 2023-12-29 ENCOUNTER — Other Ambulatory Visit (HOSPITAL_COMMUNITY): Payer: Self-pay

## 2023-12-29 DIAGNOSIS — G43109 Migraine with aura, not intractable, without status migrainosus: Secondary | ICD-10-CM | POA: Diagnosis not present

## 2023-12-29 DIAGNOSIS — H5462 Unqualified visual loss, left eye, normal vision right eye: Secondary | ICD-10-CM | POA: Diagnosis not present

## 2023-12-29 MED ORDER — QULIPTA 60 MG PO TABS
30.0000 mg | ORAL_TABLET | Freq: Every day | ORAL | 11 refills | Status: AC
  Filled 2023-12-29: qty 30, 60d supply, fill #0

## 2023-12-30 ENCOUNTER — Other Ambulatory Visit (HOSPITAL_COMMUNITY): Payer: Self-pay

## 2024-01-04 ENCOUNTER — Telehealth: Payer: Self-pay

## 2024-01-04 NOTE — Telephone Encounter (Signed)
 Received call from patient regarding upcoming MRI. Patient's neurologist wants her to receive brain MRI as soon as possible. She is scheduled for this Thursday, 01/07/24 at imaging center in [place].   Patient reports history of claustrophobia during these type of exams. She reports being prescribed Valium  in the past to take prior to appointment.   If appropriate, patient is requesting rx to be sent to [facility].   Please advise.   Vanessa JAYSON English, RN

## 2024-01-05 MED ORDER — DIAZEPAM 5 MG PO TABS: ORAL_TABLET | ORAL | 0 refills | Status: AC

## 2024-01-11 ENCOUNTER — Ambulatory Visit (INDEPENDENT_AMBULATORY_CARE_PROVIDER_SITE_OTHER)

## 2024-01-11 ENCOUNTER — Ambulatory Visit

## 2024-01-11 DIAGNOSIS — E538 Deficiency of other specified B group vitamins: Secondary | ICD-10-CM

## 2024-01-11 MED ORDER — CYANOCOBALAMIN 1000 MCG/ML IJ SOLN
1000.0000 ug | Freq: Once | INTRAMUSCULAR | Status: AC
  Administered 2024-01-11: 1000 ug via INTRAMUSCULAR

## 2024-01-11 NOTE — Progress Notes (Signed)
 Patient received B12 1000 mcg/ml in right arm. Patient tolerated well.

## 2024-02-11 ENCOUNTER — Ambulatory Visit (INDEPENDENT_AMBULATORY_CARE_PROVIDER_SITE_OTHER)

## 2024-02-11 DIAGNOSIS — E538 Deficiency of other specified B group vitamins: Secondary | ICD-10-CM | POA: Diagnosis not present

## 2024-02-11 MED ORDER — CYANOCOBALAMIN 1000 MCG/ML IJ SOLN
1000.0000 ug | Freq: Once | INTRAMUSCULAR | Status: AC
  Administered 2024-02-11: 1000 ug via INTRAMUSCULAR

## 2024-02-11 NOTE — Progress Notes (Signed)
 Patient received B12 in right arm. Patient tolerated well.

## 2024-02-27 ENCOUNTER — Other Ambulatory Visit: Payer: Self-pay | Admitting: Family Medicine

## 2024-03-14 ENCOUNTER — Ambulatory Visit

## 2024-04-07 ENCOUNTER — Encounter
